# Patient Record
Sex: Female | Born: 1950 | Race: White | Hispanic: No | Smoking: Never smoker
Health system: Southern US, Community
[De-identification: ages and names within clinical notes are randomized; demographics above are authoritative.]

## PROBLEM LIST (undated history)

## (undated) DIAGNOSIS — T7840XA Allergy, unspecified, initial encounter: Secondary | ICD-10-CM

## (undated) DIAGNOSIS — E785 Hyperlipidemia, unspecified: Secondary | ICD-10-CM

## (undated) DIAGNOSIS — M1990 Unspecified osteoarthritis, unspecified site: Secondary | ICD-10-CM

## (undated) DIAGNOSIS — I10 Essential (primary) hypertension: Secondary | ICD-10-CM

## (undated) HISTORY — DX: Allergy, unspecified, initial encounter: T78.40XA

## (undated) HISTORY — DX: Essential (primary) hypertension: I10

## (undated) HISTORY — PX: FRACTURE SURGERY: SHX138

## (undated) HISTORY — PX: TUBAL LIGATION: SHX77

## (undated) HISTORY — DX: Hyperlipidemia, unspecified: E78.5

## (undated) HISTORY — DX: Unspecified osteoarthritis, unspecified site: M19.90

---

## 2004-09-01 ENCOUNTER — Ambulatory Visit: Payer: Self-pay | Admitting: Obstetrics and Gynecology

## 2005-07-02 ENCOUNTER — Ambulatory Visit: Payer: Self-pay | Admitting: Unknown Physician Specialty

## 2005-09-22 ENCOUNTER — Ambulatory Visit: Payer: Self-pay | Admitting: Obstetrics and Gynecology

## 2006-08-23 ENCOUNTER — Encounter: Payer: Self-pay | Admitting: Internal Medicine

## 2006-09-07 ENCOUNTER — Encounter: Payer: Self-pay | Admitting: Internal Medicine

## 2006-09-27 ENCOUNTER — Ambulatory Visit: Payer: Self-pay | Admitting: Obstetrics and Gynecology

## 2007-09-29 ENCOUNTER — Ambulatory Visit: Payer: Self-pay | Admitting: Obstetrics and Gynecology

## 2008-08-21 HISTORY — PX: COLONOSCOPY: SHX174

## 2008-10-01 ENCOUNTER — Ambulatory Visit: Payer: Self-pay | Admitting: Obstetrics and Gynecology

## 2008-10-22 ENCOUNTER — Ambulatory Visit: Payer: Self-pay | Admitting: Unknown Physician Specialty

## 2009-10-14 ENCOUNTER — Ambulatory Visit: Payer: Self-pay | Admitting: Obstetrics and Gynecology

## 2010-10-16 ENCOUNTER — Ambulatory Visit: Payer: Self-pay | Admitting: Obstetrics and Gynecology

## 2011-12-09 ENCOUNTER — Ambulatory Visit: Payer: Self-pay | Admitting: Family Medicine

## 2012-12-13 ENCOUNTER — Ambulatory Visit: Payer: Self-pay | Admitting: Family Medicine

## 2013-12-03 ENCOUNTER — Ambulatory Visit: Payer: Self-pay

## 2013-12-08 ENCOUNTER — Ambulatory Visit: Payer: Self-pay | Admitting: Family Medicine

## 2013-12-20 ENCOUNTER — Ambulatory Visit: Payer: Self-pay | Admitting: Family Medicine

## 2014-03-21 ENCOUNTER — Emergency Department: Payer: Self-pay | Admitting: Emergency Medicine

## 2014-03-21 LAB — COMPREHENSIVE METABOLIC PANEL
ALBUMIN: 3.8 g/dL (ref 3.4–5.0)
ALT: 29 U/L
Alkaline Phosphatase: 128 U/L — ABNORMAL HIGH
Anion Gap: 6 — ABNORMAL LOW (ref 7–16)
BUN: 13 mg/dL (ref 7–18)
Bilirubin,Total: 0.5 mg/dL (ref 0.2–1.0)
CREATININE: 1.03 mg/dL (ref 0.60–1.30)
Calcium, Total: 9.2 mg/dL (ref 8.5–10.1)
Chloride: 109 mmol/L — ABNORMAL HIGH (ref 98–107)
Co2: 26 mmol/L (ref 21–32)
EGFR (African American): >60
EGFR (Non-African Amer.): 58 — ABNORMAL LOW
Glucose: 110 mg/dL — ABNORMAL HIGH (ref 65–99)
Osmolality: 282 (ref 275–301)
Potassium: 3.9 mmol/L (ref 3.5–5.1)
SGOT(AST): 27 U/L (ref 15–37)
SODIUM: 141 mmol/L (ref 136–145)
Total Protein: 7.5 g/dL (ref 6.4–8.2)

## 2014-03-21 LAB — CBC
HCT: 42.9 % (ref 35.0–47.0)
HGB: 14 g/dL (ref 12.0–16.0)
MCH: 30.2 pg (ref 26.0–34.0)
MCHC: 32.7 g/dL (ref 32.0–36.0)
MCV: 93 fL (ref 80–100)
Platelet: 229 10*3/uL (ref 150–440)
RBC: 4.64 10*6/uL (ref 3.80–5.20)
RDW: 13.2 % (ref 11.5–14.5)
WBC: 7.6 10*3/uL (ref 3.6–11.0)

## 2014-06-23 ENCOUNTER — Emergency Department
Admit: 2014-06-23 | Disposition: A | Discharge status: Home / Self Care | Payer: Self-pay | Admitting: Emergency Medicine

## 2014-06-23 LAB — COMPREHENSIVE METABOLIC PANEL
ALBUMIN: 4.4 g/dL
ANION GAP: 5 — AB (ref 7–16)
Alkaline Phosphatase: 128 U/L — ABNORMAL HIGH
BUN: 16 mg/dL
Bilirubin,Total: 0.6 mg/dL
CHLORIDE: 107 mmol/L
CREATININE: 0.93 mg/dL
Calcium, Total: 9.3 mg/dL
Co2: 26 mmol/L
EGFR (African American): >60
EGFR (Non-African Amer.): >60
GLUCOSE: 120 mg/dL — AB
Potassium: 3.7 mmol/L
SGOT(AST): 28 U/L
SGPT (ALT): 31 U/L
SODIUM: 138 mmol/L
TOTAL PROTEIN: 7.9 g/dL

## 2014-06-23 LAB — CBC
HCT: 41.9 % (ref 35.0–47.0)
HGB: 14.1 g/dL (ref 12.0–16.0)
MCH: 30.1 pg (ref 26.0–34.0)
MCHC: 33.6 g/dL (ref 32.0–36.0)
MCV: 90 fL (ref 80–100)
PLATELETS: 259 10*3/uL (ref 150–440)
RBC: 4.67 10*6/uL (ref 3.80–5.20)
RDW: 13.3 % (ref 11.5–14.5)
WBC: 7.9 10*3/uL (ref 3.6–11.0)

## 2014-06-23 LAB — TROPONIN I

## 2014-10-17 ENCOUNTER — Ambulatory Visit: Payer: Self-pay | Admitting: Family Medicine

## 2014-10-24 ENCOUNTER — Encounter: Payer: Self-pay | Admitting: Family Medicine

## 2014-10-24 ENCOUNTER — Ambulatory Visit (INDEPENDENT_AMBULATORY_CARE_PROVIDER_SITE_OTHER): Payer: Managed Care, Other (non HMO) | Admitting: Family Medicine

## 2014-10-24 ENCOUNTER — Other Ambulatory Visit: Payer: Self-pay

## 2014-10-24 VITALS — BP 164/90 | HR 68 | Ht 64.0 in | Wt 178.0 lb

## 2014-10-24 DIAGNOSIS — R55 Syncope and collapse: Secondary | ICD-10-CM

## 2014-10-24 DIAGNOSIS — Z823 Family history of stroke: Secondary | ICD-10-CM | POA: Diagnosis not present

## 2014-10-24 DIAGNOSIS — Z8249 Family history of ischemic heart disease and other diseases of the circulatory system: Secondary | ICD-10-CM | POA: Diagnosis not present

## 2014-10-24 DIAGNOSIS — R739 Hyperglycemia, unspecified: Secondary | ICD-10-CM | POA: Diagnosis not present

## 2014-10-24 DIAGNOSIS — E669 Obesity, unspecified: Secondary | ICD-10-CM | POA: Diagnosis not present

## 2014-10-24 DIAGNOSIS — E785 Hyperlipidemia, unspecified: Secondary | ICD-10-CM

## 2014-10-24 DIAGNOSIS — I10 Essential (primary) hypertension: Secondary | ICD-10-CM | POA: Diagnosis not present

## 2014-10-24 DIAGNOSIS — E782 Mixed hyperlipidemia: Secondary | ICD-10-CM | POA: Insufficient documentation

## 2014-10-24 HISTORY — DX: Syncope and collapse: R55

## 2014-10-24 MED ORDER — METOPROLOL SUCCINATE ER 100 MG PO TB24: 100.0000 mg | ORAL_TABLET | Freq: Every day | ORAL | Status: DC

## 2014-10-24 NOTE — Progress Notes (Signed)
Date:  10/24/2014   Name:  Heidi Powell   DOB:  [birth year]   MRN:  [id number]  PCP:  Adline Potter, MD    Chief Complaint: Establish Care   History of Present Illness:  This is a 64 y.o. female with HTN and HLD for establishment of care. Has been seeing Dr. Clemmie Krill but not covered under her insurance now. Hx HTN switched from losartan to metoprolol 6 months ago due to poor control. Hx vasovagal syncope for years, has had extensive negative cardiac w/u, only one episode since switch to metoprolol. Has taken Lipitor for many years for borderline HLD, last lipids 06/2014 ok. FH CAD/CHF in father, HTNCAD in mother, fatal CVA in older brother.  Review of Systems:  Review of Systems  Constitutional: Negative for fever and fatigue.  HENT: Negative for ear pain and sore throat.   Eyes: Negative for pain.  Respiratory: Negative for cough and shortness of breath.   Cardiovascular: Negative for chest pain and leg swelling.  Gastrointestinal: Negative for abdominal pain, diarrhea and constipation.  Endocrine: Negative for polyuria.  Genitourinary: Negative for flank pain, difficulty urinating and pelvic pain.  Musculoskeletal: Negative for back pain and neck pain.  Skin: Negative for rash.  Neurological: Negative for tremors and weakness.  Hematological: Negative for adenopathy.  Psychiatric/Behavioral: Negative for confusion, dysphoric mood and agitation.    Patient Active Problem List   Diagnosis Date Noted  . HLD (hyperlipidemia) 10/24/2014  . BP (high blood pressure) 10/24/2014  . Vasovagal syncope 10/24/2014  . Obesity (BMI 30.0-34.9) 10/24/2014    Prior to Admission medications   Medication Sig Start Date End Date Taking? Authorizing Provider  atorvastatin (LIPITOR) 10 MG tablet Take 1 tablet by mouth daily. 10/16/14  Yes Historical Provider, MD  metoprolol succinate (TOPROL XL) 100 MG 24 hr tablet Take 1 tablet (100 mg total) by mouth daily. Take with or immediately following a  meal. 10/24/14   Adline Potter, MD    Allergies  Allergen Reactions  . Penicillin V Potassium Rash    No past surgical history on file.  Social History  Substance Use Topics  . Smoking status: Never Smoker   . Smokeless tobacco: Not on file  . Alcohol Use: 0.0 oz/week    0 Standard drinks or equivalent per week    Family History  Problem Relation Age of Onset  . Hypertension Mother   . Heart disease Father   . Hypertension Father   . Stroke Brother     Medication list has been reviewed and updated.  Physical Examination: BP 164/90 mmHg  Pulse 68  Ht 5\' 4"  (1.626 m)  Wt 178 lb (80.74 kg)  BMI 30.54 kg/m2  Physical Exam  Constitutional: She is oriented to person, place, and time. She appears well-developed and well-nourished.  HENT:  Head: Normocephalic and atraumatic.  Right Ear: External ear normal.  Left Ear: External ear normal.  Nose: Nose normal.  Mouth/Throat: Oropharynx is clear and moist.  Eyes: Conjunctivae and EOM are normal. Pupils are equal, round, and reactive to light. No scleral icterus.  Neck: Neck supple. No thyromegaly present.  Cardiovascular: Normal rate, regular rhythm and normal heart sounds.  Exam reveals no gallop.   No murmur heard. Pulmonary/Chest: Effort normal and breath sounds normal. She has no wheezes. She has no rales.  Abdominal: Soft. She exhibits no distension and no mass. There is no tenderness.  Musculoskeletal: Normal range of motion. She exhibits no edema.  Lymphadenopathy:    She  has no cervical adenopathy.  Neurological: She is alert and oriented to person, place, and time. Coordination normal.  Skin: Skin is warm and dry. No rash noted.  Psychiatric: She has a normal mood and affect. Her behavior is normal.    Assessment and Plan:  1. Essential hypertension Poor control, increase metoprolol succinate from 50 to 100 mg daily - Comprehensive metabolic panel - CBC  2. Vasovagal syncope S/p extensive negative cardiac  w/u, less frequent since switch to metoprolol  3. HLD (hyperlipidemia) Unclear control, FH CAD/stroke - Lipid Profile  4. Obesity (BMI 30.0-34.9) Discussed weight loss/exercise - TSH - Vitamin D (25 hydroxy)  5. Hyperglycemia On blood work 06/2014 - HgB A1c  Return in about 4 weeks (around 11/21/2014).  Satira Anis. [facility]  10/24/2014

## 2014-10-30 NOTE — Progress Notes (Signed)
Tried to call patient and no answer or answering machine.dr

## 2014-10-31 NOTE — Progress Notes (Signed)
Please call regarding overdue labs

## 2014-11-07 LAB — COMPREHENSIVE METABOLIC PANEL
A/G RATIO: 1.8 (ref 1.1–2.5)
ALBUMIN: 4.7 g/dL (ref 3.6–4.8)
ALK PHOS: 146 IU/L — AB (ref 39–117)
ALT: 19 IU/L (ref 0–32)
AST: 21 IU/L (ref 0–40)
BILIRUBIN TOTAL: 0.4 mg/dL (ref 0.0–1.2)
BUN / CREAT RATIO: 14 (ref 11–26)
BUN: 14 mg/dL (ref 8–27)
CHLORIDE: 100 mmol/L (ref 97–108)
CO2: 21 mmol/L (ref 18–29)
Calcium: 9.6 mg/dL (ref 8.7–10.3)
Creatinine, Ser: 0.99 mg/dL (ref 0.57–1.00)
GFR calc Af Amer: 71 mL/min/{1.73_m2} (ref >59)
GFR calc non Af Amer: 61 mL/min/{1.73_m2} (ref >59)
GLUCOSE: 90 mg/dL (ref 65–99)
Globulin, Total: 2.6 g/dL (ref 1.5–4.5)
POTASSIUM: 4.1 mmol/L (ref 3.5–5.2)
Sodium: 142 mmol/L (ref 134–144)
Total Protein: 7.3 g/dL (ref 6.0–8.5)

## 2014-11-07 LAB — CBC
HEMATOCRIT: 43.1 % (ref 34.0–46.6)
Hemoglobin: 14.6 g/dL (ref 11.1–15.9)
MCH: 30.5 pg (ref 26.6–33.0)
MCHC: 33.9 g/dL (ref 31.5–35.7)
MCV: 90 fL (ref 79–97)
PLATELETS: 243 10*3/uL (ref 150–379)
RBC: 4.78 x10E6/uL (ref 3.77–5.28)
RDW: 13.8 % (ref 12.3–15.4)
WBC: 7.1 10*3/uL (ref 3.4–10.8)

## 2014-11-07 LAB — LIPID PANEL
CHOLESTEROL TOTAL: 184 mg/dL (ref 100–199)
Chol/HDL Ratio: 3.1 ratio units (ref 0.0–4.4)
HDL: 60 mg/dL (ref >39)
LDL CALC: 96 mg/dL (ref 0–99)
Triglycerides: 142 mg/dL (ref 0–149)
VLDL CHOLESTEROL CAL: 28 mg/dL (ref 5–40)

## 2014-11-07 LAB — HEMOGLOBIN A1C
ESTIMATED AVERAGE GLUCOSE: 114 mg/dL
HEMOGLOBIN A1C: 5.6 % (ref 4.8–5.6)

## 2014-11-07 LAB — TSH: TSH: 1 u[IU]/mL (ref 0.450–4.500)

## 2014-11-07 LAB — VITAMIN D 25 HYDROXY (VIT D DEFICIENCY, FRACTURES): VIT D 25 HYDROXY: 39.9 ng/mL (ref 30.0–100.0)

## 2014-12-18 ENCOUNTER — Telehealth: Payer: Self-pay

## 2014-12-18 DIAGNOSIS — E785 Hyperlipidemia, unspecified: Secondary | ICD-10-CM | POA: Insufficient documentation

## 2014-12-18 DIAGNOSIS — Z1239 Encounter for other screening for malignant neoplasm of breast: Secondary | ICD-10-CM

## 2014-12-18 DIAGNOSIS — I10 Essential (primary) hypertension: Secondary | ICD-10-CM | POA: Insufficient documentation

## 2014-12-18 NOTE — Telephone Encounter (Signed)
Patient needs you to put order in for mammogram at [facility].dr

## 2015-01-04 ENCOUNTER — Other Ambulatory Visit: Payer: Self-pay | Admitting: Family Medicine

## 2015-01-04 MED ORDER — ATORVASTATIN CALCIUM 10 MG PO TABS: 10.0000 mg | ORAL_TABLET | Freq: Every day | ORAL | Status: DC

## 2015-01-07 ENCOUNTER — Other Ambulatory Visit: Payer: Self-pay

## 2015-01-07 MED ORDER — ATORVASTATIN CALCIUM 10 MG PO TABS: 10.0000 mg | ORAL_TABLET | Freq: Every day | ORAL | Status: DC

## 2015-01-22 ENCOUNTER — Telehealth: Payer: Self-pay

## 2015-01-22 ENCOUNTER — Other Ambulatory Visit: Payer: Self-pay

## 2015-01-22 ENCOUNTER — Other Ambulatory Visit: Payer: Self-pay | Admitting: Family Medicine

## 2015-01-22 MED ORDER — METOPROLOL SUCCINATE ER 100 MG PO TB24: 100.0000 mg | ORAL_TABLET | Freq: Every day | ORAL | Status: DC

## 2015-01-22 NOTE — Telephone Encounter (Signed)
Sent to [facility] 

## 2015-01-22 NOTE — Telephone Encounter (Signed)
Already sent.

## 2015-01-30 ENCOUNTER — Ambulatory Visit: Payer: Self-pay

## 2015-02-05 ENCOUNTER — Ambulatory Visit
Admission: RE | Admit: 2015-02-05 | Discharge: 2015-02-05 | Disposition: A | Discharge status: Home / Self Care | Payer: Managed Care, Other (non HMO) | Source: Ambulatory Visit | Attending: Family Medicine | Admitting: Family Medicine

## 2015-02-05 DIAGNOSIS — Z1231 Encounter for screening mammogram for malignant neoplasm of breast: Secondary | ICD-10-CM | POA: Diagnosis not present

## 2015-02-05 DIAGNOSIS — Z1239 Encounter for other screening for malignant neoplasm of breast: Secondary | ICD-10-CM

## 2015-04-11 ENCOUNTER — Ambulatory Visit (INDEPENDENT_AMBULATORY_CARE_PROVIDER_SITE_OTHER): Payer: Managed Care, Other (non HMO) | Admitting: Family Medicine

## 2015-04-11 ENCOUNTER — Encounter: Payer: Self-pay | Admitting: Family Medicine

## 2015-04-11 VITALS — BP 146/96 | HR 73 | Temp 98.7°F | Ht 64.0 in | Wt 176.0 lb

## 2015-04-11 DIAGNOSIS — E669 Obesity, unspecified: Secondary | ICD-10-CM

## 2015-04-11 DIAGNOSIS — E785 Hyperlipidemia, unspecified: Secondary | ICD-10-CM

## 2015-04-11 DIAGNOSIS — I10 Essential (primary) hypertension: Secondary | ICD-10-CM | POA: Diagnosis not present

## 2015-04-11 DIAGNOSIS — J209 Acute bronchitis, unspecified: Secondary | ICD-10-CM | POA: Diagnosis not present

## 2015-04-11 DIAGNOSIS — J019 Acute sinusitis, unspecified: Secondary | ICD-10-CM | POA: Diagnosis not present

## 2015-04-11 MED ORDER — AZITHROMYCIN 250 MG PO TABS: ORAL_TABLET | ORAL | Status: DC

## 2015-04-11 NOTE — Progress Notes (Signed)
Date:  04/11/2015   Name:  Heidi Powell   DOB:  Sep 11, 1950   MRN:  [id number]  PCP:  Adline Potter, MD    Chief Complaint: Sinusitis   History of Present Illness:  This is a 65 y.o. female with 1 week hx sinus congestion, rhinorrhea, initial sore throat, cough prod green phlegm, some epistaxis. Multiple people at work sick. Feels better since Toprol XL dose increased in August. Still taking Lipitor for HLD. Weight down 2#.  Review of Systems:  Review of Systems  Constitutional: Negative for fever.  HENT: Negative for ear pain.   Respiratory: Negative for shortness of breath.   Cardiovascular: Negative for chest pain and leg swelling.  Neurological: Negative for syncope and light-headedness.    Patient Active Problem List   Diagnosis Date Noted  . HLD (hyperlipidemia) 10/24/2014  . BP (high blood pressure) 10/24/2014  . Vasovagal syncope 10/24/2014  . Obesity (BMI 30.0-34.9) 10/24/2014  . FH: CAD (coronary artery disease) 10/24/2014  . FH: stroke 10/24/2014    Prior to Admission medications   Medication Sig Start Date End Date Taking? Authorizing Provider  atorvastatin (LIPITOR) 10 MG tablet Take 1 tablet (10 mg total) by mouth daily. 01/07/15  Yes Adline Potter, MD  metoprolol succinate (TOPROL XL) 100 MG 24 hr tablet Take 1 tablet (100 mg total) by mouth daily. Take with or immediately following a meal. 01/22/15  Yes Adline Potter, MD  azithromycin (ZITHROMAX) 250 MG tablet Take two tablets today then one tablet daily for four days. 04/11/15   Adline Potter, MD    Allergies  Allergen Reactions  . Penicillin V Potassium Rash    Past Surgical History  Procedure Laterality Date  . Breast biopsy Right     neg    Social History  Substance Use Topics  . Smoking status: Never Smoker   . Smokeless tobacco: None  . Alcohol Use: 0.0 oz/week    0 Standard drinks or equivalent per week    Family History  Problem Relation Age of Onset  . Hypertension Mother   .  Heart disease Father   . Hypertension Father   . Stroke Brother     Medication list has been reviewed and updated.  Physical Examination: BP 146/96 mmHg  Pulse 73  Temp(Src) 98.7 F (37.1 C) (Oral)  Ht 5\' 4"  (1.626 m)  Wt 176 lb (79.833 kg)  BMI 30.20 kg/m2  SpO2 97%  Physical Exam  Constitutional: She appears well-developed and well-nourished.  HENT:  Right Ear: External ear normal.  Left Ear: External ear normal.  Nose: Nose normal.  Mouth/Throat: Oropharynx is clear and moist.  TM's clear B max sinus tenderness  Neck: Neck supple.  Pulmonary/Chest: Effort normal and breath sounds normal.  Lymphadenopathy:    She has no cervical adenopathy.  Neurological: She is alert.  Skin: Skin is warm and dry.  Psychiatric: She has a normal mood and affect. Her behavior is normal.  Nursing note and vitals reviewed.   Assessment and Plan:  1. Acute bronchitis, unspecified organism Zpak, call if sxs worsen/persist  2. Acute sinusitis, recurrence not specified, unspecified location As above  3. Essential hypertension BP elevated today due to illness, recheck one month  4. HLD (hyperlipidemia) Well controlled on Lipitor (LDL 96 in August)  5. Obesity Sl improved, cont exercise/weight loss  Return in about 4 weeks (around 05/09/2015).  Satira Anis. [facility]  04/11/2015

## 2015-05-27 ENCOUNTER — Encounter: Payer: Self-pay | Admitting: Family Medicine

## 2015-05-27 ENCOUNTER — Ambulatory Visit (INDEPENDENT_AMBULATORY_CARE_PROVIDER_SITE_OTHER): Payer: Managed Care, Other (non HMO) | Admitting: Family Medicine

## 2015-05-27 VITALS — BP 140/82 | HR 64 | Resp 16 | Ht 64.0 in | Wt 180.0 lb

## 2015-05-27 DIAGNOSIS — R319 Hematuria, unspecified: Secondary | ICD-10-CM

## 2015-05-27 DIAGNOSIS — R350 Frequency of micturition: Secondary | ICD-10-CM

## 2015-05-27 DIAGNOSIS — E669 Obesity, unspecified: Secondary | ICD-10-CM

## 2015-05-27 DIAGNOSIS — I10 Essential (primary) hypertension: Secondary | ICD-10-CM

## 2015-05-27 DIAGNOSIS — E785 Hyperlipidemia, unspecified: Secondary | ICD-10-CM

## 2015-05-27 LAB — POCT URINALYSIS DIPSTICK
BILIRUBIN UA: NEGATIVE
Glucose, UA: NEGATIVE
KETONES UA: NEGATIVE
Nitrite, UA: NEGATIVE
PH UA: 6
SPEC GRAV UA: 1.02
Urobilinogen, UA: 0.2

## 2015-05-27 NOTE — Progress Notes (Signed)
Date:  05/27/2015   Name:  Heidi Powell   DOB:  1950-12-22   MRN:  [id number]  PCP:  Adline Potter, MD    Chief Complaint: Hypertension   History of Present Illness:  This is a 65 y.o. female for f/u HTN and HLD. Has chronic urinary frequency daytime and nighttime though bothers mostly at night, had been placed on oxybutynin 5 mg tid by previous PMD but does not seem to help much though not using regularly. Bronchitis sxs from last visit have resolved.   Review of Systems:  Review of Systems  Constitutional: Negative for fever.  Respiratory: Negative for shortness of breath.   Cardiovascular: Negative for chest pain and leg swelling.  Genitourinary: Negative for dysuria, urgency, hematuria and flank pain.  Neurological: Negative for syncope and light-headedness.    Patient Active Problem List   Diagnosis Date Noted  . Benign essential HTN 12/18/2014  . HLD (hyperlipidemia) 10/24/2014  . Vasovagal syncope 10/24/2014  . Obesity (BMI 30.0-34.9) 10/24/2014  . FH: CAD (coronary artery disease) 10/24/2014  . FH: stroke 10/24/2014    Prior to Admission medications   Medication Sig Start Date End Date Taking? Authorizing Provider  atorvastatin (LIPITOR) 10 MG tablet Take 1 tablet (10 mg total) by mouth daily. 01/07/15  Yes Adline Potter, MD  metoprolol succinate (TOPROL XL) 100 MG 24 hr tablet Take 1 tablet (100 mg total) by mouth daily. Take with or immediately following a meal. 01/22/15  Yes Adline Potter, MD  oxybutynin (DITROPAN) 5 MG tablet Take 10 mg by mouth at bedtime.   Yes Historical Provider, MD    Allergies  Allergen Reactions  . Penicillin V Potassium Rash    Past Surgical History  Procedure Laterality Date  . Breast biopsy Right     neg    Social History  Substance Use Topics  . Smoking status: Never Smoker   . Smokeless tobacco: None  . Alcohol Use: 0.0 oz/week    0 Standard drinks or equivalent per week    Family History  Problem Relation Age  of Onset  . Hypertension Mother   . Heart disease Father   . Hypertension Father   . Stroke Brother     Medication list has been reviewed and updated.  Physical Examination: BP 140/82 mmHg  Pulse 64  Resp 16  Ht 5\' 4"  (1.626 m)  Wt 180 lb (81.647 kg)  BMI 30.88 kg/m2  SpO2 97%  Physical Exam  Constitutional: She appears well-developed and well-nourished.  Cardiovascular: Normal rate, regular rhythm and normal heart sounds.   Pulmonary/Chest: Effort normal and breath sounds normal.  Musculoskeletal: She exhibits no edema.  Neurological: She is alert.  Skin: Skin is warm and dry.  Psychiatric: She has a normal mood and affect. Her behavior is normal.  Nursing note and vitals reviewed.   Assessment and Plan:  1. Urinary frequency UA shows SG 1.020, mod blood, tr prot, tr leuk; UTI vs.irritable bladder vs. incomplete emptying, urine cx sent, trial oxybutynin 10 mg qhs as nocturnal sxs most bothersome - POCT Urinalysis Dipstick  2. Hematuria Consider urology referral if persists - Urine culture  3. Benign essential HTN Well controlled on metoprolol  4. HLD (hyperlipidemia) Well controlled on Lipitor (LDL 96 in August)  5. Obesity (BMI 30.0-34.9) Exercise/weight loss discussed  Return in about 6 months (around 11/27/2015).  Satira Anis. [facility]  05/27/2015

## 2015-05-29 ENCOUNTER — Telehealth: Payer: Self-pay

## 2015-05-29 LAB — URINE CULTURE

## 2015-05-29 LAB — PLEASE NOTE

## 2015-05-29 NOTE — Telephone Encounter (Signed)
Called to check on Urine Culture and 3 day test so they will send result Thursday.

## 2015-05-30 NOTE — Addendum Note (Signed)
Addended by: Adline Potter on: 05/30/2015 09:13 AM   Modules accepted: Orders, SmartSet

## 2015-06-07 ENCOUNTER — Encounter: Payer: Self-pay | Admitting: Urology

## 2015-06-07 ENCOUNTER — Ambulatory Visit (INDEPENDENT_AMBULATORY_CARE_PROVIDER_SITE_OTHER): Payer: Managed Care, Other (non HMO) | Admitting: Urology

## 2015-06-07 VITALS — BP 162/100 | HR 70 | Ht 64.0 in | Wt 180.2 lb

## 2015-06-07 DIAGNOSIS — R350 Frequency of micturition: Secondary | ICD-10-CM

## 2015-06-07 DIAGNOSIS — R3129 Other microscopic hematuria: Secondary | ICD-10-CM

## 2015-06-07 NOTE — Progress Notes (Signed)
Pt not seen today

## 2015-06-08 LAB — URINALYSIS, COMPLETE
Bilirubin, UA: NEGATIVE
GLUCOSE, UA: NEGATIVE
KETONES UA: NEGATIVE
NITRITE UA: NEGATIVE
PH UA: 5.5 (ref 5.0–7.5)
Protein, UA: NEGATIVE
Specific Gravity, UA: 1.01 (ref 1.005–1.030)
Urobilinogen, Ur: 0.2 mg/dL (ref 0.2–1.0)

## 2015-06-08 LAB — MICROSCOPIC EXAMINATION

## 2015-12-05 ENCOUNTER — Encounter: Payer: Self-pay | Admitting: Internal Medicine

## 2015-12-31 ENCOUNTER — Encounter: Payer: Self-pay | Admitting: Family Medicine

## 2016-01-07 ENCOUNTER — Other Ambulatory Visit: Payer: Self-pay | Admitting: Internal Medicine

## 2016-01-07 MED ORDER — METOPROLOL SUCCINATE ER 100 MG PO TB24: 100.0000 mg | ORAL_TABLET | Freq: Every day | ORAL | 3 refills | Status: DC

## 2016-01-10 ENCOUNTER — Other Ambulatory Visit: Payer: Self-pay | Admitting: Internal Medicine

## 2016-01-10 MED ORDER — ATORVASTATIN CALCIUM 10 MG PO TABS
10.0000 mg | ORAL_TABLET | Freq: Every day | ORAL | 3 refills | Status: DC
Start: 2016-01-10 — End: 2016-12-28

## 2016-01-21 ENCOUNTER — Ambulatory Visit (INDEPENDENT_AMBULATORY_CARE_PROVIDER_SITE_OTHER): Payer: Managed Care, Other (non HMO) | Admitting: Internal Medicine

## 2016-01-21 ENCOUNTER — Encounter: Payer: Self-pay | Admitting: Internal Medicine

## 2016-01-21 VITALS — BP 132/62 | HR 68 | Ht 64.0 in | Wt 180.0 lb

## 2016-01-21 DIAGNOSIS — I10 Essential (primary) hypertension: Secondary | ICD-10-CM | POA: Diagnosis not present

## 2016-01-21 DIAGNOSIS — E782 Mixed hyperlipidemia: Secondary | ICD-10-CM | POA: Diagnosis not present

## 2016-01-21 DIAGNOSIS — Z1231 Encounter for screening mammogram for malignant neoplasm of breast: Secondary | ICD-10-CM | POA: Diagnosis not present

## 2016-01-21 DIAGNOSIS — Z0000 Encounter for general adult medical examination without abnormal findings: Secondary | ICD-10-CM | POA: Diagnosis not present

## 2016-01-21 DIAGNOSIS — Z23 Encounter for immunization: Secondary | ICD-10-CM | POA: Diagnosis not present

## 2016-01-21 DIAGNOSIS — N393 Stress incontinence (female) (male): Secondary | ICD-10-CM | POA: Diagnosis not present

## 2016-01-21 DIAGNOSIS — Z1159 Encounter for screening for other viral diseases: Secondary | ICD-10-CM

## 2016-01-21 DIAGNOSIS — Z1239 Encounter for other screening for malignant neoplasm of breast: Secondary | ICD-10-CM

## 2016-01-21 LAB — POCT URINALYSIS DIPSTICK
Bilirubin, UA: NEGATIVE
Blood, UA: NEGATIVE
Glucose, UA: NEGATIVE
Ketones, UA: NEGATIVE
LEUKOCYTES UA: NEGATIVE
Nitrite, UA: NEGATIVE
PROTEIN UA: NEGATIVE
Spec Grav, UA: 1.015
UROBILINOGEN UA: 0.2
pH, UA: 6

## 2016-01-21 MED ORDER — MIRABEGRON ER 25 MG PO TB24: 25.0000 mg | ORAL_TABLET | Freq: Every day | ORAL | 5 refills | Status: DC

## 2016-01-21 NOTE — Progress Notes (Signed)
Date:  01/21/2016   Name:  Heidi Powell   DOB:  08-21-1950   MRN:  [id number]  Previous patient of Dr. Vicente Masson.  Prefers a female MD and needs CPX with Pap.  Chief Complaint: Annual Exam (pap needed.) Heidi Powell is a 65 y.o. female who presents today for her Complete Annual Exam. She feels well. She reports exercising intermittently. She reports she is sleeping poorly due to frequent urination.  Hypertension  This is a chronic problem. The current episode started more than 1 year ago. The problem is unchanged. The problem is controlled. Pertinent negatives include no chest pain, headaches, palpitations or shortness of breath.  Hyperlipidemia  This is a chronic problem. The problem is controlled. Pertinent negatives include no chest pain or shortness of breath. Current antihyperlipidemic treatment includes statins.  Insomnia  Primary symptoms: sleep disturbance, no frequent awakening.  The problem occurs nightly. The problem is unchanged. Treatments tried: otc sleep aid, melatonin. The treatment provided mild relief.  Urinary Frequency   This is a chronic problem. The problem has been unchanged. The patient is experiencing no pain. There has been no fever. She is not sexually active. Associated symptoms include frequency and urgency. Pertinent negatives include no chills or vomiting. Treatments tried: Ditropan prescribed by Urology was not beneficial.     Review of Systems  Constitutional: Negative for chills, fatigue and fever.  HENT: Negative for congestion, hearing loss, tinnitus, trouble swallowing and voice change.   Eyes: Negative for visual disturbance.  Respiratory: Negative for cough, chest tightness, shortness of breath and wheezing.   Cardiovascular: Negative for chest pain, palpitations and leg swelling.  Gastrointestinal: Negative for abdominal pain, constipation, diarrhea and vomiting.  Endocrine: Negative for polydipsia and polyuria.  Genitourinary:  Positive for frequency and urgency. Negative for dysuria, genital sores, vaginal bleeding and vaginal discharge.  Musculoskeletal: Negative for arthralgias, gait problem and joint swelling.  Skin: Negative for color change and rash.  Neurological: Negative for dizziness, tremors, light-headedness and headaches.  Hematological: Negative for adenopathy. Does not bruise/bleed easily.  Psychiatric/Behavioral: Positive for sleep disturbance. Negative for dysphoric mood. The patient has insomnia. The patient is not nervous/anxious.     Patient Active Problem List   Diagnosis Date Noted  . Benign essential HTN 12/18/2014  . HLD (hyperlipidemia) 10/24/2014  . Vasovagal syncope 10/24/2014  . Obesity (BMI 30.0-34.9) 10/24/2014  . FH: CAD (coronary artery disease) 10/24/2014  . FH: stroke 10/24/2014    Prior to Admission medications   Medication Sig Start Date End Date Taking? Authorizing Provider  atorvastatin (LIPITOR) 10 MG tablet Take 1 tablet (10 mg total) by mouth daily. 01/10/16  Yes Glean Hess, MD  metoprolol succinate (TOPROL XL) 100 MG 24 hr tablet Take 1 tablet (100 mg total) by mouth daily. Take with or immediately following a meal. 01/07/16  Yes Glean Hess, MD  oxybutynin (DITROPAN) 5 MG tablet Take 10 mg by mouth at bedtime. Reported on 06/07/2015    Historical Provider, MD    Allergies  Allergen Reactions  . Penicillin V Potassium Rash    Past Surgical History:  Procedure Laterality Date  . BREAST BIOPSY Right    neg    Social History  Substance Use Topics  . Smoking status: Never Smoker  . Smokeless tobacco: Never Used  . Alcohol use 0.0 oz/week     Medication list has been reviewed and updated.   Physical Exam  Constitutional: She is oriented to person, place, and time.  She appears well-developed and well-nourished. No distress.  HENT:  Head: Normocephalic and atraumatic.  Right Ear: Tympanic membrane and ear canal normal.  Left Ear: Tympanic  membrane and ear canal normal.  Nose: Right sinus exhibits no maxillary sinus tenderness. Left sinus exhibits no maxillary sinus tenderness.  Mouth/Throat: Uvula is midline and oropharynx is clear and moist.  Eyes: Conjunctivae and EOM are normal. Right eye exhibits no discharge. Left eye exhibits no discharge. No scleral icterus.  Neck: Normal range of motion. Carotid bruit is not present. No erythema present. No thyromegaly present.  Cardiovascular: Normal rate, regular rhythm, normal heart sounds and normal pulses.   Pulmonary/Chest: Effort normal. No respiratory distress. She has no wheezes. Right breast exhibits no mass, no nipple discharge, no skin change and no tenderness. Left breast exhibits no mass, no nipple discharge, no skin change and no tenderness.  Abdominal: Soft. Bowel sounds are normal. There is no hepatosplenomegaly. There is no tenderness. There is no CVA tenderness.  Genitourinary: Uterus normal. There is no tenderness, lesion or injury on the right labia. There is no tenderness, lesion or injury on the left labia. Cervix exhibits no motion tenderness, no discharge and no friability. Right adnexum displays no mass, no tenderness and no fullness. Left adnexum displays no mass, no tenderness and no fullness. There is erythema (mild atrophy - no lesions) in the vagina. No tenderness in the vagina. No vaginal discharge found.  Genitourinary Comments: Thin prep pap obtained  Musculoskeletal: Normal range of motion.  Lymphadenopathy:    She has no cervical adenopathy.    She has no axillary adenopathy.  Neurological: She is alert and oriented to person, place, and time. She has normal reflexes. No cranial nerve deficit or sensory deficit.  Skin: Skin is warm, dry and intact. No rash noted.  Psychiatric: She has a normal mood and affect. Her speech is normal and behavior is normal. Thought content normal.  Nursing note and vitals reviewed.   BP 132/62   Pulse 68   Ht 5\' 4"  (1.626  m)   Wt 180 lb (81.6 kg)   BMI 30.90 kg/m   Assessment and Plan: 1. Annual physical exam Normal exam - encourage regular exercise Maximal does of Melatonin at HS to help with sleep - Pap IG and HPV (high risk) DNA detection - POCT urinalysis dipstick - TSH  2. Breast cancer screening Pt to schedule Mammogram at [place]; Future  3. Benign essential HTN controlled - CBC with Differential/Platelet - Comprehensive metabolic panel  4. Mixed hyperlipidemia On statin therapy - Lipid panel  5. Female stress incontinence Trial of myrbetriq  - mirabegron ER (MYRBETRIQ) 25 MG TB24 tablet; Take 1 tablet (25 mg total) by mouth daily after supper.  Dispense: 30 tablet; Refill: 5  6. Need for hepatitis C screening test - Hepatitis C antibody  7. Encounter for immunization - Flu Vaccine QUAD 36+ mos IM   Halina Maidens, MD [facility]  01/21/2016

## 2016-01-21 NOTE — Patient Instructions (Signed)
Breast Self-Awareness Introduction Breast self-awareness means being familiar with how your breasts look and feel. It involves checking your breasts regularly and reporting any changes to your health care provider. Practicing breast self-awareness is important. A change in your breasts can be a sign of a serious medical problem. Being familiar with how your breasts look and feel allows you to find any problems early, when treatment is more likely to be successful. All women should practice breast self-awareness, including women who have had breast implants. How to do a breast self-exam One way to learn what is normal for your breasts and whether your breasts are changing is to do a breast self-exam. To do a breast self-exam: Look for Changes  1. Remove all the clothing above your waist. 2. Stand in front of a mirror in a room with good lighting. 3. Put your hands on your hips. 4. Push your hands firmly downward. 5. Compare your breasts in the mirror. Look for differences between them (asymmetry), such as:  Differences in shape.  Differences in size.  Puckers, dips, and bumps in one breast and not the other. 6. Look at each breast for changes in your skin, such as:  Redness.  Scaly areas. 7. Look for changes in your nipples, such as:  Discharge.  Bleeding.  Dimpling.  Redness.  A change in position. Feel for Changes  Carefully feel your breasts for lumps and changes. It is best to do this while lying on your back on the floor and again while sitting or standing in the shower or tub with soapy water on your skin. Feel each breast in the following way:  Place the arm on the side of the breast you are examining above your head.  Feel your breast with the other hand.  Start in the nipple area and make  inch (2 cm) overlapping circles to feel your breast. Use the pads of your three middle fingers to do this. Apply light pressure, then medium pressure, then firm pressure. The  light pressure will allow you to feel the tissue closest to the skin. The medium pressure will allow you to feel the tissue that is a little deeper. The firm pressure will allow you to feel the tissue close to the ribs.  Continue the overlapping circles, moving downward over the breast until you feel your ribs below your breast.  Move one finger-width toward the center of the body. Continue to use the  inch (2 cm) overlapping circles to feel your breast as you move slowly up toward your collarbone.  Continue the up and down exam using all three pressures until you reach your armpit. Write Down What You Find  Write down what is normal for each breast and any changes that you find. Keep a written record with breast changes or normal findings for each breast. By writing this information down, you do not need to depend only on memory for size, tenderness, or location. Write down where you are in your menstrual cycle, if you are still menstruating. If you are having trouble noticing differences in your breasts, do not get discouraged. With time you will become more familiar with the variations in your breasts and more comfortable with the exam. How often should I examine my breasts? Examine your breasts every month. If you are breastfeeding, the best time to examine your breasts is after a feeding or after using a breast pump. If you menstruate, the best time to examine your breasts is 5-7 days after your  period is over. During your period, your breasts are lumpier, and it may be more difficult to notice changes. When should I see my health care provider? See your health care provider if you notice:  A change in shape or size of your breasts or nipples.  A change in the skin of your breast or nipples, such as a reddened or scaly area.  Unusual discharge from your nipples.  A lump or thick area that was not there before.  Pain in your breasts.  Anything that concerns you. This information is not  intended to replace advice given to you by your health care provider. Make sure you discuss any questions you have with your health care provider. Document Released: 02/23/2005 Document Revised: 08/01/2015 Document Reviewed: 01/13/2015  2017 [employer]

## 2016-01-22 LAB — COMPREHENSIVE METABOLIC PANEL
A/G RATIO: 1.6 (ref 1.2–2.2)
ALBUMIN: 4.6 g/dL (ref 3.6–4.8)
ALK PHOS: 130 IU/L — AB (ref 39–117)
ALT: 18 IU/L (ref 0–32)
AST: 20 IU/L (ref 0–40)
BILIRUBIN TOTAL: 0.6 mg/dL (ref 0.0–1.2)
BUN / CREAT RATIO: 13 (ref 12–28)
BUN: 13 mg/dL (ref 8–27)
CHLORIDE: 98 mmol/L (ref 96–106)
CO2: 23 mmol/L (ref 18–29)
Calcium: 9.6 mg/dL (ref 8.7–10.3)
Creatinine, Ser: 1.04 mg/dL — ABNORMAL HIGH (ref 0.57–1.00)
GFR calc non Af Amer: 57 mL/min/{1.73_m2} — ABNORMAL LOW (ref >59)
GFR, EST AFRICAN AMERICAN: 66 mL/min/{1.73_m2} (ref >59)
GLOBULIN, TOTAL: 2.9 g/dL (ref 1.5–4.5)
GLUCOSE: 88 mg/dL (ref 65–99)
POTASSIUM: 4.2 mmol/L (ref 3.5–5.2)
SODIUM: 140 mmol/L (ref 134–144)
Total Protein: 7.5 g/dL (ref 6.0–8.5)

## 2016-01-22 LAB — CBC WITH DIFFERENTIAL/PLATELET
BASOS ABS: 0 10*3/uL (ref 0.0–0.2)
BASOS: 1 %
EOS (ABSOLUTE): 0.1 10*3/uL (ref 0.0–0.4)
Eos: 2 %
HEMATOCRIT: 43.3 % (ref 34.0–46.6)
HEMOGLOBIN: 14.7 g/dL (ref 11.1–15.9)
Immature Grans (Abs): 0 10*3/uL (ref 0.0–0.1)
Immature Granulocytes: 0 %
Lymphocytes Absolute: 2.5 10*3/uL (ref 0.7–3.1)
Lymphs: 34 %
MCH: 31.2 pg (ref 26.6–33.0)
MCHC: 33.9 g/dL (ref 31.5–35.7)
MCV: 92 fL (ref 79–97)
MONOCYTES: 7 %
Monocytes Absolute: 0.5 10*3/uL (ref 0.1–0.9)
NEUTROS ABS: 4.3 10*3/uL (ref 1.4–7.0)
Neutrophils: 56 %
Platelets: 245 10*3/uL (ref 150–379)
RBC: 4.71 x10E6/uL (ref 3.77–5.28)
RDW: 13.4 % (ref 12.3–15.4)
WBC: 7.4 10*3/uL (ref 3.4–10.8)

## 2016-01-22 LAB — TSH: TSH: 0.764 u[IU]/mL (ref 0.450–4.500)

## 2016-01-22 LAB — LIPID PANEL
CHOLESTEROL TOTAL: 201 mg/dL — AB (ref 100–199)
Chol/HDL Ratio: 3.5 ratio units (ref 0.0–4.4)
HDL: 57 mg/dL (ref >39)
LDL Calculated: 102 mg/dL — ABNORMAL HIGH (ref 0–99)
Triglycerides: 210 mg/dL — ABNORMAL HIGH (ref 0–149)
VLDL Cholesterol Cal: 42 mg/dL — ABNORMAL HIGH (ref 5–40)

## 2016-01-22 LAB — HEPATITIS C ANTIBODY

## 2016-01-24 LAB — PAP IG AND HPV HIGH-RISK
HPV, high-risk: NEGATIVE
PAP SMEAR COMMENT: 0

## 2016-02-11 ENCOUNTER — Encounter: Payer: Self-pay | Admitting: Internal Medicine

## 2016-02-24 ENCOUNTER — Ambulatory Visit
Admission: RE | Admit: 2016-02-24 | Discharge: 2016-02-24 | Disposition: A | Discharge status: Home / Self Care | Payer: Managed Care, Other (non HMO) | Source: Ambulatory Visit | Attending: Internal Medicine | Admitting: Internal Medicine

## 2016-02-24 DIAGNOSIS — Z1231 Encounter for screening mammogram for malignant neoplasm of breast: Secondary | ICD-10-CM | POA: Diagnosis not present

## 2016-02-24 DIAGNOSIS — Z23 Encounter for immunization: Secondary | ICD-10-CM

## 2016-02-24 DIAGNOSIS — Z1239 Encounter for other screening for malignant neoplasm of breast: Secondary | ICD-10-CM

## 2016-03-11 ENCOUNTER — Ambulatory Visit (INDEPENDENT_AMBULATORY_CARE_PROVIDER_SITE_OTHER): Payer: Managed Care, Other (non HMO) | Admitting: Internal Medicine

## 2016-03-11 ENCOUNTER — Encounter: Payer: Self-pay | Admitting: Internal Medicine

## 2016-03-11 VITALS — BP 142/92 | HR 78 | Temp 97.6°F | Ht 64.0 in | Wt 183.0 lb

## 2016-03-11 DIAGNOSIS — H65193 Other acute nonsuppurative otitis media, bilateral: Secondary | ICD-10-CM

## 2016-03-11 DIAGNOSIS — N393 Stress incontinence (female) (male): Secondary | ICD-10-CM

## 2016-03-11 DIAGNOSIS — J0100 Acute maxillary sinusitis, unspecified: Secondary | ICD-10-CM | POA: Diagnosis not present

## 2016-03-11 MED ORDER — OXYBUTYNIN CHLORIDE ER 10 MG PO TB24: 10.0000 mg | ORAL_TABLET | Freq: Every day | ORAL | 5 refills | Status: DC

## 2016-03-11 MED ORDER — AZITHROMYCIN 250 MG PO TABS: ORAL_TABLET | ORAL | 0 refills | Status: DC

## 2016-03-11 MED ORDER — FLUTICASONE PROPIONATE 50 MCG/ACT NA SUSP: 2.0000 | Freq: Every day | NASAL | 6 refills | Status: DC

## 2016-03-11 NOTE — Progress Notes (Signed)
Date:  03/11/2016   Name:  Lynesha Krahn   DOB:  1950-04-30   MRN:  [id number]   Chief Complaint: Ear Pain and Nasal Congestion URI   This is a new problem. The current episode started in the past 7 days. The problem has been gradually worsening. There has been no fever. Associated symptoms include congestion, ear pain, headaches and a plugged ear sensation. Pertinent negatives include no abdominal pain, chest pain, coughing, sore throat or wheezing.      Review of Systems  Constitutional: Negative for chills, fatigue and fever.  HENT: Positive for congestion, ear pain, postnasal drip and sinus pressure. Negative for sore throat and trouble swallowing.   Respiratory: Negative for cough, chest tightness, shortness of breath and wheezing.   Cardiovascular: Negative for chest pain and leg swelling.  Gastrointestinal: Negative for abdominal pain.  Neurological: Positive for headaches. Negative for dizziness.    Patient Active Problem List   Diagnosis Date Noted  . Female stress incontinence 01/21/2016  . Benign essential HTN 12/18/2014  . HLD (hyperlipidemia) 10/24/2014  . Vasovagal syncope 10/24/2014  . Obesity (BMI 30.0-34.9) 10/24/2014  . FH: CAD (coronary artery disease) 10/24/2014  . FH: stroke 10/24/2014    Prior to Admission medications   Medication Sig Start Date End Date Taking? Authorizing Provider  atorvastatin (LIPITOR) 10 MG tablet Take 1 tablet (10 mg total) by mouth daily. 01/10/16  Yes Glean Hess, MD  metoprolol succinate (TOPROL XL) 100 MG 24 hr tablet Take 1 tablet (100 mg total) by mouth daily. Take with or immediately following a meal. 01/07/16  Yes Glean Hess, MD  mirabegron ER (MYRBETRIQ) 25 MG TB24 tablet Take 1 tablet (25 mg total) by mouth daily after supper. 01/21/16  Yes Glean Hess, MD    Allergies  Allergen Reactions  . Penicillin V Potassium Rash    Past Surgical History:  Procedure Laterality Date  . BREAST BIOPSY  Right    neg  . COLONOSCOPY  08/21/2008   normal    Social History  Substance Use Topics  . Smoking status: Never Smoker  . Smokeless tobacco: Never Used  . Alcohol use 0.0 oz/week     Medication list has been reviewed and updated.   Physical Exam  Constitutional: She is oriented to person, place, and time. She appears well-developed and well-nourished.  HENT:  Right Ear: External ear and ear canal normal. Tympanic membrane is erythematous and retracted.  Left Ear: External ear and ear canal normal. Tympanic membrane is erythematous and retracted.  Nose: Right sinus exhibits maxillary sinus tenderness and frontal sinus tenderness. Left sinus exhibits maxillary sinus tenderness and frontal sinus tenderness.  Mouth/Throat: Uvula is midline and mucous membranes are normal. No oral lesions. Posterior oropharyngeal erythema present. No oropharyngeal exudate.  Cardiovascular: Normal rate, regular rhythm and normal heart sounds.   Pulmonary/Chest: Breath sounds normal. She has no wheezes. She has no rales.  Lymphadenopathy:    She has no cervical adenopathy.  Neurological: She is alert and oriented to person, place, and time.    BP (!) 142/92   Pulse 78   Temp 97.6 F (36.4 C)   Ht 5\' 4"  (1.626 m)   Wt 183 lb (83 kg)   SpO2 98%   BMI 31.41 kg/m   Assessment and Plan: 1. Acute non-recurrent maxillary sinusitis Continue over the counter sinus medication - azithromycin (ZITHROMAX Z-PAK) 250 MG tablet; UAD  Dispense: 6 each; Refill: 0 - fluticasone (FLONASE) 50  MCG/ACT nasal spray; Place 2 sprays into both nostrils daily.  Dispense: 16 g; Refill: 6  2. Other acute nonsuppurative otitis media of both ears, recurrence not specified - azithromycin (ZITHROMAX Z-PAK) 250 MG tablet; UAD  Dispense: 6 each; Refill: 0  3. Female stress incontinence Could not afford Myrbetriq - oxybutynin (DITROPAN-XL) 10 MG 24 hr tablet; Take 1 tablet (10 mg total) by mouth at bedtime.  Dispense: 30  tablet; Refill: [place], MD [facility]  03/11/2016

## 2016-03-16 ENCOUNTER — Encounter: Payer: Self-pay | Admitting: Internal Medicine

## 2016-03-16 ENCOUNTER — Ambulatory Visit (INDEPENDENT_AMBULATORY_CARE_PROVIDER_SITE_OTHER): Payer: Managed Care, Other (non HMO) | Admitting: Internal Medicine

## 2016-03-16 VITALS — BP 156/88 | HR 88 | Temp 97.6°F | Ht 64.0 in | Wt 181.0 lb

## 2016-03-16 DIAGNOSIS — H6993 Unspecified Eustachian tube disorder, bilateral: Secondary | ICD-10-CM

## 2016-03-16 MED ORDER — PREDNISONE 10 MG PO TABS: ORAL_TABLET | ORAL | 0 refills | Status: DC

## 2016-03-16 NOTE — Progress Notes (Signed)
Date:  03/16/2016   Name:  Heidi Powell   DOB:  [birth year]   MRN:  [id number]   Chief Complaint: Ear Pain (Pt stated ears still the same but not hurting.) Otalgia   This is a chronic problem. The current episode started 1 to 4 weeks ago. The problem occurs constantly. The problem has been unchanged. There has been no fever. Pertinent negatives include no rhinorrhea. She has tried antibiotics for the symptoms. The treatment provided mild relief.  Seen 6 days ago for sinusitis and treated with Zpak.  Ears are still blocked and she hears her pulse at night.  There is minimal pain. She continues on Flonase and Tylenol severe cold.    Review of Systems  Constitutional: Negative for chills, fatigue and fever.  HENT: Positive for ear pain. Negative for congestion, rhinorrhea, sinus pain and sinus pressure.   Respiratory: Negative for chest tightness and shortness of breath.   Cardiovascular: Negative for chest pain.    Patient Active Problem List   Diagnosis Date Noted  . Female stress incontinence 01/21/2016  . Benign essential HTN 12/18/2014  . HLD (hyperlipidemia) 10/24/2014  . Vasovagal syncope 10/24/2014  . Obesity (BMI 30.0-34.9) 10/24/2014  . FH: CAD (coronary artery disease) 10/24/2014  . FH: stroke 10/24/2014    Prior to Admission medications   Medication Sig Start Date End Date Taking? Authorizing Provider  atorvastatin (LIPITOR) 10 MG tablet Take 1 tablet (10 mg total) by mouth daily. 01/10/16  Yes Glean Hess, MD  azithromycin (ZITHROMAX Z-PAK) 250 MG tablet UAD 03/11/16  Yes Glean Hess, MD  fluticasone [facility]) 50 MCG/ACT nasal spray Place 2 sprays into both nostrils daily. 03/11/16  Yes Glean Hess, MD  metoprolol succinate (TOPROL XL) 100 MG 24 hr tablet Take 1 tablet (100 mg total) by mouth daily. Take with or immediately following a meal. 01/07/16  Yes Glean Hess, MD  oxybutynin (DITROPAN-XL) 10 MG 24 hr tablet Take 1 tablet (10 mg total) by  mouth at bedtime. 03/11/16  Yes Glean Hess, MD  Phenylephrine-APAP-Guaifenesin (TYLENOL COLD & HEAD PO) Take by mouth.   Yes Historical Provider, MD    Allergies  Allergen Reactions  . Penicillin V Potassium Rash    Past Surgical History:  Procedure Laterality Date  . BREAST BIOPSY Right    neg  . COLONOSCOPY  08/21/2008   normal    Social History  Substance Use Topics  . Smoking status: Never Smoker  . Smokeless tobacco: Never Used  . Alcohol use 0.0 oz/week     Medication list has been reviewed and updated.   Physical Exam  Constitutional: She is oriented to person, place, and time. She appears well-developed. No distress.  HENT:  Head: Normocephalic and atraumatic.  Right Ear: Tympanic membrane is erythematous and retracted.  Left Ear: Tympanic membrane is erythematous and retracted.  Nose: Right sinus exhibits no maxillary sinus tenderness and no frontal sinus tenderness. Left sinus exhibits no maxillary sinus tenderness and no frontal sinus tenderness.  Mouth/Throat: No posterior oropharyngeal erythema.  Cardiovascular: Normal rate, regular rhythm and normal heart sounds.   Pulmonary/Chest: Effort normal and breath sounds normal. No respiratory distress.  Musculoskeletal: Normal range of motion.  Neurological: She is alert and oriented to person, place, and time.  Skin: Skin is warm and dry. No rash noted.  Psychiatric: She has a normal mood and affect. Her behavior is normal. Thought content normal.  Nursing note and vitals reviewed.  BP (!) 156/88   Pulse 88   Temp 97.6 F (36.4 C)   Ht 5\' 4"  (1.626 m)   Wt 181 lb (82.1 kg)   SpO2 96%   BMI 31.07 kg/m   Assessment and Plan: 1. Disorder of both eustachian tubes TMs appear slightly less erythematous Zithromax is still in her system for 4 more days Call at the end of the week if still problematic - predniSONE (DELTASONE) 10 MG tablet; Take 6 on day 1, 5 on day 2, 4 on day 3, 3 on day 4, 2 on day 5  and 1 on day 1 then stop.  Dispense: 21 tablet; Refill: 0   Halina Maidens, MD [facility]  03/16/2016

## 2016-03-20 ENCOUNTER — Other Ambulatory Visit: Payer: Self-pay | Admitting: Internal Medicine

## 2016-03-20 DIAGNOSIS — J0100 Acute maxillary sinusitis, unspecified: Secondary | ICD-10-CM

## 2016-03-20 DIAGNOSIS — H65193 Other acute nonsuppurative otitis media, bilateral: Secondary | ICD-10-CM

## 2016-03-20 MED ORDER — AZITHROMYCIN 250 MG PO TABS: ORAL_TABLET | ORAL | 0 refills | Status: DC

## 2016-07-20 ENCOUNTER — Ambulatory Visit (INDEPENDENT_AMBULATORY_CARE_PROVIDER_SITE_OTHER): Payer: Managed Care, Other (non HMO) | Admitting: Internal Medicine

## 2016-07-20 ENCOUNTER — Encounter: Payer: Self-pay | Admitting: Internal Medicine

## 2016-07-20 VITALS — BP 142/88 | HR 68 | Ht 64.0 in | Wt 180.2 lb

## 2016-07-20 DIAGNOSIS — I10 Essential (primary) hypertension: Secondary | ICD-10-CM

## 2016-07-20 DIAGNOSIS — E782 Mixed hyperlipidemia: Secondary | ICD-10-CM

## 2016-07-20 DIAGNOSIS — S42253A Displaced fracture of greater tuberosity of unspecified humerus, initial encounter for closed fracture: Secondary | ICD-10-CM | POA: Insufficient documentation

## 2016-07-20 MED ORDER — LOSARTAN POTASSIUM 50 MG PO TABS: 50.0000 mg | ORAL_TABLET | Freq: Every day | ORAL | 3 refills | Status: DC

## 2016-07-20 NOTE — Patient Instructions (Signed)
DASH Eating Plan DASH stands for "Dietary Approaches to Stop Hypertension." The DASH eating plan is a healthy eating plan that has been shown to reduce high blood pressure (hypertension). It may also reduce your risk for type 2 diabetes, heart disease, and stroke. The DASH eating plan may also help with weight loss. What are tips for following this plan? General guidelines  Avoid eating more than 2,300 mg (milligrams) of salt (sodium) a day. If you have hypertension, you may need to reduce your sodium intake to 1,500 mg a day.  Limit alcohol intake to no more than 1 drink a day for nonpregnant women and 2 drinks a day for men. One drink equals 12 oz of beer, 5 oz of wine, or 1 oz of hard liquor.  Work with your health care provider to maintain a healthy body weight or to lose weight. Ask what an ideal weight is for you.  Get at least 30 minutes of exercise that causes your heart to beat faster (aerobic exercise) most days of the week. Activities may include walking, swimming, or biking.  Work with your health care provider or diet and nutrition specialist (dietitian) to adjust your eating plan to your individual calorie needs. Reading food labels  Check food labels for the amount of sodium per serving. Choose foods with less than 5 percent of the Daily Value of sodium. Generally, foods with less than 300 mg of sodium per serving fit into this eating plan.  To find whole grains, look for the word "whole" as the first word in the ingredient list. Shopping  Buy products labeled as "low-sodium" or "no salt added."  Buy fresh foods. Avoid canned foods and premade or frozen meals. Cooking  Avoid adding salt when cooking. Use salt-free seasonings or herbs instead of table salt or sea salt. Check with your health care provider or pharmacist before using salt substitutes.  Do not fry foods. Cook foods using healthy methods such as baking, boiling, grilling, and broiling instead.  Cook with  heart-healthy oils, such as olive, canola, soybean, or sunflower oil. Meal planning   Eat a balanced diet that includes: ? 5 or more servings of fruits and vegetables each day. At each meal, try to fill half of your plate with fruits and vegetables. ? Up to 6-8 servings of whole grains each day. ? Less than 6 oz of lean meat, poultry, or fish each day. A 3-oz serving of meat is about the same size as a deck of cards. One egg equals 1 oz. ? 2 servings of low-fat dairy each day. ? A serving of nuts, seeds, or beans 5 times each week. ? Heart-healthy fats. Healthy fats called Omega-3 fatty acids are found in foods such as flaxseeds and coldwater fish, like sardines, salmon, and mackerel.  Limit how much you eat of the following: ? Canned or prepackaged foods. ? Food that is high in trans fat, such as fried foods. ? Food that is high in saturated fat, such as fatty meat. ? Sweets, desserts, sugary drinks, and other foods with added sugar. ? Full-fat dairy products.  Do not salt foods before eating.  Try to eat at least 2 vegetarian meals each week.  Eat more home-cooked food and less restaurant, buffet, and fast food.  When eating at a restaurant, ask that your food be prepared with less salt or no salt, if possible. What foods are recommended? The items listed may not be a complete list. Talk with your dietitian about what   dietary choices are best for you. Grains Whole-grain or whole-wheat bread. Whole-grain or whole-wheat pasta. Brown rice. Oatmeal. Quinoa. Bulgur. Whole-grain and low-sodium cereals. Pita bread. Low-fat, low-sodium crackers. Whole-wheat flour tortillas. Vegetables Fresh or frozen vegetables (raw, steamed, roasted, or grilled). Low-sodium or reduced-sodium tomato and vegetable juice. Low-sodium or reduced-sodium tomato sauce and tomato paste. Low-sodium or reduced-sodium canned vegetables. Fruits All fresh, dried, or frozen fruit. Canned fruit in natural juice (without  added sugar). Meat and other protein foods Skinless chicken or turkey. Ground chicken or turkey. Pork with fat trimmed off. Fish and seafood. Egg whites. Dried beans, peas, or lentils. Unsalted nuts, nut butters, and seeds. Unsalted canned beans. Lean cuts of beef with fat trimmed off. Low-sodium, lean deli meat. Dairy Low-fat (1%) or fat-free (skim) milk. Fat-free, low-fat, or reduced-fat cheeses. Nonfat, low-sodium ricotta or cottage cheese. Low-fat or nonfat yogurt. Low-fat, low-sodium cheese. Fats and oils Soft margarine without trans fats. Vegetable oil. Low-fat, reduced-fat, or light mayonnaise and salad dressings (reduced-sodium). Canola, safflower, olive, soybean, and sunflower oils. Avocado. Seasoning and other foods Herbs. Spices. Seasoning mixes without salt. Unsalted popcorn and pretzels. Fat-free sweets. What foods are not recommended? The items listed may not be a complete list. Talk with your dietitian about what dietary choices are best for you. Grains Baked goods made with fat, such as croissants, muffins, or some breads. Dry pasta or rice meal packs. Vegetables Creamed or fried vegetables. Vegetables in a cheese sauce. Regular canned vegetables (not low-sodium or reduced-sodium). Regular canned tomato sauce and paste (not low-sodium or reduced-sodium). Regular tomato and vegetable juice (not low-sodium or reduced-sodium). Pickles. Olives. Fruits Canned fruit in a light or heavy syrup. Fried fruit. Fruit in cream or butter sauce. Meat and other protein foods Fatty cuts of meat. Ribs. Fried meat. Bacon. Sausage. Bologna and other processed lunch meats. Salami. Fatback. Hotdogs. Bratwurst. Salted nuts and seeds. Canned beans with added salt. Canned or smoked fish. Whole eggs or egg yolks. Chicken or turkey with skin. Dairy Whole or 2% milk, cream, and half-and-half. Whole or full-fat cream cheese. Whole-fat or sweetened yogurt. Full-fat cheese. Nondairy creamers. Whipped toppings.  Processed cheese and cheese spreads. Fats and oils Butter. Stick margarine. Lard. Shortening. Ghee. Bacon fat. Tropical oils, such as coconut, palm kernel, or palm oil. Seasoning and other foods Salted popcorn and pretzels. Onion salt, garlic salt, seasoned salt, table salt, and sea salt. Worcestershire sauce. Tartar sauce. Barbecue sauce. Teriyaki sauce. Soy sauce, including reduced-sodium. Steak sauce. Canned and packaged gravies. Fish sauce. Oyster sauce. Cocktail sauce. Horseradish that you find on the shelf. Ketchup. Mustard. Meat flavorings and tenderizers. Bouillon cubes. Hot sauce and Tabasco sauce. Premade or packaged marinades. Premade or packaged taco seasonings. Relishes. Regular salad dressings. Where to find more information:  National Heart, Lung, and Blood Institute: www.nhlbi.nih.gov  American Heart Association: www.heart.org Summary  The DASH eating plan is a healthy eating plan that has been shown to reduce high blood pressure (hypertension). It may also reduce your risk for type 2 diabetes, heart disease, and stroke.  With the DASH eating plan, you should limit salt (sodium) intake to 2,300 mg a day. If you have hypertension, you may need to reduce your sodium intake to 1,500 mg a day.  When on the DASH eating plan, aim to eat more fresh fruits and vegetables, whole grains, lean proteins, low-fat dairy, and heart-healthy fats.  Work with your health care provider or diet and nutrition specialist (dietitian) to adjust your eating plan to your individual   calorie needs. This information is not intended to replace advice given to you by your health care provider. Make sure you discuss any questions you have with your health care provider. Document Released: 02/12/2011 Document Revised: 02/17/2016 Document Reviewed: 02/17/2016 Elsevier Interactive Patient Education  2017 Elsevier Inc.  

## 2016-07-20 NOTE — Progress Notes (Signed)
Date:  07/20/2016   Name:  Heidi Powell   DOB:  1950/04/18   MRN:  [id number]   Chief Complaint: Hypertension Hypertension  This is a chronic problem. The problem is controlled. Pertinent negatives include no chest pain, headaches, palpitations or shortness of breath. Past treatments include beta blockers. The current treatment provides moderate improvement.  Hyperlipidemia  This is a chronic problem. Pertinent negatives include no chest pain, myalgias or shortness of breath. Current antihyperlipidemic treatment includes statins.      Review of Systems  Constitutional: Negative for appetite change, fatigue, fever and unexpected weight change.  HENT: Negative for tinnitus and trouble swallowing.   Eyes: Negative for visual disturbance.  Respiratory: Negative for cough, chest tightness and shortness of breath.   Cardiovascular: Negative for chest pain, palpitations and leg swelling.  Gastrointestinal: Negative for abdominal pain.  Genitourinary: Negative for dysuria and hematuria.  Musculoskeletal: Positive for back pain (recent fall with strain - improving). Negative for arthralgias and myalgias.  Neurological: Negative for tremors, numbness and headaches.  Psychiatric/Behavioral: Negative for dysphoric mood and sleep disturbance.    Patient Active Problem List   Diagnosis Date Noted  . Female stress incontinence 01/21/2016  . Benign essential HTN 12/18/2014  . Mixed hyperlipidemia 10/24/2014  . Vasovagal syncope 10/24/2014  . Obesity (BMI 30.0-34.9) 10/24/2014  . FH: CAD (coronary artery disease) 10/24/2014  . FH: stroke 10/24/2014    Prior to Admission medications   Medication Sig Start Date End Date Taking? Authorizing Provider  atorvastatin (LIPITOR) 10 MG tablet Take 1 tablet (10 mg total) by mouth daily. 01/10/16   Glean Hess, MD  metoprolol succinate (TOPROL XL) 100 MG 24 hr tablet Take 1 tablet (100 mg total) by mouth daily. Take with or immediately  following a meal. 01/07/16   Glean Hess, MD  Phenylephrine-APAP-Guaifenesin (TYLENOL COLD & HEAD PO) Take by mouth.    [provider]       Glean Hess, MD    Allergies  Allergen Reactions  . Penicillin V Potassium Rash    Past Surgical History:  Procedure Laterality Date  . BREAST BIOPSY Right    neg  . COLONOSCOPY  08/21/2008   normal    Social History  Substance Use Topics  . Smoking status: Never Smoker  . Smokeless tobacco: Never Used  . Alcohol use 0.0 oz/week     Medication list has been reviewed and updated.   Physical Exam  Constitutional: She is oriented to person, place, and time. She appears well-developed. No distress.  HENT:  Head: Normocephalic and atraumatic.  Neck: Carotid bruit is not present.  Cardiovascular: Normal rate, regular rhythm and normal heart sounds.   Pulmonary/Chest: Effort normal and breath sounds normal. No respiratory distress. She has no wheezes.  Musculoskeletal: She exhibits no edema.  Neurological: She is alert and oriented to person, place, and time.  Skin: Skin is warm and dry. No rash noted.  Psychiatric: She has a normal mood and affect. Her speech is normal and behavior is normal. Thought content normal.  Nursing note and vitals reviewed.   BP (!) 142/88 (BP Location: Right Arm, Patient Position: Sitting, Cuff Size: Normal)   Pulse 68   Ht 5\' 4"  (1.626 m)   Wt 180 lb 3.2 oz (81.7 kg)   SpO2 97%   BMI 30.93 kg/m   Assessment and Plan: 1. Benign essential HTN Not controlled Add losartan 50 mg Check BP at home several times per month  2. Mixed hyperlipidemia On statin therapy   Meds ordered this encounter  Medications  . losartan (COZAAR) 50 MG tablet    Sig: Take 1 tablet (50 mg total) by mouth daily.    Dispense:  90 tablet    Refill:  [place], MD [facility]  07/20/2016

## 2016-12-28 ENCOUNTER — Other Ambulatory Visit: Payer: Self-pay

## 2016-12-28 MED ORDER — ATORVASTATIN CALCIUM 10 MG PO TABS: 10.0000 mg | ORAL_TABLET | Freq: Every day | ORAL | 0 refills | Status: DC

## 2016-12-28 MED ORDER — METOPROLOL SUCCINATE ER 100 MG PO TB24: 100.0000 mg | ORAL_TABLET | Freq: Every day | ORAL | 0 refills | Status: DC

## 2017-01-06 ENCOUNTER — Other Ambulatory Visit: Payer: Self-pay

## 2017-01-06 ENCOUNTER — Other Ambulatory Visit: Payer: Self-pay | Admitting: Internal Medicine

## 2017-01-06 MED ORDER — LOSARTAN POTASSIUM 50 MG PO TABS: 50.0000 mg | ORAL_TABLET | Freq: Every day | ORAL | 3 refills | Status: DC

## 2017-01-06 MED ORDER — LOSARTAN POTASSIUM 50 MG PO TABS: 50.0000 mg | ORAL_TABLET | Freq: Every day | ORAL | 0 refills | Status: DC

## 2017-01-22 ENCOUNTER — Ambulatory Visit (INDEPENDENT_AMBULATORY_CARE_PROVIDER_SITE_OTHER): Payer: 59 | Admitting: Internal Medicine

## 2017-01-22 ENCOUNTER — Encounter: Payer: Self-pay | Admitting: Internal Medicine

## 2017-01-22 VITALS — BP 136/82 | HR 72 | Ht 64.0 in | Wt 183.0 lb

## 2017-01-22 DIAGNOSIS — E782 Mixed hyperlipidemia: Secondary | ICD-10-CM

## 2017-01-22 DIAGNOSIS — I10 Essential (primary) hypertension: Secondary | ICD-10-CM

## 2017-01-22 DIAGNOSIS — Z0000 Encounter for general adult medical examination without abnormal findings: Secondary | ICD-10-CM | POA: Diagnosis not present

## 2017-01-22 DIAGNOSIS — Z23 Encounter for immunization: Secondary | ICD-10-CM | POA: Diagnosis not present

## 2017-01-22 DIAGNOSIS — M722 Plantar fascial fibromatosis: Secondary | ICD-10-CM | POA: Insufficient documentation

## 2017-01-22 DIAGNOSIS — Z1239 Encounter for other screening for malignant neoplasm of breast: Secondary | ICD-10-CM

## 2017-01-22 LAB — POCT URINALYSIS DIPSTICK
Bilirubin, UA: NEGATIVE
GLUCOSE UA: NEGATIVE
KETONES UA: NEGATIVE
Leukocytes, UA: NEGATIVE
Nitrite, UA: NEGATIVE
Protein, UA: NEGATIVE
SPEC GRAV UA: 1.02 (ref 1.010–1.025)
Urobilinogen, UA: 0.2 E.U./dL
pH, UA: 6 (ref 5.0–8.0)

## 2017-01-22 MED ORDER — ZOSTER VAC RECOMB ADJUVANTED 50 MCG/0.5ML IM SUSR: 0.5000 mL | Freq: Once | INTRAMUSCULAR | 1 refills | Status: AC

## 2017-01-22 MED ORDER — ATORVASTATIN CALCIUM 10 MG PO TABS: 10.0000 mg | ORAL_TABLET | Freq: Every day | ORAL | 3 refills | Status: DC

## 2017-01-22 MED ORDER — MELOXICAM 15 MG PO TABS: 15.0000 mg | ORAL_TABLET | Freq: Every day | ORAL | 0 refills | Status: DC

## 2017-01-22 MED ORDER — METOPROLOL SUCCINATE ER 100 MG PO TB24: 100.0000 mg | ORAL_TABLET | Freq: Every day | ORAL | 3 refills | Status: DC

## 2017-01-22 MED ORDER — LOSARTAN POTASSIUM 50 MG PO TABS: 50.0000 mg | ORAL_TABLET | Freq: Every day | ORAL | 3 refills | Status: DC

## 2017-01-22 NOTE — Progress Notes (Signed)
Date:  01/22/2017   Name:  Heidi Powell   DOB:  October 03, 1950   MRN:  [id number]   Chief Complaint: Annual Exam (Flu Shot. Breast Exam. ) Heidi Powell is a 66 y.o. female who presents today for her Complete Annual Exam. She feels well. She reports exercising walking. She reports she is sleeping well.   Hypertension  This is a chronic problem. The problem is controlled. Pertinent negatives include no chest pain, headaches, palpitations or shortness of breath. Past treatments include angiotensin blockers. The current treatment provides significant improvement.  Hyperlipidemia  This is a chronic problem. The problem is controlled. Pertinent negatives include no chest pain or shortness of breath. Current antihyperlipidemic treatment includes statins.  Plantar fasciitis - in left foot this past summer.  Seen by podiatry and feeling better. Used mobic with good effect and would like a refill.    Review of Systems  Constitutional: Negative for chills, fatigue and fever.  HENT: Negative for congestion, hearing loss, tinnitus, trouble swallowing and voice change.   Eyes: Negative for visual disturbance.  Respiratory: Negative for cough, chest tightness, shortness of breath and wheezing.   Cardiovascular: Negative for chest pain, palpitations and leg swelling.  Gastrointestinal: Negative for abdominal pain, constipation, diarrhea and vomiting.  Endocrine: Negative for polydipsia and polyuria.  Genitourinary: Negative for dysuria, frequency, genital sores, vaginal bleeding and vaginal discharge.  Musculoskeletal: Negative for arthralgias, gait problem and joint swelling.  Skin: Negative for color change and rash.  Neurological: Negative for dizziness, tremors, light-headedness and headaches.  Hematological: Negative for adenopathy. Does not bruise/bleed easily.  Psychiatric/Behavioral: Negative for dysphoric mood and sleep disturbance. The patient is not nervous/anxious.      Patient Active Problem List   Diagnosis Date Noted  . Female stress incontinence 01/21/2016  . Benign essential HTN 12/18/2014  . Mixed hyperlipidemia 10/24/2014  . Vasovagal syncope 10/24/2014  . Obesity (BMI 30.0-34.9) 10/24/2014  . FH: CAD (coronary artery disease) 10/24/2014  . FH: stroke 10/24/2014    Prior to Admission medications   Medication Sig Start Date End Date Taking? Authorizing Provider  atorvastatin (LIPITOR) 10 MG tablet Take 1 tablet (10 mg total) by mouth daily. 12/28/16  Yes Glean Hess, MD  losartan (COZAAR) 50 MG tablet Take 1 tablet (50 mg total) by mouth daily. 01/06/17  Yes Glean Hess, MD  metoprolol succinate (TOPROL XL) 100 MG 24 hr tablet Take 1 tablet (100 mg total) by mouth daily. Take with or immediately following a meal. 12/28/16  Yes Glean Hess, MD    Allergies  Allergen Reactions  . Penicillin V Potassium Rash    Past Surgical History:  Procedure Laterality Date  . BREAST BIOPSY Right    neg  . COLONOSCOPY  08/21/2008   normal    Social History   Tobacco Use  . Smoking status: Never Smoker  . Smokeless tobacco: Never Used  Substance Use Topics  . Alcohol use: Yes    Alcohol/week: 0.0 oz  . Drug use: No     Medication list has been reviewed and updated.  PHQ 2/9 Scores 01/22/2017  PHQ - 2 Score 0    Physical Exam  Constitutional: She is oriented to person, place, and time. She appears well-developed and well-nourished. No distress.  HENT:  Head: Normocephalic and atraumatic.  Right Ear: Tympanic membrane and ear canal normal.  Left Ear: Tympanic membrane and ear canal normal.  Nose: Right sinus exhibits no maxillary sinus tenderness. Left sinus  exhibits no maxillary sinus tenderness.  Mouth/Throat: Uvula is midline and oropharynx is clear and moist.  Eyes: Conjunctivae and EOM are normal. Right eye exhibits no discharge. Left eye exhibits no discharge. No scleral icterus.  Neck: Normal range of  motion. Carotid bruit is not present. No erythema present. No thyromegaly present.  Cardiovascular: Normal rate, regular rhythm, normal heart sounds and normal pulses.  Pulmonary/Chest: Effort normal. No respiratory distress. She has no wheezes. Right breast exhibits no mass, no nipple discharge, no skin change and no tenderness. Left breast exhibits no mass, no nipple discharge, no skin change and no tenderness.  Abdominal: Soft. Bowel sounds are normal. There is no hepatosplenomegaly. There is no tenderness. There is no CVA tenderness.  Musculoskeletal: Normal range of motion. She exhibits no edema or tenderness.  Lymphadenopathy:    She has no cervical adenopathy.    She has no axillary adenopathy.  Neurological: She is alert and oriented to person, place, and time. She has normal reflexes. No cranial nerve deficit or sensory deficit.  Skin: Skin is warm, dry and intact. No rash noted.  Psychiatric: She has a normal mood and affect. Her speech is normal and behavior is normal. Thought content normal.  Nursing note and vitals reviewed.   BP 136/82   Pulse 72   Ht 5\' 4"  (1.626 m)   Wt 183 lb (83 kg)   SpO2 97%   BMI 31.41 kg/m   Assessment and Plan: 1. Annual physical exam Continue healthy diet and exercise - POCT urinalysis dipstick  2. Breast cancer screening - MM DIGITAL SCREENING BILATERAL; Future  3. Benign essential HTN Doing well - continue current therapy - metoprolol succinate (TOPROL XL) 100 MG 24 hr tablet; Take 1 tablet (100 mg total) daily by mouth. Take with or immediately following a meal.  Dispense: 90 tablet; Refill: 3 - losartan (COZAAR) 50 MG tablet; Take 1 tablet (50 mg total) daily by mouth.  Dispense: 90 tablet; Refill: 3 - CBC with Differential/Platelet - Comprehensive metabolic panel - TSH  4. Mixed hyperlipidemia On statin - atorvastatin (LIPITOR) 10 MG tablet; Take 1 tablet (10 mg total) daily by mouth.  Dispense: 90 tablet; Refill: 3 - Lipid  panel  5. Need for influenza vaccination - Flu Vaccine QUAD 36+ mos IM - Zoster Vaccine Adjuvanted [facility]) injection; Inject 0.5 mLs once for 1 dose into the muscle.  Dispense: 0.5 mL; Refill: 1  6. Need for shingles vaccine Rx given  7. Plantar fasciitis, left Refilled Mobic to use PRN   Meds ordered this encounter  Medications  . metoprolol succinate (TOPROL XL) 100 MG 24 hr tablet    Sig: Take 1 tablet (100 mg total) daily by mouth. Take with or immediately following a meal.    Dispense:  90 tablet    Refill:  3  . losartan (COZAAR) 50 MG tablet    Sig: Take 1 tablet (50 mg total) daily by mouth.    Dispense:  90 tablet    Refill:  3  . atorvastatin (LIPITOR) 10 MG tablet    Sig: Take 1 tablet (10 mg total) daily by mouth.    Dispense:  90 tablet    Refill:  3  . meloxicam (MOBIC) 15 MG tablet    Sig: Take 1 tablet (15 mg total) daily by mouth.    Dispense:  90 tablet    Refill:  0  . Zoster Vaccine Adjuvanted [facility])) injection    Sig: Inject 0.5 mLs once for  1 dose into the muscle.    Dispense:  0.5 mL    Refill:  1    Partially dictated using Editor, commissioning. Any errors are unintentional.  Halina Maidens, MD [facility]  01/22/2017

## 2017-01-23 LAB — COMPREHENSIVE METABOLIC PANEL
A/G RATIO: 1.6 (ref 1.2–2.2)
ALT: 21 IU/L (ref 0–32)
AST: 20 IU/L (ref 0–40)
Albumin: 4.4 g/dL (ref 3.6–4.8)
Alkaline Phosphatase: 146 IU/L — ABNORMAL HIGH (ref 39–117)
BUN/Creatinine Ratio: 15 (ref 12–28)
BUN: 13 mg/dL (ref 8–27)
Bilirubin Total: 0.5 mg/dL (ref 0.0–1.2)
CALCIUM: 9.4 mg/dL (ref 8.7–10.3)
CHLORIDE: 105 mmol/L (ref 96–106)
CO2: 22 mmol/L (ref 20–29)
Creatinine, Ser: 0.88 mg/dL (ref 0.57–1.00)
GFR calc Af Amer: 80 mL/min/{1.73_m2} (ref >59)
GFR, EST NON AFRICAN AMERICAN: 70 mL/min/{1.73_m2} (ref >59)
Globulin, Total: 2.7 g/dL (ref 1.5–4.5)
Glucose: 94 mg/dL (ref 65–99)
POTASSIUM: 4.6 mmol/L (ref 3.5–5.2)
Sodium: 143 mmol/L (ref 134–144)
Total Protein: 7.1 g/dL (ref 6.0–8.5)

## 2017-01-23 LAB — LIPID PANEL
CHOL/HDL RATIO: 3.8 ratio (ref 0.0–4.4)
Cholesterol, Total: 194 mg/dL (ref 100–199)
HDL: 51 mg/dL (ref >39)
LDL Calculated: 105 mg/dL — ABNORMAL HIGH (ref 0–99)
TRIGLYCERIDES: 189 mg/dL — AB (ref 0–149)
VLDL CHOLESTEROL CAL: 38 mg/dL (ref 5–40)

## 2017-01-23 LAB — CBC WITH DIFFERENTIAL/PLATELET
Basophils Absolute: 0 10*3/uL (ref 0.0–0.2)
Basos: 0 %
EOS (ABSOLUTE): 0.1 10*3/uL (ref 0.0–0.4)
Eos: 2 %
Hematocrit: 42.1 % (ref 34.0–46.6)
Hemoglobin: 13.9 g/dL (ref 11.1–15.9)
Immature Grans (Abs): 0 10*3/uL (ref 0.0–0.1)
Immature Granulocytes: 0 %
Lymphocytes Absolute: 1.7 10*3/uL (ref 0.7–3.1)
Lymphs: 26 %
MCH: 30.8 pg (ref 26.6–33.0)
MCHC: 33 g/dL (ref 31.5–35.7)
MCV: 93 fL (ref 79–97)
Monocytes Absolute: 0.4 10*3/uL (ref 0.1–0.9)
Monocytes: 6 %
Neutrophils Absolute: 4.3 10*3/uL (ref 1.4–7.0)
Neutrophils: 66 %
Platelets: 233 10*3/uL (ref 150–379)
RBC: 4.51 x10E6/uL (ref 3.77–5.28)
RDW: 13.2 % (ref 12.3–15.4)
WBC: 6.5 10*3/uL (ref 3.4–10.8)

## 2017-01-23 LAB — TSH: TSH: 0.859 u[IU]/mL (ref 0.450–4.500)

## 2017-02-24 ENCOUNTER — Ambulatory Visit
Admission: RE | Admit: 2017-02-24 | Discharge: 2017-02-24 | Disposition: A | Discharge status: Home / Self Care | Payer: 59 | Source: Ambulatory Visit | Attending: Internal Medicine | Admitting: Internal Medicine

## 2017-02-24 DIAGNOSIS — Z1231 Encounter for screening mammogram for malignant neoplasm of breast: Secondary | ICD-10-CM | POA: Insufficient documentation

## 2017-02-24 DIAGNOSIS — Z1239 Encounter for other screening for malignant neoplasm of breast: Secondary | ICD-10-CM

## 2017-04-13 DIAGNOSIS — R69 Illness, unspecified: Secondary | ICD-10-CM | POA: Diagnosis not present

## 2017-05-27 ENCOUNTER — Ambulatory Visit: Payer: Self-pay | Admitting: Internal Medicine

## 2017-05-28 ENCOUNTER — Encounter: Payer: Self-pay | Admitting: Internal Medicine

## 2017-05-28 ENCOUNTER — Ambulatory Visit (INDEPENDENT_AMBULATORY_CARE_PROVIDER_SITE_OTHER): Payer: Medicare HMO | Admitting: Internal Medicine

## 2017-05-28 VITALS — BP 148/94 | HR 73 | Temp 98.0°F | Ht 64.0 in | Wt 179.0 lb

## 2017-05-28 DIAGNOSIS — J40 Bronchitis, not specified as acute or chronic: Secondary | ICD-10-CM | POA: Diagnosis not present

## 2017-05-28 MED ORDER — DOXYCYCLINE HYCLATE 100 MG PO TABS: 100.0000 mg | ORAL_TABLET | Freq: Two times a day (BID) | ORAL | 0 refills | Status: AC

## 2017-05-28 MED ORDER — GUAIFENESIN-CODEINE 100-10 MG/5ML PO SYRP: 5.0000 mL | ORAL_SOLUTION | Freq: Three times a day (TID) | ORAL | 0 refills | Status: DC | PRN

## 2017-05-28 NOTE — Progress Notes (Signed)
Date:  05/28/2017   Name:  Heidi Powell   DOB:  [birth year]   MRN:  [id number]   Chief Complaint: Sinusitis (X 1 week. Beach trip last week- sickness started there. Cough- chest congestion. Green production. Tried OTC Mucinex .) Cough  This is a new problem. The current episode started in the past 7 days. The problem has been unchanged. The problem occurs every few minutes. The cough is productive of sputum. Associated symptoms include headaches, nasal congestion, postnasal drip and wheezing. Pertinent negatives include no chest pain, chills, fever or shortness of breath. The symptoms are aggravated by exercise. She has tried OTC cough suppressant for the symptoms. The treatment provided mild relief.    Review of Systems  Constitutional: Negative for chills and fever.  HENT: Positive for congestion, postnasal drip and sinus pressure. Negative for trouble swallowing.   Respiratory: Positive for cough and wheezing. Negative for chest tightness and shortness of breath.   Cardiovascular: Negative for chest pain and palpitations.  Gastrointestinal: Negative for abdominal pain, diarrhea and nausea.  Musculoskeletal: Negative for arthralgias.  Neurological: Positive for headaches.    Patient Active Problem List   Diagnosis Date Noted  . Plantar fasciitis, left 01/22/2017  . Female stress incontinence 01/21/2016  . Benign essential HTN 12/18/2014  . Mixed hyperlipidemia 10/24/2014  . Vasovagal syncope 10/24/2014  . Obesity (BMI 30.0-34.9) 10/24/2014  . FH: CAD (coronary artery disease) 10/24/2014  . FH: stroke 10/24/2014    Prior to Admission medications   Medication Sig Start Date End Date Taking? Authorizing Provider  atorvastatin (LIPITOR) 10 MG tablet Take 1 tablet (10 mg total) daily by mouth. 01/22/17   Glean Hess, MD  losartan (COZAAR) 50 MG tablet Take 1 tablet (50 mg total) daily by mouth. 01/22/17   Glean Hess, MD  meloxicam (MOBIC) 15 MG tablet Take 1  tablet (15 mg total) daily by mouth. 01/22/17   Glean Hess, MD  metoprolol succinate (TOPROL XL) 100 MG 24 hr tablet Take 1 tablet (100 mg total) daily by mouth. Take with or immediately following a meal. 01/22/17   Glean Hess, MD    Allergies  Allergen Reactions  . Penicillin V Potassium Rash    Past Surgical History:  Procedure Laterality Date  . BREAST BIOPSY Right    neg  . COLONOSCOPY  08/21/2008   normal    Social History   Tobacco Use  . Smoking status: Never Smoker  . Smokeless tobacco: Never Used  Substance Use Topics  . Alcohol use: Yes    Alcohol/week: 0.0 oz  . Drug use: No     Medication list has been reviewed and updated.  PHQ 2/9 Scores 01/22/2017  PHQ - 2 Score 0    Physical Exam  Constitutional: She is oriented to person, place, and time. She appears well-developed and well-nourished.  HENT:  Right Ear: External ear and ear canal normal. Tympanic membrane is not erythematous and not retracted.  Left Ear: External ear and ear canal normal. Tympanic membrane is not erythematous and not retracted.  Nose: Right sinus exhibits maxillary sinus tenderness and frontal sinus tenderness. Left sinus exhibits maxillary sinus tenderness and frontal sinus tenderness.  Mouth/Throat: Uvula is midline and mucous membranes are normal. No oral lesions. No oropharyngeal exudate or posterior oropharyngeal erythema.  Cardiovascular: Normal rate, regular rhythm and normal heart sounds.  Pulmonary/Chest: She has no wheezes. She has rhonchi. She has no rales.  Lymphadenopathy:    She has  no cervical adenopathy.  Neurological: She is alert and oriented to person, place, and time.  Psychiatric: She has a normal mood and affect.    BP (!) 148/94   Pulse 73   Temp 98 F (36.7 C) (Oral)   Ht 5\' 4"  (1.626 m)   Wt 179 lb (81.2 kg)   SpO2 97%   BMI 30.73 kg/m   Assessment and Plan: 1. Bronchitis Continue Coricidin HBP and plain Mucinex -  guaiFENesin-codeine (ROBITUSSIN AC) 100-10 MG/5ML syrup; Take 5 mLs by mouth 3 (three) times daily as needed for cough.  Dispense: 118 mL; Refill: 0 - doxycycline (VIBRA-TABS) 100 MG tablet; Take 1 tablet (100 mg total) by mouth 2 (two) times daily for 10 days.  Dispense: 20 tablet; Refill: 0   Meds ordered this encounter  Medications  . guaiFENesin-codeine (ROBITUSSIN AC) 100-10 MG/5ML syrup    Sig: Take 5 mLs by mouth 3 (three) times daily as needed for cough.    Dispense:  118 mL    Refill:  0  . doxycycline (VIBRA-TABS) 100 MG tablet    Sig: Take 1 tablet (100 mg total) by mouth 2 (two) times daily for 10 days.    Dispense:  20 tablet    Refill:  0    Partially dictated using Editor, commissioning. Any errors are unintentional.  Halina Maidens, MD [facility]  05/28/2017

## 2017-06-24 ENCOUNTER — Ambulatory Visit (INDEPENDENT_AMBULATORY_CARE_PROVIDER_SITE_OTHER): Payer: Medicare HMO | Admitting: Internal Medicine

## 2017-06-24 ENCOUNTER — Encounter: Payer: Self-pay | Admitting: Internal Medicine

## 2017-06-24 VITALS — BP 128/88 | HR 81 | Ht 64.0 in | Wt 181.0 lb

## 2017-06-24 DIAGNOSIS — L247 Irritant contact dermatitis due to plants, except food: Secondary | ICD-10-CM

## 2017-06-24 MED ORDER — PREDNISONE 10 MG PO TABS: ORAL_TABLET | ORAL | 0 refills | Status: DC

## 2017-06-24 NOTE — Progress Notes (Signed)
Date:  06/24/2017   Name:  Heidi Powell   DOB:  02/23/1951   MRN:  [id number]   Chief Complaint: Rash (Was working in yard a week ago and noticed she had touched poison oak. Itchy rash. Rash is only on both arms. Used benadryl cooling cream and that gave some relief but rash still there. Going to [employer] in the morning.)  Rash  This is a new problem. The current episode started in the past 7 days. The problem is unchanged. The affected locations include the left arm and right arm. Pertinent negatives include no cough, fatigue, fever or shortness of breath.      Review of Systems  Constitutional: Negative for chills, fatigue and fever.  HENT: Negative for mouth sores (but loss of taste since antibiotics) and trouble swallowing.   Respiratory: Negative for cough and shortness of breath.   Cardiovascular: Negative for chest pain and palpitations.  Skin: Positive for rash.    Patient Active Problem List   Diagnosis Date Noted  . Plantar fasciitis, left 01/22/2017  . Female stress incontinence 01/21/2016  . Benign essential HTN 12/18/2014  . Mixed hyperlipidemia 10/24/2014  . Vasovagal syncope 10/24/2014  . Obesity (BMI 30.0-34.9) 10/24/2014  . FH: CAD (coronary artery disease) 10/24/2014  . FH: stroke 10/24/2014    Prior to Admission medications   Medication Sig Start Date End Date Taking? Authorizing Provider  atorvastatin (LIPITOR) 10 MG tablet Take 1 tablet (10 mg total) daily by mouth. 01/22/17  Yes Glean Hess, MD  losartan (COZAAR) 50 MG tablet Take 1 tablet (50 mg total) daily by mouth. 01/22/17  Yes Glean Hess, MD  metoprolol succinate (TOPROL XL) 100 MG 24 hr tablet Take 1 tablet (100 mg total) daily by mouth. Take with or immediately following a meal. 01/22/17  Yes Glean Hess, MD    Allergies  Allergen Reactions  . Penicillin V Potassium Rash    Past Surgical History:  Procedure Laterality Date  . BREAST BIOPSY Right    neg    . COLONOSCOPY  08/21/2008   normal    Social History   Tobacco Use  . Smoking status: Never Smoker  . Smokeless tobacco: Never Used  Substance Use Topics  . Alcohol use: Yes    Alcohol/week: 0.0 oz  . Drug use: No     Medication list has been reviewed and updated.  PHQ 2/9 Scores 01/22/2017  PHQ - 2 Score 0    Physical Exam  Constitutional: She is oriented to person, place, and time. She appears well-developed. No distress.  HENT:  Head: Normocephalic and atraumatic.  Pulmonary/Chest: Effort normal. No respiratory distress.  Musculoskeletal: Normal range of motion.  Neurological: She is alert and oriented to person, place, and time.  Skin: Skin is warm and dry. No rash noted.  Vesicular lesions on both forearms  Psychiatric: She has a normal mood and affect. Her behavior is normal. Thought content normal.    BP 128/88   Pulse 81   Ht 5\' 4"  (1.626 m)   Wt 181 lb (82.1 kg)   SpO2 97%   BMI 31.07 kg/m   Assessment and Plan: 1. Irritant contact dermatitis due to plants, except food Continue topical benadryl - predniSONE (DELTASONE) 10 MG tablet; Take 6 on day 1, 5 on day 2, 4 on day 3, 3 on day 4, 2 on day 5 and 1 on day 1 then stop.  Dispense: 21 tablet; Refill: 0  Meds ordered this encounter  Medications  . predniSONE (DELTASONE) 10 MG tablet    Sig: Take 6 on day 1, 5 on day 2, 4 on day 3, 3 on day 4, 2 on day 5 and 1 on day 1 then stop.    Dispense:  21 tablet    Refill:  0    Partially dictated using Editor, commissioning. Any errors are unintentional.  Halina Maidens, MD [facility]  06/24/2017

## 2017-07-12 ENCOUNTER — Other Ambulatory Visit: Payer: Self-pay | Admitting: Internal Medicine

## 2017-07-12 ENCOUNTER — Telehealth: Payer: Self-pay | Admitting: Internal Medicine

## 2017-07-12 DIAGNOSIS — J40 Bronchitis, not specified as acute or chronic: Secondary | ICD-10-CM

## 2017-07-12 NOTE — Telephone Encounter (Signed)
Patient would like to request another dose of doxycycline (VIBRA-TABS) 100 MG tablet Or a Z-pack. [id number] - [place], [place] DEA #:  [id number]

## 2017-07-12 NOTE — Telephone Encounter (Signed)
We do not refill Abx must be seen please

## 2017-07-13 ENCOUNTER — Encounter: Payer: Self-pay | Admitting: Internal Medicine

## 2017-07-13 ENCOUNTER — Ambulatory Visit (INDEPENDENT_AMBULATORY_CARE_PROVIDER_SITE_OTHER): Payer: Medicare HMO | Admitting: Internal Medicine

## 2017-07-13 VITALS — BP 132/82 | HR 80 | Temp 96.0°F | Resp 14 | Ht 64.0 in | Wt 181.0 lb

## 2017-07-13 DIAGNOSIS — H6690 Otitis media, unspecified, unspecified ear: Secondary | ICD-10-CM

## 2017-07-13 MED ORDER — AZITHROMYCIN 250 MG PO TABS: ORAL_TABLET | ORAL | 0 refills | Status: DC

## 2017-07-13 NOTE — Progress Notes (Signed)
Date:  07/13/2017   Name:  Heidi Powell   DOB:  03/15/1950   MRN:  [id number]   Chief Complaint: Sinusitis (treated with antibiotics in March) Sinusitis  This is a recurrent problem. The current episode started in the past 7 days. The problem has been gradually worsening since onset. There has been no fever. Associated symptoms include ear pain (and hearing loss), sinus pressure and a sore throat. Pertinent negatives include no chills, congestion, coughing, headaches, shortness of breath or swollen glands. Past treatments include oral decongestants. The treatment provided no relief.      Review of Systems  Constitutional: Negative for chills, fatigue, fever and unexpected weight change.  HENT: Positive for ear pain (and hearing loss), hearing loss, sinus pressure and sore throat. Negative for congestion.   Eyes: Negative for visual disturbance.  Respiratory: Negative for cough and shortness of breath.   Cardiovascular: Negative for chest pain and palpitations.  Gastrointestinal: Negative for diarrhea, nausea and vomiting.  Allergic/Immunologic: Negative for environmental allergies.  Neurological: Negative for headaches.    Patient Active Problem List   Diagnosis Date Noted  . Plantar fasciitis, left 01/22/2017  . Female stress incontinence 01/21/2016  . Benign essential HTN 12/18/2014  . Mixed hyperlipidemia 10/24/2014  . Vasovagal syncope 10/24/2014  . Obesity (BMI 30.0-34.9) 10/24/2014  . FH: CAD (coronary artery disease) 10/24/2014  . FH: stroke 10/24/2014    Prior to Admission medications   Medication Sig Start Date End Date Taking? Authorizing Provider  atorvastatin (LIPITOR) 10 MG tablet Take 1 tablet (10 mg total) daily by mouth. 01/22/17  Yes Glean Hess, MD  losartan (COZAAR) 50 MG tablet Take 1 tablet (50 mg total) daily by mouth. 01/22/17  Yes Glean Hess, MD  metoprolol succinate (TOPROL XL) 100 MG 24 hr tablet Take 1 tablet (100 mg total)  daily by mouth. Take with or immediately following a meal. 01/22/17  Yes Glean Hess, MD    Allergies  Allergen Reactions  . Penicillin V Potassium Rash    Past Surgical History:  Procedure Laterality Date  . BREAST BIOPSY Right    neg  . COLONOSCOPY  08/21/2008   normal    Social History   Tobacco Use  . Smoking status: Never Smoker  . Smokeless tobacco: Never Used  Substance Use Topics  . Alcohol use: Yes    Alcohol/week: 0.0 oz  . Drug use: No     Medication list has been reviewed and updated.  PHQ 2/9 Scores 01/22/2017  PHQ - 2 Score 0    Physical Exam  Constitutional: She is oriented to person, place, and time. She appears well-developed and well-nourished.  HENT:  Right Ear: External ear and ear canal normal. Tympanic membrane is erythematous and bulging.  Left Ear: External ear and ear canal normal. Tympanic membrane is erythematous and bulging.  Nose: Right sinus exhibits maxillary sinus tenderness. Right sinus exhibits no frontal sinus tenderness. Left sinus exhibits maxillary sinus tenderness. Left sinus exhibits no frontal sinus tenderness.  Mouth/Throat: Uvula is midline and mucous membranes are normal. No oral lesions. No oropharyngeal exudate or posterior oropharyngeal erythema.  Cardiovascular: Normal rate, regular rhythm and normal heart sounds.  Pulmonary/Chest: Breath sounds normal. She has no wheezes. She has no rales.  Lymphadenopathy:    She has no cervical adenopathy.  Neurological: She is alert and oriented to person, place, and time.    BP 132/82   Pulse 80   Temp (!) 96 F (35.6  C)   Resp 14   SpO2 98%   Assessment and Plan: 1. Acute otitis media, unspecified otitis media type Continue oral decongestants May need prednisone if ear pressure does not improve - azithromycin (ZITHROMAX Z-PAK) 250 MG tablet; UAD  Dispense: 6 each; Refill: 0   Meds ordered this encounter  Medications  . azithromycin (ZITHROMAX Z-PAK) 250 MG  tablet    Sig: UAD    Dispense:  6 each    Refill:  0    Partially dictated using Editor, commissioning. Any errors are unintentional.  Halina Maidens, MD [facility]  07/13/2017

## 2017-07-22 ENCOUNTER — Encounter: Payer: Self-pay | Admitting: Internal Medicine

## 2017-07-22 ENCOUNTER — Ambulatory Visit (INDEPENDENT_AMBULATORY_CARE_PROVIDER_SITE_OTHER): Payer: Medicare HMO | Admitting: Internal Medicine

## 2017-07-22 VITALS — BP 132/86 | HR 72 | Ht 65.0 in | Wt 178.0 lb

## 2017-07-22 DIAGNOSIS — R0981 Nasal congestion: Secondary | ICD-10-CM

## 2017-07-22 DIAGNOSIS — E782 Mixed hyperlipidemia: Secondary | ICD-10-CM | POA: Diagnosis not present

## 2017-07-22 DIAGNOSIS — I10 Essential (primary) hypertension: Secondary | ICD-10-CM | POA: Diagnosis not present

## 2017-07-22 NOTE — Patient Instructions (Signed)
Flonase nasal spray daily  Call in several weeks if not better for ENT referral

## 2017-07-22 NOTE — Progress Notes (Signed)
Date:  07/22/2017   Name:  Heidi Powell   DOB:  [birth year]   MRN:  [id number]   Chief Complaint: Hypertension and Hyperlipidemia Hypertension  This is a chronic problem. The problem is controlled. Pertinent negatives include no chest pain, headaches, palpitations or shortness of breath. Past treatments include angiotensin blockers and beta blockers. The current treatment provides significant improvement.  Ear Fullness   There is pain in the right ear. This is a chronic problem. The current episode started 1 to 4 weeks ago. The problem occurs constantly. There has been no fever. The patient is experiencing no pain. Associated symptoms include hearing loss. Pertinent negatives include no abdominal pain, coughing, headaches or rash.  She just completed zpak.  No pain but cant really hear out of right ear and her sense of smell and taste is decreased.    Review of Systems  Constitutional: Negative for appetite change, fatigue, fever and unexpected weight change.  HENT: Positive for hearing loss and sinus pressure. Negative for tinnitus and trouble swallowing.   Eyes: Negative for visual disturbance.  Respiratory: Negative for cough, chest tightness and shortness of breath.   Cardiovascular: Negative for chest pain, palpitations and leg swelling.  Gastrointestinal: Negative for abdominal pain.  Genitourinary: Negative for dysuria and hematuria.  Musculoskeletal: Negative for arthralgias.  Skin: Negative for rash.  Allergic/Immunologic: Negative for environmental allergies.  Neurological: Negative for tremors, numbness and headaches.  Psychiatric/Behavioral: Negative for dysphoric mood and sleep disturbance.    Patient Active Problem List   Diagnosis Date Noted  . Plantar fasciitis, left 01/22/2017  . Female stress incontinence 01/21/2016  . Benign essential HTN 12/18/2014  . Mixed hyperlipidemia 10/24/2014  . Vasovagal syncope 10/24/2014  . Obesity (BMI 30.0-34.9)  10/24/2014  . FH: CAD (coronary artery disease) 10/24/2014  . FH: stroke 10/24/2014    Prior to Admission medications   Medication Sig Start Date End Date Taking? Authorizing Provider  atorvastatin (LIPITOR) 10 MG tablet Take 1 tablet (10 mg total) daily by mouth. 01/22/17   Glean Hess, MD  losartan (COZAAR) 50 MG tablet Take 1 tablet (50 mg total) daily by mouth. 01/22/17   Glean Hess, MD  metoprolol succinate (TOPROL XL) 100 MG 24 hr tablet Take 1 tablet (100 mg total) daily by mouth. Take with or immediately following a meal. 01/22/17   Glean Hess, MD    Allergies  Allergen Reactions  . Penicillin V Potassium Rash    Past Surgical History:  Procedure Laterality Date  . BREAST BIOPSY Right    neg  . COLONOSCOPY  08/21/2008   normal    Social History   Tobacco Use  . Smoking status: Never Smoker  . Smokeless tobacco: Never Used  Substance Use Topics  . Alcohol use: Yes    Alcohol/week: 0.0 oz  . Drug use: No     Medication list has been reviewed and updated.  PHQ 2/9 Scores 01/22/2017  PHQ - 2 Score 0    Physical Exam  Constitutional: She is oriented to person, place, and time. She appears well-developed. No distress.  HENT:  Head: Normocephalic and atraumatic.  Right Ear: Tympanic membrane is retracted. Tympanic membrane is not erythematous. No middle ear effusion. Decreased hearing is noted.  Left Ear: Tympanic membrane is not erythematous and not retracted.  No middle ear effusion.  Nose: Right sinus exhibits maxillary sinus tenderness. Right sinus exhibits no frontal sinus tenderness. Left sinus exhibits maxillary sinus tenderness. Left sinus exhibits  no frontal sinus tenderness.  Mouth/Throat: Mucous membranes are normal.  Neck: Normal range of motion. Neck supple.  Cardiovascular: Normal rate, regular rhythm and normal heart sounds.  Pulmonary/Chest: Effort normal and breath sounds normal. No respiratory distress.  Musculoskeletal:  Normal range of motion.  Neurological: She is alert and oriented to person, place, and time.  Skin: Skin is warm and dry. No rash noted.  Psychiatric: She has a normal mood and affect. Her behavior is normal. Thought content normal.  Nursing note and vitals reviewed.   BP 132/86   Pulse 72   Ht 5\' 5"  (1.651 m)   Wt 178 lb (80.7 kg)   BMI 29.62 kg/m   Assessment and Plan: 1. Benign essential HTN Controlled on current medication Labs last visit were normal  2. Mixed hyperlipidemia On statin therapy  3. Sinus congestion Resume flonase Will refer to ENT if sx persist   No orders of the defined types were placed in this encounter.   Partially dictated using Editor, commissioning. Any errors are unintentional.  Halina Maidens, MD [facility]  07/22/2017

## 2017-09-27 DIAGNOSIS — L570 Actinic keratosis: Secondary | ICD-10-CM | POA: Diagnosis not present

## 2017-09-27 DIAGNOSIS — Z1283 Encounter for screening for malignant neoplasm of skin: Secondary | ICD-10-CM | POA: Diagnosis not present

## 2017-09-27 DIAGNOSIS — L918 Other hypertrophic disorders of the skin: Secondary | ICD-10-CM | POA: Diagnosis not present

## 2017-09-27 DIAGNOSIS — D1800 Hemangioma unspecified site: Secondary | ICD-10-CM | POA: Diagnosis not present

## 2017-09-27 DIAGNOSIS — Z872 Personal history of diseases of the skin and subcutaneous tissue: Secondary | ICD-10-CM | POA: Diagnosis not present

## 2017-09-27 DIAGNOSIS — L578 Other skin changes due to chronic exposure to nonionizing radiation: Secondary | ICD-10-CM | POA: Diagnosis not present

## 2017-09-27 DIAGNOSIS — L821 Other seborrheic keratosis: Secondary | ICD-10-CM | POA: Diagnosis not present

## 2017-09-27 DIAGNOSIS — B078 Other viral warts: Secondary | ICD-10-CM | POA: Diagnosis not present

## 2017-10-21 DIAGNOSIS — H25013 Cortical age-related cataract, bilateral: Secondary | ICD-10-CM | POA: Diagnosis not present

## 2017-12-17 ENCOUNTER — Other Ambulatory Visit: Payer: Self-pay

## 2017-12-17 DIAGNOSIS — Z111 Encounter for screening for respiratory tuberculosis: Secondary | ICD-10-CM

## 2017-12-20 ENCOUNTER — Other Ambulatory Visit: Payer: Self-pay

## 2017-12-20 ENCOUNTER — Ambulatory Visit: Payer: Self-pay

## 2017-12-20 DIAGNOSIS — Z111 Encounter for screening for respiratory tuberculosis: Secondary | ICD-10-CM

## 2017-12-22 ENCOUNTER — Other Ambulatory Visit: Payer: Self-pay | Admitting: Internal Medicine

## 2017-12-22 DIAGNOSIS — Z111 Encounter for screening for respiratory tuberculosis: Secondary | ICD-10-CM | POA: Diagnosis not present

## 2017-12-25 LAB — QUANTIFERON-TB GOLD PLUS
QuantiFERON Nil Value: 0.08 IU/mL
QuantiFERON TB1 Ag Value: 0.07 IU/mL
QuantiFERON TB2 Ag Value: 0.06 IU/mL
QuantiFERON-TB Gold Plus: NEGATIVE

## 2018-01-25 ENCOUNTER — Ambulatory Visit (INDEPENDENT_AMBULATORY_CARE_PROVIDER_SITE_OTHER): Payer: Medicare HMO | Admitting: Internal Medicine

## 2018-01-25 ENCOUNTER — Encounter: Payer: Self-pay | Admitting: Internal Medicine

## 2018-01-25 VITALS — BP 128/68 | HR 78 | Ht 65.0 in | Wt 178.0 lb

## 2018-01-25 DIAGNOSIS — Z1231 Encounter for screening mammogram for malignant neoplasm of breast: Secondary | ICD-10-CM | POA: Diagnosis not present

## 2018-01-25 DIAGNOSIS — Z23 Encounter for immunization: Secondary | ICD-10-CM | POA: Diagnosis not present

## 2018-01-25 DIAGNOSIS — E782 Mixed hyperlipidemia: Secondary | ICD-10-CM | POA: Diagnosis not present

## 2018-01-25 DIAGNOSIS — Z0000 Encounter for general adult medical examination without abnormal findings: Secondary | ICD-10-CM

## 2018-01-25 DIAGNOSIS — I10 Essential (primary) hypertension: Secondary | ICD-10-CM

## 2018-01-25 DIAGNOSIS — R7989 Other specified abnormal findings of blood chemistry: Secondary | ICD-10-CM | POA: Diagnosis not present

## 2018-01-25 DIAGNOSIS — E2839 Other primary ovarian failure: Secondary | ICD-10-CM | POA: Diagnosis not present

## 2018-01-25 LAB — POCT URINALYSIS DIPSTICK
BILIRUBIN UA: NEGATIVE
GLUCOSE UA: NEGATIVE
Ketones, UA: NEGATIVE
Leukocytes, UA: NEGATIVE
Nitrite, UA: NEGATIVE
Protein, UA: NEGATIVE
SPEC GRAV UA: 1.02 (ref 1.010–1.025)
Urobilinogen, UA: 0.2 E.U./dL
pH, UA: 6 (ref 5.0–8.0)

## 2018-01-25 MED ORDER — METOPROLOL SUCCINATE ER 100 MG PO TB24: 100.0000 mg | ORAL_TABLET | Freq: Every day | ORAL | 3 refills | Status: DC

## 2018-01-25 MED ORDER — ATORVASTATIN CALCIUM 10 MG PO TABS: 10.0000 mg | ORAL_TABLET | Freq: Every day | ORAL | 3 refills | Status: DC

## 2018-01-25 MED ORDER — LOSARTAN POTASSIUM 50 MG PO TABS: 50.0000 mg | ORAL_TABLET | Freq: Every day | ORAL | 3 refills | Status: DC

## 2018-01-25 NOTE — Progress Notes (Signed)
Date:  01/25/2018   Name:  Heidi Powell   DOB:  [birth year]   MRN:  [id number]   Chief Complaint: Annual Exam (Breast Exam. Reg dose flu shot. ) Heidi Powell is a 67 y.o. female who presents today for her Complete Annual Exam. She feels well. She reports exercising some. She reports she is sleeping poorly. She takes melatonin sometimes. She is due for a mammogram in December.  She is also due for a DEXA and pneumonia vaccine as well as influenza vaccine.  Hypertension  Pertinent negatives include no chest pain, headaches, palpitations or shortness of breath. Past treatments include beta blockers and angiotensin blockers. The current treatment provides significant improvement. There are no compliance problems.   Hyperlipidemia  This is a chronic problem. The problem is controlled. Pertinent negatives include no chest pain or shortness of breath. Current antihyperlipidemic treatment includes statins. The current treatment provides significant improvement of lipids. There are no compliance problems.     Review of Systems  Constitutional: Negative for chills, fatigue and fever.  HENT: Negative for congestion, hearing loss, tinnitus, trouble swallowing and voice change.   Eyes: Negative for visual disturbance.  Respiratory: Negative for cough, chest tightness, shortness of breath and wheezing.   Cardiovascular: Negative for chest pain, palpitations and leg swelling.  Gastrointestinal: Negative for abdominal pain, constipation, diarrhea and vomiting.  Endocrine: Negative for polydipsia and polyuria.  Genitourinary: Negative for dysuria, frequency, genital sores, vaginal bleeding and vaginal discharge.  Musculoskeletal: Negative for arthralgias, gait problem and joint swelling.  Skin: Negative for color change and rash.  Neurological: Negative for dizziness, tremors, light-headedness and headaches.  Hematological: Negative for adenopathy. Does not bruise/bleed easily.    Psychiatric/Behavioral: Negative for dysphoric mood and sleep disturbance. The patient is not nervous/anxious.     Patient Active Problem List   Diagnosis Date Noted  . Plantar fasciitis, left 01/22/2017  . Female stress incontinence 01/21/2016  . Benign essential HTN 12/18/2014  . Mixed hyperlipidemia 10/24/2014  . Vasovagal syncope 10/24/2014  . Obesity (BMI 30.0-34.9) 10/24/2014  . FH: CAD (coronary artery disease) 10/24/2014  . FH: stroke 10/24/2014    Allergies  Allergen Reactions  . Penicillin V Potassium Rash    Past Surgical History:  Procedure Laterality Date  . BREAST BIOPSY Right    neg  . COLONOSCOPY  08/21/2008   normal    Social History   Tobacco Use  . Smoking status: Never Smoker  . Smokeless tobacco: Never Used  Substance Use Topics  . Alcohol use: Yes    Alcohol/week: 0.0 standard drinks  . Drug use: No     Medication list has been reviewed and updated.  Current Meds  Medication Sig  . atorvastatin (LIPITOR) 10 MG tablet Take 1 tablet (10 mg total) daily by mouth.  . losartan (COZAAR) 50 MG tablet Take 1 tablet (50 mg total) daily by mouth.  . metoprolol succinate (TOPROL XL) 100 MG 24 hr tablet Take 1 tablet (100 mg total) daily by mouth. Take with or immediately following a meal.    PHQ 2/9 Scores 01/25/2018 01/22/2017  PHQ - 2 Score 0 0    Physical Exam  Constitutional: She is oriented to person, place, and time. She appears well-developed and well-nourished. No distress.  HENT:  Head: Normocephalic and atraumatic.  Right Ear: Tympanic membrane and ear canal normal.  Left Ear: Tympanic membrane and ear canal normal.  Nose: Right sinus exhibits no maxillary sinus tenderness. Left sinus exhibits no  maxillary sinus tenderness.  Mouth/Throat: Uvula is midline and oropharynx is clear and moist.  Eyes: Conjunctivae and EOM are normal. Right eye exhibits no discharge. Left eye exhibits no discharge. No scleral icterus.  Neck: Normal range  of motion. Carotid bruit is not present. No erythema present. No thyromegaly present.  Cardiovascular: Normal rate, regular rhythm, normal heart sounds and normal pulses.  Pulmonary/Chest: Effort normal. No respiratory distress. She has no wheezes. Right breast exhibits no mass, no nipple discharge, no skin change and no tenderness. Left breast exhibits no mass, no nipple discharge, no skin change and no tenderness.  Abdominal: Soft. Bowel sounds are normal. There is no hepatosplenomegaly. There is no tenderness. There is no CVA tenderness.  Musculoskeletal: Normal range of motion.  Lymphadenopathy:    She has no cervical adenopathy.    She has no axillary adenopathy.  Neurological: She is alert and oriented to person, place, and time. She has normal reflexes. No cranial nerve deficit or sensory deficit.  Skin: Skin is warm, dry and intact. No rash noted.  Psychiatric: She has a normal mood and affect. Her speech is normal and behavior is normal. Thought content normal.  Nursing note and vitals reviewed.   BP 128/68 (BP Location: Right Arm, Patient Position: Sitting, Cuff Size: Normal)   Pulse 78   Ht 5\' 5"  (1.651 m)   Wt 178 lb (80.7 kg)   SpO2 98%   BMI 29.62 kg/m   Assessment and Plan: 1. Annual physical exam Normal exam,continue exercise - POCT urinalysis dipstick  2. Encounter for screening mammogram for breast cancer At [facility]; Future  3. Benign essential HTN controlled - CBC with Differential/Platelet - Comprehensive metabolic panel - TSH  4. Mixed hyperlipidemia On statin - Lipid panel  5. Ovarian failure schedule - HM DEXA SCAN   Partially dictated using Editor, commissioning. Any errors are unintentional.  Heidi Maidens, MD [facility]  01/25/2018

## 2018-01-26 LAB — LIPID PANEL
Chol/HDL Ratio: 4.3 ratio (ref 0.0–4.4)
Cholesterol, Total: 199 mg/dL (ref 100–199)
HDL: 46 mg/dL (ref >39)
LDL Calculated: 108 mg/dL — ABNORMAL HIGH (ref 0–99)
Triglycerides: 224 mg/dL — ABNORMAL HIGH (ref 0–149)
VLDL CHOLESTEROL CAL: 45 mg/dL — AB (ref 5–40)

## 2018-01-26 LAB — CBC WITH DIFFERENTIAL/PLATELET
BASOS ABS: 0 10*3/uL (ref 0.0–0.2)
Basos: 1 %
EOS (ABSOLUTE): 0.1 10*3/uL (ref 0.0–0.4)
Eos: 1 %
Hematocrit: 42.1 % (ref 34.0–46.6)
Hemoglobin: 14.3 g/dL (ref 11.1–15.9)
Immature Grans (Abs): 0 10*3/uL (ref 0.0–0.1)
Immature Granulocytes: 0 %
LYMPHS ABS: 1.6 10*3/uL (ref 0.7–3.1)
LYMPHS: 21 %
MCH: 30.6 pg (ref 26.6–33.0)
MCHC: 34 g/dL (ref 31.5–35.7)
MCV: 90 fL (ref 79–97)
MONOS ABS: 0.4 10*3/uL (ref 0.1–0.9)
Monocytes: 6 %
Neutrophils Absolute: 5.3 10*3/uL (ref 1.4–7.0)
Neutrophils: 71 %
PLATELETS: 254 10*3/uL (ref 150–450)
RBC: 4.68 x10E6/uL (ref 3.77–5.28)
RDW: 12.6 % (ref 12.3–15.4)
WBC: 7.4 10*3/uL (ref 3.4–10.8)

## 2018-01-26 LAB — COMPREHENSIVE METABOLIC PANEL
A/G RATIO: 1.8 (ref 1.2–2.2)
ALT: 26 IU/L (ref 0–32)
AST: 27 IU/L (ref 0–40)
Albumin: 4.6 g/dL (ref 3.6–4.8)
Alkaline Phosphatase: 150 IU/L — ABNORMAL HIGH (ref 39–117)
BILIRUBIN TOTAL: 0.4 mg/dL (ref 0.0–1.2)
BUN/Creatinine Ratio: 14 (ref 12–28)
BUN: 14 mg/dL (ref 8–27)
CO2: 20 mmol/L (ref 20–29)
Calcium: 9.8 mg/dL (ref 8.7–10.3)
Chloride: 104 mmol/L (ref 96–106)
Creatinine, Ser: 0.97 mg/dL (ref 0.57–1.00)
GFR, EST AFRICAN AMERICAN: 71 mL/min/{1.73_m2} (ref >59)
GFR, EST NON AFRICAN AMERICAN: 61 mL/min/{1.73_m2} (ref >59)
GLUCOSE: 102 mg/dL — AB (ref 65–99)
Globulin, Total: 2.6 g/dL (ref 1.5–4.5)
Potassium: 4.5 mmol/L (ref 3.5–5.2)
Sodium: 139 mmol/L (ref 134–144)
Total Protein: 7.2 g/dL (ref 6.0–8.5)

## 2018-01-26 LAB — TSH: TSH: 0.893 u[IU]/mL (ref 0.450–4.500)

## 2018-01-26 NOTE — Progress Notes (Signed)
Added A1C with labs.

## 2018-02-17 LAB — HGB A1C W/O EAG: Hgb A1c MFr Bld: 5.3 % (ref 4.8–5.6)

## 2018-02-17 LAB — SPECIMEN STATUS REPORT

## 2018-03-03 ENCOUNTER — Ambulatory Visit
Admission: RE | Admit: 2018-03-03 | Discharge: 2018-03-03 | Disposition: A | Discharge status: Home / Self Care | Payer: Medicare HMO | Source: Ambulatory Visit | Attending: Internal Medicine | Admitting: Internal Medicine

## 2018-03-03 DIAGNOSIS — Z1231 Encounter for screening mammogram for malignant neoplasm of breast: Secondary | ICD-10-CM | POA: Insufficient documentation

## 2018-04-04 ENCOUNTER — Ambulatory Visit: Payer: Self-pay

## 2018-05-13 DIAGNOSIS — D1800 Hemangioma unspecified site: Secondary | ICD-10-CM | POA: Diagnosis not present

## 2018-05-13 DIAGNOSIS — L821 Other seborrheic keratosis: Secondary | ICD-10-CM | POA: Diagnosis not present

## 2018-05-13 DIAGNOSIS — L820 Inflamed seborrheic keratosis: Secondary | ICD-10-CM | POA: Diagnosis not present

## 2018-05-13 DIAGNOSIS — L918 Other hypertrophic disorders of the skin: Secondary | ICD-10-CM | POA: Diagnosis not present

## 2018-07-27 ENCOUNTER — Ambulatory Visit: Payer: Self-pay | Admitting: Internal Medicine

## 2019-01-03 ENCOUNTER — Telehealth: Payer: Self-pay | Admitting: Internal Medicine

## 2019-01-03 NOTE — Telephone Encounter (Signed)
Called to schedule Medicare Annual Wellness Visit with Nurse Health Advisor, Kasey Uthus at [facility]. If patient returns call, please schedule AWV with [facility] ~ Any date on [facility] schedule (Mon or Wed)  °Questions regarding scheduling, please call  [phone] or Skype > [email or web address]  ° °Kathryn Brown  °Care Guide • [facility] °[place]   [facility]  °??[email or web address]   ??[phone]   1Skype ° ° °

## 2019-01-10 ENCOUNTER — Ambulatory Visit (INDEPENDENT_AMBULATORY_CARE_PROVIDER_SITE_OTHER): Payer: Medicare HMO

## 2019-01-10 ENCOUNTER — Other Ambulatory Visit: Payer: Self-pay

## 2019-01-10 DIAGNOSIS — Z23 Encounter for immunization: Secondary | ICD-10-CM | POA: Diagnosis not present

## 2019-01-25 ENCOUNTER — Ambulatory Visit (INDEPENDENT_AMBULATORY_CARE_PROVIDER_SITE_OTHER): Payer: Medicare HMO

## 2019-01-25 DIAGNOSIS — Z780 Asymptomatic menopausal state: Secondary | ICD-10-CM | POA: Diagnosis not present

## 2019-01-25 DIAGNOSIS — Z0000 Encounter for general adult medical examination without abnormal findings: Secondary | ICD-10-CM

## 2019-01-25 DIAGNOSIS — Z1231 Encounter for screening mammogram for malignant neoplasm of breast: Secondary | ICD-10-CM | POA: Diagnosis not present

## 2019-01-25 NOTE — Patient Instructions (Signed)
[name] , Thank you for taking time to come for your Medicare Wellness Visit. I appreciate your ongoing commitment to your health goals. Please review the following plan we discussed and let me know if I can assist you in the future.   Screening recommendations/referrals: Colonoscopy: done 08/21/2008 Mammogram: done 03/03/18. Please call [phone] to schedule your mammogram and bone density screening.  Bone Density: due Recommended yearly ophthalmology/optometry visit for glaucoma screening and checkup Recommended yearly dental visit for hygiene and checkup  Vaccinations: Influenza vaccine: done 01/10/19 Pneumococcal vaccine: due Tdap vaccine: due - please contact us if you get a cut or scrape on rust or metal Shingles vaccine: Shingrix discussed. Please contact your pharmacy for coverage information.      Advanced directives: Please bring a copy of your health care power of attorney and living will to the office at your convenience.  Conditions/risks identified: Keep up the great work!  Next appointment: Please follow up in one year for your Medicare Annual Wellness visit.     Preventive Care 66 Years and Older, Female Preventive care refers to lifestyle choices and visits with your health care provider that can promote health and wellness. What does preventive care include?  A yearly physical exam. This is also called an annual well check.  Dental exams once or twice a year.  Routine eye exams. Ask your health care provider how often you should have your eyes checked.  Personal lifestyle choices, including:  Daily care of your teeth and gums.  Regular physical activity.  Eating a healthy diet.  Avoiding tobacco and drug use.  Limiting alcohol use.  Practicing safe sex.  Taking low-dose aspirin every day.  Taking vitamin and mineral supplements as recommended by your health care provider. What happens during an annual well check? The services and screenings done  by your health care provider during your annual well check will depend on your age, overall health, lifestyle risk factors, and family history of disease. Counseling  Your health care provider may ask you questions about your:  Alcohol use.  Tobacco use.  Drug use.  Emotional well-being.  Home and relationship well-being.  Sexual activity.  Eating habits.  History of falls.  Memory and ability to understand (cognition).  Work and work Statistician.  Reproductive health. Screening  You may have the following tests or measurements:  Height, weight, and BMI.  Blood pressure.  Lipid and cholesterol levels. These may be checked every 5 years, or more frequently if you are over 61 years old.  Skin check.  Lung cancer screening. You may have this screening every year starting at age 44 if you have a 30-pack-year history of smoking and currently smoke or have quit within the past 15 years.  Fecal occult blood test (FOBT) of the stool. You may have this test every year starting at age 66.  Flexible sigmoidoscopy or colonoscopy. You may have a sigmoidoscopy every 5 years or a colonoscopy every 10 years starting at age 67.  Hepatitis C blood test.  Hepatitis B blood test.  Sexually transmitted disease (STD) testing.  Diabetes screening. This is done by checking your blood sugar (glucose) after you have not eaten for a while (fasting). You may have this done every 1-3 years.  Bone density scan. This is done to screen for osteoporosis. You may have this done starting at age 70.  Mammogram. This may be done every 1-2 years. Talk to your health care provider about how often you should have regular  mammograms. Talk with your health care provider about your test results, treatment options, and if necessary, the need for more tests. Vaccines  Your health care provider may recommend certain vaccines, such as:  Influenza vaccine. This is recommended every year.  Tetanus,  diphtheria, and acellular pertussis (Tdap, Td) vaccine. You may need a Td booster every 10 years.  Zoster vaccine. You may need this after age 27.  Pneumococcal 13-valent conjugate (PCV13) vaccine. One dose is recommended after age 30.  Pneumococcal polysaccharide (PPSV23) vaccine. One dose is recommended after age 103. Talk to your health care provider about which screenings and vaccines you need and how often you need them. This information is not intended to replace advice given to you by your health care provider. Make sure you discuss any questions you have with your health care provider. Document Released: 03/22/2015 Document Revised: 11/13/2015 Document Reviewed: 12/25/2014 Elsevier Interactive Patient Education  2017 [place] Prevention in the Home Falls can cause injuries. They can happen to people of all ages. There are many things you can do to make your home safe and to help prevent falls. What can I do on the outside of my home?  Regularly fix the edges of walkways and driveways and fix any cracks.  Remove anything that might make you trip as you walk through a door, such as a raised step or threshold.  Trim any bushes or trees on the path to your home.  Use bright outdoor lighting.  Clear any walking paths of anything that might make someone trip, such as rocks or tools.  Regularly check to see if handrails are loose or broken. Make sure that both sides of any steps have handrails.  Any raised decks and porches should have guardrails on the edges.  Have any leaves, snow, or ice cleared regularly.  Use sand or salt on walking paths during winter.  Clean up any spills in your garage right away. This includes oil or grease spills. What can I do in the bathroom?  Use night lights.  Install grab bars by the toilet and in the tub and shower. Do not use towel bars as grab bars.  Use non-skid mats or decals in the tub or shower.  If you need to sit down in  the shower, use a plastic, non-slip stool.  Keep the floor dry. Clean up any water that spills on the floor as soon as it happens.  Remove soap buildup in the tub or shower regularly.  Attach bath mats securely with double-sided non-slip rug tape.  Do not have throw rugs and other things on the floor that can make you trip. What can I do in the bedroom?  Use night lights.  Make sure that you have a light by your bed that is easy to reach.  Do not use any sheets or blankets that are too big for your bed. They should not hang down onto the floor.  Have a firm chair that has side arms. You can use this for support while you get dressed.  Do not have throw rugs and other things on the floor that can make you trip. What can I do in the kitchen?  Clean up any spills right away.  Avoid walking on wet floors.  Keep items that you use a lot in easy-to-reach places.  If you need to reach something above you, use a strong step stool that has a grab bar.  Keep electrical cords out of the way.  Do not use floor polish or wax that makes floors slippery. If you must use wax, use non-skid floor wax.  Do not have throw rugs and other things on the floor that can make you trip. What can I do with my stairs?  Do not leave any items on the stairs.  Make sure that there are handrails on both sides of the stairs and use them. Fix handrails that are broken or loose. Make sure that handrails are as long as the stairways.  Check any carpeting to make sure that it is firmly attached to the stairs. Fix any carpet that is loose or worn.  Avoid having throw rugs at the top or bottom of the stairs. If you do have throw rugs, attach them to the floor with carpet tape.  Make sure that you have a light switch at the top of the stairs and the bottom of the stairs. If you do not have them, ask someone to add them for you. What else can I do to help prevent falls?  Wear shoes that:  Do not have high  heels.  Have rubber bottoms.  Are comfortable and fit you well.  Are closed at the toe. Do not wear sandals.  If you use a stepladder:  Make sure that it is fully opened. Do not climb a closed stepladder.  Make sure that both sides of the stepladder are locked into place.  Ask someone to hold it for you, if possible.  Clearly mark and make sure that you can see:  Any grab bars or handrails.  First and last steps.  Where the edge of each step is.  Use tools that help you move around (mobility aids) if they are needed. These include:  Canes.  Walkers.  Scooters.  Crutches.  Turn on the lights when you go into a dark area. Replace any light bulbs as soon as they burn out.  Set up your furniture so you have a clear path. Avoid moving your furniture around.  If any of your floors are uneven, fix them.  If there are any pets around you, be aware of where they are.  Review your medicines with your doctor. Some medicines can make you feel dizzy. This can increase your chance of falling. Ask your doctor what other things that you can do to help prevent falls. This information is not intended to replace advice given to you by your health care provider. Make sure you discuss any questions you have with your health care provider. Document Released: 12/20/2008 Document Revised: 08/01/2015 Document Reviewed: 03/30/2014 Elsevier Interactive Patient Education  2017 [employer].

## 2019-01-25 NOTE — Progress Notes (Signed)
Subjective:   Heidi Powell is a 68 y.o. female who presents for an Initial Medicare Annual Wellness Visit.  Virtual Visit via Telephone Note  I connected with Heidi Powell on 01/25/19 at  9:20 AM EST by telephone and verified that I am speaking with the correct person using two identifiers.  Medicare Annual Wellness visit completed telephonically due to Covid-19 pandemic.  Location: Patient: home Provider: office   I discussed the limitations, risks, security and privacy concerns of performing an evaluation and management service by telephone and the availability of in person appointments. The patient expressed understanding and agreed to proceed.  Some vital signs may be absent or patient reported.   Clemetine Marker, LPN    Review of Systems      Cardiac Risk Factors include: advanced age (>2men, >75 women);hypertension;dyslipidemia     Objective:    There were no vitals filed for this visit. There is no height or weight on file to calculate BMI.  Advanced Directives 01/25/2019 04/11/2015  Does Patient Have a Medical Advance Directive? Yes Yes  Type of Paramedic of [place];Living will [place];Living will  Copy of [place] in Chart? No - copy requested -    Current Medications (verified) Outpatient Encounter Medications as of 01/25/2019  Medication Sig  . atorvastatin (LIPITOR) 10 MG tablet Take 1 tablet (10 mg total) by mouth daily.  Marland Kitchen losartan (COZAAR) 50 MG tablet Take 1 tablet (50 mg total) by mouth daily.  . metoprolol succinate (TOPROL XL) 100 MG 24 hr tablet Take 1 tablet (100 mg total) by mouth daily. Take with or immediately following a meal.  . Multiple Vitamin (MULTIVITAMIN) tablet Take 1 tablet by mouth daily.   No facility-administered encounter medications on file as of 01/25/2019.     Allergies (verified) Penicillin v potassium   History: Past Medical History:   Diagnosis Date  . Hyperlipidemia   . Hypertension    Past Surgical History:  Procedure Laterality Date  . BREAST BIOPSY Right    neg  . COLONOSCOPY  08/21/2008   normal   Family History  Problem Relation Age of Onset  . Hypertension Mother   . Renal cancer Mother   . Heart disease Father   . Hypertension Father   . Stroke Brother   . Breast cancer Neg Hx    Social History   Socioeconomic History  . Marital status: Divorced    Spouse name: Not on file  . Number of children: 1  . Years of education: Not on file  . Highest education level: Not on file  Occupational History  . Not on file  Social Needs  . Financial resource strain: Not hard at all  . Food insecurity    Worry: Never true    Inability: Never true  . Transportation needs    Medical: No    Non-medical: No  Tobacco Use  . Smoking status: Never Smoker  . Smokeless tobacco: Never Used  . Tobacco comment: smoking cessation materials not required  Substance and Sexual Activity  . Alcohol use: Yes    Alcohol/week: 2.0 standard drinks    Types: 2 Glasses of wine per week    Comment: 1-2 times per week  . Drug use: No  . Sexual activity: Not on file  Lifestyle  . Physical activity    Days per week: 7 days    Minutes per session: 30 min  . Stress: Not at all  Relationships  . Social connections    Talks on phone: More than three times a week    Gets together: Three times a week    Attends religious service: 1 to 4 times per year    Active member of club or organization: No    Attends meetings of clubs or organizations: Never    Relationship status: Divorced  Other Topics Concern  . Not on file  Social History Narrative  . Not on file    Tobacco Counseling Counseling given: No Comment: smoking cessation materials not required   Clinical Intake:  Pre-visit preparation completed: Yes  Pain : No/denies pain     Nutritional Risks: None Diabetes: No  How often do you need to have someone  help you when you read instructions, pamphlets, or other written materials from your doctor or pharmacy?: 1 - Never  Interpreter Needed?: No  Information entered by :: Clemetine Marker LPN   Activities of Daily Living In your present state of health, do you have any difficulty performing the following activities: 01/25/2019 01/25/2018  Hearing? N N  Comment declines hearing aids -  Vision? N N  Difficulty concentrating or making decisions? N N  Walking or climbing stairs? N N  Dressing or bathing? N N  Doing errands, shopping? N N  Preparing Food and eating ? N -  Using the Toilet? N -  In the past six months, have you accidently leaked urine? Y -  Comment occasional urgency -  Do you have problems with loss of bowel control? N -  Managing your Medications? N -  Managing your Finances? N -  Housekeeping or managing your Housekeeping? N -  Some recent data might be hidden     Immunizations and Health Maintenance Immunization History  Administered Date(s) Administered  . Fluad Quad(high Dose 65+) 01/10/2019  . Influenza,inj,Quad PF,6+ Mos 01/21/2016, 01/22/2017, 01/25/2018  . Influenza-Unspecified 01/21/2016   Health Maintenance Due  Topic Date Due  . PNA vac Low Risk Adult (1 of 2 - PCV13) 03/24/2017  . COLONOSCOPY  08/22/2018    Patient Care Team: Glean Hess, MD as PCP - General (Internal Medicine) Corey Skains, MD as Consulting Physician (Cardiology) Jannet Mantis, MD (Dermatology)  Indicate any recent Medical Services you may have received from other than [facility] providers in the past year (date may be approximate).     Assessment:   This is a routine wellness examination for [place].  Hearing/Vision screen  Hearing Screening   125Hz  250Hz  500Hz  1000Hz  2000Hz  3000Hz  4000Hz  6000Hz  8000Hz   Right ear:           Left ear:           Comments: Pt denies hearing difficulty  Vision Screening Comments: Annual [facility] screenings at [facility] Dr.  Mallie Mussel  Dietary issues and exercise activities discussed: Current Exercise Habits: Home exercise routine, Type of exercise: walking, Time (Minutes): 30, Frequency (Times/Week): 7, Weekly Exercise (Minutes/Week): 210, Intensity: Moderate, Exercise limited by: None identified  Goals    . Patient Stated     Pt states she would like to overall stay healthy and active.       Depression Screen PHQ 2/9 Scores 01/25/2019 01/25/2018 01/22/2017  PHQ - 2 Score 0 0 0    Fall Risk Fall Risk  01/25/2019 05/28/2017  Falls in the past year? 0 No  Number falls in past yr: 0 -  Injury with Fall? 0 -  Follow up Falls prevention discussed -  FALL RISK PREVENTION PERTAINING TO THE HOME:  Any stairs in or around the home? Yes  If so, do they handrails? No  - 2 steps from garage  Home free of loose throw rugs in walkways, pet beds, electrical cords, etc? Yes  Adequate lighting in your home to reduce risk of falls? Yes   ASSISTIVE DEVICES UTILIZED TO PREVENT FALLS:  Life alert? No  Use of a cane, walker or w/c? No  Grab bars in the bathroom? No  Shower chair or bench in shower? Yes  Elevated toilet seat or a handicapped toilet? No   DME ORDERS:  DME order needed?  No   TIMED UP AND GO:  Was the test performed? No . Telephonic visit.   Education: Fall risk prevention has been discussed.  Intervention(s) required? No   Cognitive Function: 6CIT deferred for 2020 AWV. Pt has no memory issues.         Screening Tests Health Maintenance  Topic Date Due  . PNA vac Low Risk Adult (1 of 2 - PCV13) 03/24/2017  . COLONOSCOPY  08/22/2018  . MAMMOGRAM  03/04/2019  . TETANUS/TDAP  03/23/2024  . INFLUENZA VACCINE  Completed  . DEXA SCAN  Completed  . Hepatitis C Screening  Completed    Qualifies for Shingles Vaccine? Yes . Zostavax completed several years ago per patient.  Due for Shingrix. Education has been provided regarding the importance of this vaccine. Pt has been advised to call  insurance company to determine out of pocket expense. Advised may also receive vaccine at local pharmacy or Health Dept. Verbalized acceptance and understanding.  Tdap: Although this vaccine is not a covered service during a Wellness Exam, does the patient still wish to receive this vaccine today?  No .  Education has been provided regarding the importance of this vaccine. Advised may receive this vaccine at local pharmacy or Health Dept. Aware to provide a copy of the vaccination record if obtained from local pharmacy or Health Dept. Verbalized acceptance and understanding.  Flu Vaccine: Up to date  Pneumococcal Vaccine: Due for Pneumococcal vaccine. Does the patient want to receive this vaccine today?  No . Education has been provided regarding the importance of this vaccine but still declined. Advised may receive this vaccine at local pharmacy or Health Dept. Aware to provide a copy of the vaccination record if obtained from local pharmacy or Health Dept. Verbalized acceptance and understanding.   Cancer Screenings:  Colorectal Screening: Completed 08/21/08. Repeat every 10 years. Pt would like to discuss option of cologuard vs repeat colonosocpy at appt on Monday.   Mammogram: Completed 03/03/18. Repeat every year. Ordered today. Pt provided with contact information and advised to call to schedule appt.   Bone Density: Not Completed. Ordered today. Pt provided with contact information and advised to call to schedule appt.   Lung Cancer Screening: (Low Dose CT Chest recommended if Age 34-80 years, 30 pack-year currently smoking OR have quit w/in 15years.) does not qualify.    Additional Screening:  Hepatitis C Screening: does qualify; Completed 01/21/16.  Vision Screening: Recommended annual ophthalmology exams for early detection of glaucoma and other disorders of the eye. Is the patient up to date with their annual eye exam?  Yes  Who is the provider or what is the name of the office  in which the pt attends annual eye exams? Dr. Mallie Mussel   Dental Screening: Recommended annual dental exams for proper oral hygiene  Community Resource Referral:  CRR required this  visit?  No      Plan:    I have personally reviewed and addressed the Medicare Annual Wellness questionnaire and have noted the following in the patient's chart:  A. Medical and social history B. Use of alcohol, tobacco or illicit drugs  C. Current medications and supplements D. Functional ability and status E.  Nutritional status F.  Physical activity G. Advance directives H. List of other physicians I.  Hospitalizations, surgeries, and ER visits in previous 12 months J.  [place] such as hearing and vision if needed, cognitive and depression L. Referrals and appointments   In addition, I have reviewed and discussed with patient certain preventive protocols, quality metrics, and best practice recommendations. A written personalized care plan for preventive services as well as general preventive health recommendations were provided to patient.   Signed,  Clemetine Marker, LPN  Nurse Health Advisor   Nurse Notes: none

## 2019-01-30 ENCOUNTER — Encounter: Payer: Self-pay | Admitting: Internal Medicine

## 2019-01-30 ENCOUNTER — Ambulatory Visit (INDEPENDENT_AMBULATORY_CARE_PROVIDER_SITE_OTHER): Payer: Medicare HMO | Admitting: Internal Medicine

## 2019-01-30 ENCOUNTER — Other Ambulatory Visit: Payer: Self-pay

## 2019-01-30 VITALS — BP 112/80 | HR 70 | Ht 65.0 in | Wt 173.0 lb

## 2019-01-30 DIAGNOSIS — Z23 Encounter for immunization: Secondary | ICD-10-CM

## 2019-01-30 DIAGNOSIS — Z0000 Encounter for general adult medical examination without abnormal findings: Secondary | ICD-10-CM | POA: Diagnosis not present

## 2019-01-30 DIAGNOSIS — Z13820 Encounter for screening for osteoporosis: Secondary | ICD-10-CM

## 2019-01-30 DIAGNOSIS — Z1231 Encounter for screening mammogram for malignant neoplasm of breast: Secondary | ICD-10-CM

## 2019-01-30 DIAGNOSIS — E782 Mixed hyperlipidemia: Secondary | ICD-10-CM

## 2019-01-30 DIAGNOSIS — I10 Essential (primary) hypertension: Secondary | ICD-10-CM

## 2019-01-30 LAB — POCT URINALYSIS DIPSTICK
Bilirubin, UA: NEGATIVE
Blood, UA: NEGATIVE
Glucose, UA: NEGATIVE
Ketones, UA: NEGATIVE
Leukocytes, UA: NEGATIVE
Nitrite, UA: NEGATIVE
Protein, UA: NEGATIVE
Spec Grav, UA: 1.01 (ref 1.010–1.025)
Urobilinogen, UA: 0.2 E.U./dL
pH, UA: 6 (ref 5.0–8.0)

## 2019-01-30 MED ORDER — ATORVASTATIN CALCIUM 10 MG PO TABS: 10.0000 mg | ORAL_TABLET | Freq: Every day | ORAL | 3 refills | Status: DC

## 2019-01-30 MED ORDER — LOSARTAN POTASSIUM 50 MG PO TABS: 50.0000 mg | ORAL_TABLET | Freq: Every day | ORAL | 3 refills | Status: DC

## 2019-01-30 MED ORDER — METOPROLOL SUCCINATE ER 100 MG PO TB24: 100.0000 mg | ORAL_TABLET | Freq: Every day | ORAL | 3 refills | Status: DC

## 2019-01-30 NOTE — Progress Notes (Signed)
Date:  01/30/2019   Name:  Heidi Powell   DOB:  14-Apr-1950   MRN:  [id number]   Chief Complaint: Annual Exam (Breast Exam. Prevnar 13.) Heidi Powell is a 68 y.o. female who presents today for her Complete Annual Exam. She feels well. She reports exercising walking daily. She reports she is sleeping interrupted sleep but this is not new. She denies breast issues.   Mammogram 11/2017 ( ordered) DEXA - none (ordered) Colonoscopy  08/2008 Immunization History  Administered Date(s) Administered  . Fluad Quad(high Dose 65+) 01/10/2019  . Influenza,inj,Quad PF,6+ Mos 01/21/2016, 01/22/2017, 01/25/2018  . Influenza-Unspecified 01/21/2016    Hypertension This is a chronic problem. The problem is controlled. Pertinent negatives include no chest pain, headaches, palpitations or shortness of breath. Past treatments include beta blockers and angiotensin blockers. The current treatment provides significant improvement.  Hyperlipidemia The problem is controlled. Pertinent negatives include no chest pain or shortness of breath. Current antihyperlipidemic treatment includes statins. The current treatment provides significant improvement of lipids. There are no compliance problems.     Lab Results  Component Value Date   CREATININE 0.97 01/25/2018   BUN 14 01/25/2018   NA 139 01/25/2018   K 4.5 01/25/2018   CL 104 01/25/2018   CO2 20 01/25/2018   Lab Results  Component Value Date   CHOL 199 01/25/2018   HDL 46 01/25/2018   LDLCALC 108 (H) 01/25/2018   TRIG 224 (H) 01/25/2018   CHOLHDL 4.3 01/25/2018   Lab Results  Component Value Date   TSH 0.893 01/25/2018   Lab Results  Component Value Date   HGBA1C 5.3 01/25/2018     Review of Systems  Constitutional: Negative for chills, fatigue and fever.  HENT: Negative for congestion, hearing loss, tinnitus, trouble swallowing and voice change.   Eyes: Negative for visual disturbance.  Respiratory: Negative for cough,  chest tightness, shortness of breath and wheezing.   Cardiovascular: Negative for chest pain, palpitations and leg swelling.  Gastrointestinal: Negative for abdominal pain, constipation, diarrhea and vomiting.  Endocrine: Negative for polydipsia and polyuria.  Genitourinary: Negative for dysuria, frequency, genital sores, vaginal bleeding and vaginal discharge.  Musculoskeletal: Negative for arthralgias, gait problem and joint swelling.  Skin: Negative for color change and rash.  Neurological: Negative for dizziness, tremors, light-headedness and headaches.  Hematological: Negative for adenopathy. Does not bruise/bleed easily.  Psychiatric/Behavioral: Negative for dysphoric mood and sleep disturbance. The patient is not nervous/anxious.     Patient Active Problem List   Diagnosis Date Noted  . Plantar fasciitis, left 01/22/2017  . Female stress incontinence 01/21/2016  . Benign essential HTN 12/18/2014  . Mixed hyperlipidemia 10/24/2014  . Vasovagal syncope 10/24/2014  . Obesity (BMI 30.0-34.9) 10/24/2014  . FH: CAD (coronary artery disease) 10/24/2014  . FH: stroke 10/24/2014    Allergies  Allergen Reactions  . Penicillin V Potassium Rash    Past Surgical History:  Procedure Laterality Date  . BREAST BIOPSY Right    neg  . COLONOSCOPY  08/21/2008   normal    Social History   Tobacco Use  . Smoking status: Never Smoker  . Smokeless tobacco: Never Used  . Tobacco comment: smoking cessation materials not required  Substance Use Topics  . Alcohol use: Yes    Alcohol/week: 2.0 standard drinks    Types: 2 Glasses of wine per week    Comment: 1-2 times per week  . Drug use: No     Medication list has been  reviewed and updated.  Current Meds  Medication Sig  . atorvastatin (LIPITOR) 10 MG tablet Take 1 tablet (10 mg total) by mouth daily.  Marland Kitchen losartan (COZAAR) 50 MG tablet Take 1 tablet (50 mg total) by mouth daily.  . metoprolol succinate (TOPROL XL) 100 MG 24 hr  tablet Take 1 tablet (100 mg total) by mouth daily. Take with or immediately following a meal.  . Multiple Vitamin (MULTIVITAMIN) tablet Take 1 tablet by mouth daily.    PHQ 2/9 Scores 01/30/2019 01/25/2019 01/25/2018 01/22/2017  PHQ - 2 Score 0 0 0 0    BP Readings from Last 3 Encounters:  01/30/19 112/80  01/25/18 128/68  07/22/17 132/86    Physical Exam Vitals signs and nursing note reviewed.  Constitutional:      General: She is not in acute distress.    Appearance: She is well-developed.  HENT:     Head: Normocephalic and atraumatic.     Right Ear: Tympanic membrane and ear canal normal.     Left Ear: Tympanic membrane and ear canal normal.     Nose:     Right Sinus: No maxillary sinus tenderness.     Left Sinus: No maxillary sinus tenderness.  Eyes:     General: No scleral icterus.       Right eye: No discharge.        Left eye: No discharge.     Conjunctiva/sclera: Conjunctivae normal.  Neck:     Musculoskeletal: Normal range of motion. No erythema.     Thyroid: No thyromegaly.     Vascular: No carotid bruit.  Cardiovascular:     Rate and Rhythm: Normal rate and regular rhythm.     Pulses: Normal pulses.     Heart sounds: Normal heart sounds.  Pulmonary:     Effort: Pulmonary effort is normal. No respiratory distress.     Breath sounds: No wheezing.  Chest:     Breasts:        Right: No mass, nipple discharge, skin change or tenderness.        Left: No mass, nipple discharge, skin change or tenderness.  Abdominal:     General: Bowel sounds are normal.     Palpations: Abdomen is soft.     Tenderness: There is no abdominal tenderness.  Musculoskeletal: Normal range of motion.  Lymphadenopathy:     Cervical: No cervical adenopathy.  Skin:    General: Skin is warm and dry.     Findings: No rash.  Neurological:     Mental Status: She is alert and oriented to person, place, and time.     Cranial Nerves: No cranial nerve deficit.     Sensory: No sensory  deficit.     Motor: Motor function is intact.     Coordination: Coordination is intact.     Deep Tendon Reflexes: Reflexes are normal and symmetric.     Reflex Scores:      Bicep reflexes are 2+ on the right side and 2+ on the left side.      Patellar reflexes are 2+ on the right side and 2+ on the left side. Psychiatric:        Attention and Perception: Attention normal.        Mood and Affect: Mood normal.        Speech: Speech normal.        Behavior: Behavior normal.        Thought Content: Thought content normal.  Judgment: Judgment normal.     Wt Readings from Last 3 Encounters:  01/30/19 173 lb (78.5 kg)  01/25/18 178 lb (80.7 kg)  07/22/17 178 lb (80.7 kg)    BP 112/80   Pulse 70   Ht 5\' 5"  (1.651 m)   Wt 173 lb (78.5 kg)   SpO2 97%   BMI 28.79 kg/m   Assessment and Plan: 1. Annual physical exam Normal exam Continue healthy diet and exercise - POCT urinalysis dipstick  2. Encounter for screening mammogram for breast cancer To be [facility] at [facility]  3. Need for vaccination for pneumococcus - Pneumococcal conjugate vaccine 13-valent IM  4. Benign essential HTN Clinically stable exam with well controlled BP.   Tolerating medications, losartan 50 mg and metoprolol 100 mg per day, without side effects at this time. Pt to continue current regimen and low sodium diet; benefits of regular exercise as able discussed. - CBC with Differential/Platelet - Comprehensive metabolic panel - TSH  5. Mixed hyperlipidemia Tolerating statin medication without side effects at this time Continue same therapy without change at this time. - Lipid panel  6. Encounter for screening for osteoporosis To be scheduled with mammogram   Partially dictated using Dragon software. Any errors are unintentional.  Halina Maidens, MD [facility]  01/30/2019

## 2019-01-31 LAB — COMPREHENSIVE METABOLIC PANEL
ALT: 18 IU/L (ref 0–32)
AST: 23 IU/L (ref 0–40)
Albumin/Globulin Ratio: 2 (ref 1.2–2.2)
Albumin: 4.6 g/dL (ref 3.8–4.8)
Alkaline Phosphatase: 137 IU/L — ABNORMAL HIGH (ref 39–117)
BUN/Creatinine Ratio: 16 (ref 12–28)
BUN: 18 mg/dL (ref 8–27)
Bilirubin Total: 0.6 mg/dL (ref 0.0–1.2)
CO2: 20 mmol/L (ref 20–29)
Calcium: 9.7 mg/dL (ref 8.7–10.3)
Chloride: 103 mmol/L (ref 96–106)
Creatinine, Ser: 1.14 mg/dL — ABNORMAL HIGH (ref 0.57–1.00)
GFR calc Af Amer: 58 mL/min/{1.73_m2} — ABNORMAL LOW (ref >59)
GFR calc non Af Amer: 50 mL/min/{1.73_m2} — ABNORMAL LOW (ref >59)
Globulin, Total: 2.3 g/dL (ref 1.5–4.5)
Glucose: 99 mg/dL (ref 65–99)
Potassium: 4.7 mmol/L (ref 3.5–5.2)
Sodium: 140 mmol/L (ref 134–144)
Total Protein: 6.9 g/dL (ref 6.0–8.5)

## 2019-01-31 LAB — LIPID PANEL
Chol/HDL Ratio: 3.8 ratio (ref 0.0–4.4)
Cholesterol, Total: 204 mg/dL — ABNORMAL HIGH (ref 100–199)
HDL: 54 mg/dL (ref >39)
LDL Chol Calc (NIH): 121 mg/dL — ABNORMAL HIGH (ref 0–99)
Triglycerides: 166 mg/dL — ABNORMAL HIGH (ref 0–149)
VLDL Cholesterol Cal: 29 mg/dL (ref 5–40)

## 2019-01-31 LAB — CBC WITH DIFFERENTIAL/PLATELET
Basophils Absolute: 0.1 10*3/uL (ref 0.0–0.2)
Basos: 1 %
EOS (ABSOLUTE): 0.1 10*3/uL (ref 0.0–0.4)
Eos: 2 %
Hematocrit: 43.1 % (ref 34.0–46.6)
Hemoglobin: 14.6 g/dL (ref 11.1–15.9)
Immature Grans (Abs): 0 10*3/uL (ref 0.0–0.1)
Immature Granulocytes: 0 %
Lymphocytes Absolute: 1.8 10*3/uL (ref 0.7–3.1)
Lymphs: 28 %
MCH: 31.3 pg (ref 26.6–33.0)
MCHC: 33.9 g/dL (ref 31.5–35.7)
MCV: 93 fL (ref 79–97)
Monocytes Absolute: 0.4 10*3/uL (ref 0.1–0.9)
Monocytes: 7 %
Neutrophils Absolute: 4 10*3/uL (ref 1.4–7.0)
Neutrophils: 62 %
Platelets: 217 10*3/uL (ref 150–450)
RBC: 4.66 x10E6/uL (ref 3.77–5.28)
RDW: 12.7 % (ref 11.7–15.4)
WBC: 6.5 10*3/uL (ref 3.4–10.8)

## 2019-01-31 LAB — TSH: TSH: 0.802 u[IU]/mL (ref 0.450–4.500)

## 2019-03-06 ENCOUNTER — Ambulatory Visit
Admission: RE | Admit: 2019-03-06 | Discharge: 2019-03-06 | Disposition: A | Discharge status: Home / Self Care | Payer: Medicare HMO | Source: Ambulatory Visit | Attending: Internal Medicine | Admitting: Internal Medicine

## 2019-03-06 ENCOUNTER — Other Ambulatory Visit: Payer: Self-pay

## 2019-03-06 DIAGNOSIS — M85852 Other specified disorders of bone density and structure, left thigh: Secondary | ICD-10-CM | POA: Diagnosis not present

## 2019-03-06 DIAGNOSIS — Z1231 Encounter for screening mammogram for malignant neoplasm of breast: Secondary | ICD-10-CM | POA: Diagnosis not present

## 2019-03-06 DIAGNOSIS — Z780 Asymptomatic menopausal state: Secondary | ICD-10-CM | POA: Insufficient documentation

## 2019-03-27 DIAGNOSIS — R69 Illness, unspecified: Secondary | ICD-10-CM | POA: Diagnosis not present

## 2019-06-20 ENCOUNTER — Telehealth: Payer: Self-pay

## 2019-06-20 DIAGNOSIS — Z1211 Encounter for screening for malignant neoplasm of colon: Secondary | ICD-10-CM

## 2019-06-20 NOTE — Telephone Encounter (Signed)
Placed referral for GI and patient agreed.   CM

## 2019-06-20 NOTE — Telephone Encounter (Signed)
ERROR

## 2019-06-20 NOTE — Telephone Encounter (Signed)
-----   Message from Glean Hess, MD sent at 06/18/2019  3:00 PM EDT ----- Regarding: CRC screening Pt is due for Colonoscopy.  Can we refer her?  Or would she prefer to do Cologuard?

## 2019-08-09 ENCOUNTER — Ambulatory Visit: Payer: Self-pay | Admitting: *Deleted

## 2019-08-09 NOTE — Telephone Encounter (Signed)
Patient has experienced dizziness (woozy-headed) several times over the last 4 days. Occurs when she bends over and with turning in bed. Resolves quickly after being upright. Denies SOB/CP/HA/vision changes/weakness. No N/V and voiding without difficulty. Denies fever. Drinking 4-5 glasses water daily. No recent B/P check, takes b/p medications as prescribed. Reviewed Care Advice with the patient and scheduled appointment for Thursday morning with pcp.    Reason for Disposition  [1] MODERATE dizziness (e.g., interferes with normal activities) AND [2] has NOT been evaluated by physician for this  (Exception: dizziness caused by heat exposure, sudden standing, or poor fluid intake)  [1] MILD dizziness (e.g., walking normally) AND [2] has NOT been evaluated by physician for this  (Exception: dizziness caused by heat exposure, sudden standing, or poor fluid intake)  Answer Assessment - Initial Assessment Questions 1. DESCRIPTION: "Describe your dizziness."     Feels drunk 2. LIGHTHEADED: "Do you feel lightheaded?" (e.g., somewhat faint, woozy, weak upon standing)      3. VERTIGO: "Do you feel like either you or the room is spinning or tilting?" (i.e. vertigo)     No, just feels woozy 4. SEVERITY: "How bad is it?"  "Do you feel like you are going to faint?" "Can you stand and walk?"  No   - MILD - walking normally   - MODERATE - interferes with normal activities (e.g., work, school)    - SEVERE - unable to stand, requires support to walk, feels like passing out now.      mild 5. ONSET:  "When did the dizziness begin?"     Sunday. Today is day 4 6. AGGRAVATING FACTORS: "Does anything make it worse?" (e.g., standing, change in head position)     Bending over or turning in bed. 7. HEART RATE: "Can you tell me your heart rate?" "How many beats in 15 seconds?"  (Note: not all patients can do this)       no 8. CAUSE: "What do you think is causing the dizziness?"      9. RECURRENT SYMPTOM: "Have you  had dizziness before?" If so, ask: "When was the last time?" "What happened that time?"     no 10. OTHER SYMPTOMS: "Do you have any other symptoms?" (e.g., fever, chest pain, vomiting, diarrhea, bleeding)       none 11. PREGNANCY: "Is there any chance you are pregnant?" "When was your last menstrual period?"       no  Protocols used: DIZZINESS [facility]

## 2019-08-10 ENCOUNTER — Ambulatory Visit (INDEPENDENT_AMBULATORY_CARE_PROVIDER_SITE_OTHER): Payer: Medicare HMO | Admitting: Internal Medicine

## 2019-08-10 ENCOUNTER — Other Ambulatory Visit: Payer: Self-pay

## 2019-08-10 ENCOUNTER — Encounter: Payer: Self-pay | Admitting: Internal Medicine

## 2019-08-10 VITALS — BP 142/92 | HR 72 | Temp 98.3°F | Ht 65.0 in | Wt 174.0 lb

## 2019-08-10 DIAGNOSIS — I10 Essential (primary) hypertension: Secondary | ICD-10-CM | POA: Diagnosis not present

## 2019-08-10 DIAGNOSIS — R42 Dizziness and giddiness: Secondary | ICD-10-CM

## 2019-08-10 NOTE — Progress Notes (Signed)
Date:  08/10/2019   Name:  Heidi Powell   DOB:  [birth year]   MRN:  [id number]   Chief Complaint: Dizziness (X4-5 days only when bending over and when turning quickly in bed, no headache doesnt feel bad or sick, goes away after it happens)  Dizziness This is a new problem. The current episode started in the past 7 days. The problem occurs 2 to 4 times per day. The problem has been unchanged. Associated symptoms include vertigo. Pertinent negatives include no chest pain, chills, congestion, coughing, fatigue, fever, headaches, nausea, rash, vomiting or weakness. She has tried nothing for the symptoms.  Hypertension This is a chronic problem. Condition status: has not been checking BP at home. Pertinent negatives include no chest pain, headaches, palpitations or shortness of breath. Past treatments include angiotensin blockers and beta blockers. The current treatment provides significant improvement. There are no compliance problems.     Lab Results  Component Value Date   CREATININE 1.14 (H) 01/30/2019   BUN 18 01/30/2019   NA 140 01/30/2019   K 4.7 01/30/2019   CL 103 01/30/2019   CO2 20 01/30/2019   Lab Results  Component Value Date   CHOL 204 (H) 01/30/2019   HDL 54 01/30/2019   LDLCALC 121 (H) 01/30/2019   TRIG 166 (H) 01/30/2019   CHOLHDL 3.8 01/30/2019   Lab Results  Component Value Date   TSH 0.802 01/30/2019   Lab Results  Component Value Date   HGBA1C 5.3 01/25/2018   Lab Results  Component Value Date   WBC 6.5 01/30/2019   HGB 14.6 01/30/2019   HCT 43.1 01/30/2019   MCV 93 01/30/2019   PLT 217 01/30/2019   Lab Results  Component Value Date   ALT 18 01/30/2019   AST 23 01/30/2019   ALKPHOS 137 (H) 01/30/2019   BILITOT 0.6 01/30/2019     Review of Systems  Constitutional: Negative for chills, fatigue and fever.  HENT: Negative for congestion, hearing loss, rhinorrhea, sinus pressure and tinnitus.   Respiratory: Negative for cough and  shortness of breath.   Cardiovascular: Negative for chest pain, palpitations and leg swelling.  Gastrointestinal: Negative for nausea and vomiting.  Skin: Negative for rash.  Allergic/Immunologic: Negative for environmental allergies.  Neurological: Positive for dizziness and vertigo. Negative for syncope, weakness and headaches.  Psychiatric/Behavioral: Positive for sleep disturbance. Negative for dysphoric mood. The patient is not nervous/anxious.     Patient Active Problem List   Diagnosis Date Noted  . Plantar fasciitis, left 01/22/2017  . Female stress incontinence 01/21/2016  . Benign essential HTN 12/18/2014  . Mixed hyperlipidemia 10/24/2014  . FH: CAD (coronary artery disease) 10/24/2014  . FH: stroke 10/24/2014    Allergies  Allergen Reactions  . Penicillin V Potassium Rash    Past Surgical History:  Procedure Laterality Date  . BREAST BIOPSY Right    neg  . COLONOSCOPY  08/21/2008   normal    Social History   Tobacco Use  . Smoking status: Never Smoker  . Smokeless tobacco: Never Used  . Tobacco comment: smoking cessation materials not required  Substance Use Topics  . Alcohol use: Yes    Alcohol/week: 2.0 standard drinks    Types: 2 Glasses of wine per week    Comment: 1-2 times per week  . Drug use: No     Medication list has been reviewed and updated.  Current Meds  Medication Sig  . atorvastatin (LIPITOR) 10 MG tablet Take 1  tablet (10 mg total) by mouth daily.  Marland Kitchen losartan (COZAAR) 50 MG tablet Take 1 tablet (50 mg total) by mouth daily.  . metoprolol succinate (TOPROL XL) 100 MG 24 hr tablet Take 1 tablet (100 mg total) by mouth daily. Take with or immediately following a meal.  . Multiple Vitamin (MULTIVITAMIN) tablet Take 1 tablet by mouth daily.    PHQ 2/9 Scores 08/10/2019 01/30/2019 01/25/2019 01/25/2018  PHQ - 2 Score 0 0 0 0  PHQ- 9 Score 0 - - -    BP Readings from Last 3 Encounters:  08/10/19 (!) 142/92  01/30/19 112/80  01/25/18  128/68    Physical Exam Vitals and nursing note reviewed.  Constitutional:      General: She is not in acute distress.    Appearance: She is well-developed.  HENT:     Head: Normocephalic and atraumatic.     Right Ear: Tympanic membrane and ear canal normal.     Left Ear: Tympanic membrane and ear canal normal.     Nose: Nose normal.  Neck:     Vascular: No carotid bruit.  Cardiovascular:     Rate and Rhythm: Normal rate and regular rhythm.     Pulses: Normal pulses.     Heart sounds: No murmur.  Pulmonary:     Effort: Pulmonary effort is normal. No respiratory distress.  Musculoskeletal:     Cervical back: Normal range of motion.     Right lower leg: No edema.     Left lower leg: No edema.  Lymphadenopathy:     Cervical: No cervical adenopathy.  Skin:    General: Skin is warm and dry.     Capillary Refill: Capillary refill takes less than 2 seconds.     Findings: No rash.  Neurological:     General: No focal deficit present.     Mental Status: She is alert and oriented to person, place, and time.  Psychiatric:        Mood and Affect: Mood normal.        Behavior: Behavior normal.     Wt Readings from Last 3 Encounters:  08/10/19 174 lb (78.9 kg)  01/30/19 173 lb (78.5 kg)  01/25/18 178 lb (80.7 kg)    BP (!) 142/92   Pulse 72   Temp 98.3 F (36.8 C) (Oral)   Ht 5\' 5"  (1.651 m)   Wt 174 lb (78.9 kg)   SpO2 97%   BMI 28.96 kg/m   Assessment and Plan: 1. Vertigo Likely self limited episode Pt may use Dramamine if sx are severe and associated with N/V Expect resolution over the next week If persistent, will refer to ENT  2. Benign essential HTN BP not controlled today; suspect this is due to current issues with vertigo Pt will continue same regimen; monitor BP at home and return if persistently elevated for medication adjustment   Partially dictated using Dragon software. Any errors are unintentional.  Halina Maidens, MD [facility]  08/10/2019

## 2020-01-03 ENCOUNTER — Other Ambulatory Visit: Payer: Self-pay | Admitting: Internal Medicine

## 2020-01-03 DIAGNOSIS — E782 Mixed hyperlipidemia: Secondary | ICD-10-CM

## 2020-01-24 LAB — FECAL OCCULT BLOOD, IMMUNOCHEMICAL: IFOBT: NEGATIVE

## 2020-01-29 ENCOUNTER — Ambulatory Visit (INDEPENDENT_AMBULATORY_CARE_PROVIDER_SITE_OTHER): Payer: Medicare HMO

## 2020-01-29 DIAGNOSIS — Z1231 Encounter for screening mammogram for malignant neoplasm of breast: Secondary | ICD-10-CM

## 2020-01-29 DIAGNOSIS — Z0000 Encounter for general adult medical examination without abnormal findings: Secondary | ICD-10-CM | POA: Diagnosis not present

## 2020-01-29 NOTE — Patient Instructions (Signed)
Ms. Bradham , Thank you for taking time to come for your Medicare Wellness Visit. I appreciate your ongoing commitment to your health goals. Please review the following plan we discussed and let me know if I can assist you in the future.   Screening recommendations/referrals: Colonoscopy: done 2010. Due. Please bring report of FOBT from [facility] if available.  Mammogram: done 03/06/19. Please call [phone] to schedule your mammogram.  Bone Density: done 03/06/19 Recommended yearly ophthalmology/optometry visit for glaucoma screening and checkup Recommended yearly dental visit for hygiene and checkup  Vaccinations: Influenza vaccine: due Pneumococcal vaccine: Prevnar13 done 01/30/19. Due for Pneumovax23 Tdap vaccine: due Shingles vaccine: Shingrix discussed. Please contact your pharmacy for coverage information.  Covid-19: done 06/20/19 & 07/11/19  Advanced directives: Please bring a copy of your health care power of attorney and living will to the office at your convenience.  Conditions/risks identified: Keep up the great work!  Next appointment: Follow up in one year for your annual wellness visit    Preventive Care 67 Years and Older, Female Preventive care refers to lifestyle choices and visits with your health care provider that can promote health and wellness. What does preventive care include?  A yearly physical exam. This is also called an annual well check.  Dental exams once or twice a year.  Routine eye exams. Ask your health care provider how often you should have your eyes checked.  Personal lifestyle choices, including:  Daily care of your teeth and gums.  Regular physical activity.  Eating a healthy diet.  Avoiding tobacco and drug use.  Limiting alcohol use.  Practicing safe sex.  Taking low-dose aspirin every day.  Taking vitamin and mineral supplements as recommended by your health care provider. What happens during an annual well check? The services and  screenings done by your health care provider during your annual well check will depend on your age, overall health, lifestyle risk factors, and family history of disease. Counseling  Your health care provider may ask you questions about your:  Alcohol use.  Tobacco use.  Drug use.  Emotional well-being.  Home and relationship well-being.  Sexual activity.  Eating habits.  History of falls.  Memory and ability to understand (cognition).  Work and work Statistician.  Reproductive health. Screening  You may have the following tests or measurements:  Height, weight, and BMI.  Blood pressure.  Lipid and cholesterol levels. These may be checked every 5 years, or more frequently if you are over 67 years old.  Skin check.  Lung cancer screening. You may have this screening every year starting at age 67 if you have a 30-pack-year history of smoking and currently smoke or have quit within the past 15 years.  Fecal occult blood test (FOBT) of the stool. You may have this test every year starting at age 67.  Flexible sigmoidoscopy or colonoscopy. You may have a sigmoidoscopy every 5 years or a colonoscopy every 10 years starting at age 67.  Hepatitis C blood test.  Hepatitis B blood test.  Sexually transmitted disease (STD) testing.  Diabetes screening. This is done by checking your blood sugar (glucose) after you have not eaten for a while (fasting). You may have this done every 1-3 years.  Bone density scan. This is done to screen for osteoporosis. You may have this done starting at age 67.  Mammogram. This may be done every 1-2 years. Talk to your health care provider about how often you should have regular mammograms. Talk with your  health care provider about your test results, treatment options, and if necessary, the need for more tests. Vaccines  Your health care provider may recommend certain vaccines, such as:  Influenza vaccine. This is recommended every  year.  Tetanus, diphtheria, and acellular pertussis (Tdap, Td) vaccine. You may need a Td booster every 10 years.  Zoster vaccine. You may need this after age 9.  Pneumococcal 13-valent conjugate (PCV13) vaccine. One dose is recommended after age 55.  Pneumococcal polysaccharide (PPSV23) vaccine. One dose is recommended after age 18. Talk to your health care provider about which screenings and vaccines you need and how often you need them. This information is not intended to replace advice given to you by your health care provider. Make sure you discuss any questions you have with your health care provider. Document Released: 03/22/2015 Document Revised: 11/13/2015 Document Reviewed: 12/25/2014 Elsevier Interactive Patient Education  2017 [employer] Prevention in the Home Falls can cause injuries. They can happen to people of all ages. There are many things you can do to make your home safe and to help prevent falls. What can I do on the outside of my home?  Regularly fix the edges of walkways and driveways and fix any cracks.  Remove anything that might make you trip as you walk through a door, such as a raised step or threshold.  Trim any bushes or trees on the path to your home.  Use bright outdoor lighting.  Clear any walking paths of anything that might make someone trip, such as rocks or tools.  Regularly check to see if handrails are loose or broken. Make sure that both sides of any steps have handrails.  Any raised decks and porches should have guardrails on the edges.  Have any leaves, snow, or ice cleared regularly.  Use sand or salt on walking paths during winter.  Clean up any spills in your garage right away. This includes oil or grease spills. What can I do in the bathroom?  Use night lights.  Install grab bars by the toilet and in the tub and shower. Do not use towel bars as grab bars.  Use non-skid mats or decals in the tub or shower.  If you  need to sit down in the shower, use a plastic, non-slip stool.  Keep the floor dry. Clean up any water that spills on the floor as soon as it happens.  Remove soap buildup in the tub or shower regularly.  Attach bath mats securely with double-sided non-slip rug tape.  Do not have throw rugs and other things on the floor that can make you trip. What can I do in the bedroom?  Use night lights.  Make sure that you have a light by your bed that is easy to reach.  Do not use any sheets or blankets that are too big for your bed. They should not hang down onto the floor.  Have a firm chair that has side arms. You can use this for support while you get dressed.  Do not have throw rugs and other things on the floor that can make you trip. What can I do in the kitchen?  Clean up any spills right away.  Avoid walking on wet floors.  Keep items that you use a lot in easy-to-reach places.  If you need to reach something above you, use a strong step stool that has a grab bar.  Keep electrical cords out of the way.  Do not use  floor polish or wax that makes floors slippery. If you must use wax, use non-skid floor wax.  Do not have throw rugs and other things on the floor that can make you trip. What can I do with my stairs?  Do not leave any items on the stairs.  Make sure that there are handrails on both sides of the stairs and use them. Fix handrails that are broken or loose. Make sure that handrails are as long as the stairways.  Check any carpeting to make sure that it is firmly attached to the stairs. Fix any carpet that is loose or worn.  Avoid having throw rugs at the top or bottom of the stairs. If you do have throw rugs, attach them to the floor with carpet tape.  Make sure that you have a light switch at the top of the stairs and the bottom of the stairs. If you do not have them, ask someone to add them for you. What else can I do to help prevent falls?  Wear shoes  that:  Do not have high heels.  Have rubber bottoms.  Are comfortable and fit you well.  Are closed at the toe. Do not wear sandals.  If you use a stepladder:  Make sure that it is fully opened. Do not climb a closed stepladder.  Make sure that both sides of the stepladder are locked into place.  Ask someone to hold it for you, if possible.  Clearly mark and make sure that you can see:  Any grab bars or handrails.  First and last steps.  Where the edge of each step is.  Use tools that help you move around (mobility aids) if they are needed. These include:  Canes.  Walkers.  Scooters.  Crutches.  Turn on the lights when you go into a dark area. Replace any light bulbs as soon as they burn out.  Set up your furniture so you have a clear path. Avoid moving your furniture around.  If any of your floors are uneven, fix them.  If there are any pets around you, be aware of where they are.  Review your medicines with your doctor. Some medicines can make you feel dizzy. This can increase your chance of falling. Ask your doctor what other things that you can do to help prevent falls. This information is not intended to replace advice given to you by your health care provider. Make sure you discuss any questions you have with your health care provider. Document Released: 12/20/2008 Document Revised: 08/01/2015 Document Reviewed: 03/30/2014 Elsevier Interactive Patient Education  2017 [employer].

## 2020-01-29 NOTE — Progress Notes (Signed)
Subjective:   Heidi Powell is a 69 y.o. female who presents for Medicare Annual (Subsequent) preventive examination.  Virtual Visit via Telephone Note  I connected with  Heidi Powell on 01/29/20 at  9:20 AM EST by telephone and verified that I am speaking with the correct person using two identifiers.  Location: Patient: home Provider: [facility] Persons participating in the virtual visit: [place]   I discussed the limitations, risks, security and privacy concerns of performing an evaluation and management service by telephone and the availability of in person appointments. The patient expressed understanding and agreed to proceed.  Interactive audio and video telecommunications were attempted between this nurse and patient, however failed, due to patient having technical difficulties OR patient did not have access to video capability.  We continued and completed visit with audio only.  Some vital signs may be absent or patient reported.   Heidi Marker, LPN    Review of Systems     Cardiac Risk Factors include: advanced age (>66men, >83 women);hypertension;dyslipidemia     Objective:    There were no vitals filed for this visit. There is no height or weight on file to calculate BMI.  Advanced Directives 01/29/2020 01/25/2019 04/11/2015  Does Patient Have a Medical Advance Directive? Yes Yes Yes  Type of Paramedic of [place];Living will [place];Living will [place];Living will  Copy of [place] in Chart? No - copy requested No - copy requested -    Current Medications (verified) Outpatient Encounter Medications as of 01/29/2020  Medication Sig  . atorvastatin (LIPITOR) 10 MG tablet TAKE 1 TABLET BY MOUTH EVERY DAY  . losartan (COZAAR) 50 MG tablet Take 1 tablet (50 mg total) by mouth daily.  . metoprolol succinate (TOPROL XL) 100 MG 24 hr tablet Take 1  tablet (100 mg total) by mouth daily. Take with or immediately following a meal.  . Multiple Vitamin (MULTIVITAMIN) tablet Take 1 tablet by mouth daily.   No facility-administered encounter medications on file as of 01/29/2020.    Allergies (verified) Penicillin v potassium   History: Past Medical History:  Diagnosis Date  . Hyperlipidemia   . Hypertension   . Vasovagal syncope 10/24/2014   Past Surgical History:  Procedure Laterality Date  . BREAST BIOPSY Right    neg  . COLONOSCOPY  08/21/2008   normal   Family History  Problem Relation Age of Onset  . Hypertension Mother   . Renal cancer Mother   . Heart disease Father   . Hypertension Father   . Stroke Brother   . Breast cancer Neg Hx    Social History   Socioeconomic History  . Marital status: Divorced    Spouse name: Not on file  . Number of children: 1  . Years of education: Not on file  . Highest education level: Not on file  Occupational History  . Not on file  Tobacco Use  . Smoking status: Never Smoker  . Smokeless tobacco: Never Used  . Tobacco comment: smoking cessation materials not required  Vaping Use  . Vaping Use: Never used  Substance and Sexual Activity  . Alcohol use: Yes    Alcohol/week: 2.0 standard drinks    Types: 2 Glasses of wine per week    Comment: 1-2 times per week  . Drug use: No  . Sexual activity: Not on file  Other Topics Concern  . Not on file  Social History  Narrative   Pt lives alone   Social Determinants of Health   Financial Resource Strain: Low Risk   . Difficulty of Paying Living Expenses: Not hard at all  Food Insecurity: No Food Insecurity  . Worried About Charity fundraiser in the Last Year: Never true  . Ran Out of Food in the Last Year: Never true  Transportation Needs: No Transportation Needs  . Lack of Transportation (Medical): No  . Lack of Transportation (Non-Medical): No  Physical Activity: Sufficiently Active  . Days of Exercise per Week: 7  days  . Minutes of Exercise per Session: 30 min  Stress: No Stress Concern Present  . Feeling of Stress : Not at all  Social Connections: Socially Isolated  . Frequency of Communication with Friends and Family: More than three times a week  . Frequency of Social Gatherings with Friends and Family: Three times a week  . Attends Religious Services: Never  . Active Member of Clubs or Organizations: No  . Attends Archivist Meetings: Never  . Marital Status: Divorced    Tobacco Counseling Counseling given: Not Answered Comment: smoking cessation materials not required   Clinical Intake:  Pre-visit preparation completed: Yes  Pain : No/denies pain     Nutritional Risks: None Diabetes: No  How often do you need to have someone help you when you read instructions, pamphlets, or other written materials from your doctor or pharmacy?: 1 - Never    Interpreter Needed?: No  Information entered by :: Heidi Marker LPN   Activities of Daily Living In your present state of health, do you have any difficulty performing the following activities: 01/29/2020  Hearing? N  Comment declines hearing aids  Vision? N  Difficulty concentrating or making decisions? N  Walking or climbing stairs? N  Dressing or bathing? N  Doing errands, shopping? N  Preparing Food and eating ? N  Using the Toilet? N  In the past six months, have you accidently leaked urine? N  Do you have problems with loss of bowel control? N  Managing your Medications? N  Managing your Finances? N  Housekeeping or managing your Housekeeping? N  Some recent data might be hidden    Patient Care Team: Glean Hess, MD as PCP - General (Internal Medicine) Corey Skains, MD as Consulting Physician (Cardiology) Jannet Mantis, MD (Dermatology)  Indicate any recent Medical Services you may have received from other than [facility] providers in the past year (date may be approximate).     Assessment:     This is a routine wellness examination for [place].  Hearing/Vision screen  Hearing Screening   125Hz  250Hz  500Hz  1000Hz  2000Hz  3000Hz  4000Hz  6000Hz  8000Hz   Right ear:           Left ear:           Comments: Pt denies hearing difficulty  Vision Screening Comments: [facility] Dr. Mallie Mussel  Dietary issues and exercise activities discussed: Current Exercise Habits: Home exercise routine, Type of exercise: walking, Time (Minutes): 30, Frequency (Times/Week): 7, Weekly Exercise (Minutes/Week): 210, Intensity: Mild, Exercise limited by: None identified  Goals    . Patient Stated     Pt states she would like to overall stay healthy and active.       Depression Screen PHQ 2/9 Scores 01/29/2020 08/10/2019 01/30/2019 01/25/2019 01/25/2018 01/22/2017  PHQ - 2 Score 0 0 0 0 0 0  PHQ- 9 Score - 0 - - - -  Fall Risk Fall Risk  01/29/2020 08/10/2019 01/25/2019 05/28/2017  Falls in the past year? 0 0 0 No  Number falls in past yr: 0 0 0 -  Injury with Fall? 0 0 0 -  Risk for fall due to : No Fall Risks No Fall Risks - -  Follow up Falls prevention discussed Falls evaluation completed Falls prevention discussed -    Any stairs in or around the home? Yes  If so, are there any without handrails? No  Home free of loose throw rugs in walkways, pet beds, electrical cords, etc? Yes  Adequate lighting in your home to reduce risk of falls? Yes   ASSISTIVE DEVICES UTILIZED TO PREVENT FALLS:  Life alert? No  Use of a cane, walker or w/c? No  Grab bars in the bathroom? No  Shower chair or bench in shower? Yes  Elevated toilet seat or a handicapped toilet? No   TIMED UP AND GO:  Was the test performed? No . Telephonic visit  Cognitive Function: Normal cognitive status assessed by direct observation by this Nurse Health Advisor.          Immunizations Immunization History  Administered Date(s) Administered  . Fluad Quad(high Dose 65+) 01/10/2019  .  Influenza,inj,Quad PF,6+ Mos 01/21/2016, 01/22/2017, 01/25/2018  . Influenza-Unspecified 01/21/2016  . PFIZER SARS-COV-2 Vaccination 06/20/2019, 07/11/2019  . Pneumococcal Conjugate-13 01/30/2019    TDAP status: Due, Education has been provided regarding the importance of this vaccine. Advised may receive this vaccine at local pharmacy or Health Dept. Aware to provide a copy of the vaccination record if obtained from local pharmacy or Health Dept. Verbalized acceptance and understanding.   Flu vaccine status: pt plans to receive in office next week  Pneumococcal vaccine status: PCV13 up to date; pt plans to receive PPSV23 in office next week  Covid-19 vaccine status: Completed vaccines  Qualifies for Shingles Vaccine? Yes   Zostavax completed No   Shingrix Completed?: No.    Education has been provided regarding the importance of this vaccine. Patient has been advised to call insurance company to determine out of pocket expense if they have not yet received this vaccine. Advised may also receive vaccine at local pharmacy or Health Dept. Verbalized acceptance and understanding.  Screening Tests Health Maintenance  Topic Date Due  . COLONOSCOPY  08/22/2018  . INFLUENZA VACCINE  10/08/2019  . PNA vac Low Risk Adult (2 of 2 - PPSV23) 01/30/2020  . MAMMOGRAM  03/05/2020  . TETANUS/TDAP  03/23/2024  . DEXA SCAN  Completed  . COVID-19 Vaccine  Completed  . Hepatitis C Screening  Completed    Health Maintenance  Health Maintenance Due  Topic Date Due  . COLONOSCOPY  08/22/2018  . INFLUENZA VACCINE  10/08/2019    Colorectal cancer screening: Completed 2010. Repeat every 10 years. Referred on 06/20/19. Pt completed FOBT through [employer] earlier this month, need results.   Mammogram status: Completed 03/06/19. Repeat every year   Bone Density status: Completed 03/06/19. Results reflect: Bone density results: OSTEOPENIA. Repeat every 2 years.  Lung Cancer Screening: (Low Dose CT Chest  recommended if Age 70-80 years, 30 pack-year currently smoking OR have quit w/in 15years.) does not qualify.   Additional Screening:  Hepatitis C Screening: does qualify; Completed 01/21/16  Vision Screening: Recommended annual ophthalmology exams for early detection of glaucoma and other disorders of the eye. Is the patient up to date with their annual eye exam?  Yes  Who is the provider or what is the  name of the office in which the patient attends annual eye exams? Dr. Mallie Mussel  Dental Screening: Recommended annual dental exams for proper oral hygiene  Community Resource Referral / Chronic Care Management: CRR required this visit?  No   CCM required this visit?  No      Plan:     I have personally reviewed and noted the following in the patient's chart:   . Medical and social history . Use of alcohol, tobacco or illicit drugs  . Current medications and supplements . Functional ability and status . Nutritional status . Physical activity . Advanced directives . List of other physicians . Hospitalizations, surgeries, and ER visits in previous 12 months . Vitals . Screenings to include cognitive, depression, and falls . Referrals and appointments  In addition, I have reviewed and discussed with patient certain preventive protocols, quality metrics, and best practice recommendations. A written personalized care plan for preventive services as well as general preventive health recommendations were provided to patient.     Heidi Marker, LPN   [id number]   Nurse Notes: pt doing well and appreciative of visit today

## 2020-02-05 ENCOUNTER — Other Ambulatory Visit: Payer: Self-pay

## 2020-02-05 ENCOUNTER — Ambulatory Visit (INDEPENDENT_AMBULATORY_CARE_PROVIDER_SITE_OTHER): Payer: Medicare HMO | Admitting: Internal Medicine

## 2020-02-05 ENCOUNTER — Encounter: Payer: Self-pay | Admitting: Internal Medicine

## 2020-02-05 VITALS — BP 139/82 | HR 71 | Temp 98.0°F | Ht 65.0 in | Wt 172.0 lb

## 2020-02-05 DIAGNOSIS — N289 Disorder of kidney and ureter, unspecified: Secondary | ICD-10-CM

## 2020-02-05 DIAGNOSIS — Z0000 Encounter for general adult medical examination without abnormal findings: Secondary | ICD-10-CM | POA: Diagnosis not present

## 2020-02-05 DIAGNOSIS — Z1211 Encounter for screening for malignant neoplasm of colon: Secondary | ICD-10-CM | POA: Diagnosis not present

## 2020-02-05 DIAGNOSIS — Z1231 Encounter for screening mammogram for malignant neoplasm of breast: Secondary | ICD-10-CM | POA: Diagnosis not present

## 2020-02-05 DIAGNOSIS — I10 Essential (primary) hypertension: Secondary | ICD-10-CM

## 2020-02-05 DIAGNOSIS — E782 Mixed hyperlipidemia: Secondary | ICD-10-CM

## 2020-02-05 DIAGNOSIS — Z23 Encounter for immunization: Secondary | ICD-10-CM

## 2020-02-05 LAB — POCT URINALYSIS DIPSTICK
Bilirubin, UA: NEGATIVE
Glucose, UA: NEGATIVE
Ketones, UA: NEGATIVE
Leukocytes, UA: NEGATIVE
Nitrite, UA: NEGATIVE
Protein, UA: POSITIVE — AB
Spec Grav, UA: >=1.03 — AB (ref 1.010–1.025)
Urobilinogen, UA: 0.2 E.U./dL
pH, UA: 6 (ref 5.0–8.0)

## 2020-02-05 MED ORDER — LOSARTAN POTASSIUM 50 MG PO TABS: 50.0000 mg | ORAL_TABLET | Freq: Every day | ORAL | 3 refills | Status: DC

## 2020-02-05 MED ORDER — ATORVASTATIN CALCIUM 10 MG PO TABS: 10.0000 mg | ORAL_TABLET | Freq: Every day | ORAL | 3 refills | Status: DC

## 2020-02-05 MED ORDER — METOPROLOL SUCCINATE ER 100 MG PO TB24: 100.0000 mg | ORAL_TABLET | Freq: Every day | ORAL | 3 refills | Status: DC

## 2020-02-05 NOTE — Progress Notes (Signed)
Date:  02/05/2020   Name:  Heidi Powell   DOB:  Jul 18, 1950   MRN:  [id number]   Chief Complaint: Annual Exam (breast exam no pap)  Heidi Powell is a 69 y.o. female who presents today for her Complete Annual Exam. She feels well. She reports exercising walking x7 days a week. She reports she is sleeping poorly. Breast complaints none.  Mammogram: 02/2019 [facility] - ordered at [place]: 02/2019 osteopenia Pap smear: discontinued Colonoscopy: FOBT 01/24/20 negative  Immunization History  Administered Date(s) Administered  . Fluad Quad(high Dose 65+) 01/10/2019  . Influenza,inj,Quad PF,6+ Mos 01/21/2016, 01/22/2017, 01/25/2018  . Influenza-Unspecified 01/21/2016  . PFIZER SARS-COV-2 Vaccination 06/20/2019, 07/11/2019  . Pneumococcal Conjugate-13 01/30/2019  PPV-23 due  Hypertension This is a chronic problem. The problem is controlled. Pertinent negatives include no chest pain, headaches, palpitations or shortness of breath. Past treatments include beta blockers and angiotensin blockers. The current treatment provides significant improvement. There are no compliance problems.  There is no history of kidney disease, CAD/MI or CVA.  Hyperlipidemia The problem is controlled. Pertinent negatives include no chest pain or shortness of breath. Current antihyperlipidemic treatment includes statins. The current treatment provides significant improvement of lipids. There are no compliance problems.     Lab Results  Component Value Date   CREATININE 1.14 (H) 01/30/2019   BUN 18 01/30/2019   NA 140 01/30/2019   K 4.7 01/30/2019   CL 103 01/30/2019   CO2 20 01/30/2019   Lab Results  Component Value Date   CHOL 204 (H) 01/30/2019   HDL 54 01/30/2019   LDLCALC 121 (H) 01/30/2019   TRIG 166 (H) 01/30/2019   CHOLHDL 3.8 01/30/2019   Lab Results  Component Value Date   TSH 0.802 01/30/2019   Lab Results  Component Value Date   HGBA1C 5.3 01/25/2018   Lab Results    Component Value Date   WBC 6.5 01/30/2019   HGB 14.6 01/30/2019   HCT 43.1 01/30/2019   MCV 93 01/30/2019   PLT 217 01/30/2019   Lab Results  Component Value Date   ALT 18 01/30/2019   AST 23 01/30/2019   ALKPHOS 137 (H) 01/30/2019   BILITOT 0.6 01/30/2019     Review of Systems  Constitutional: Negative for chills, fatigue and fever.  HENT: Negative for congestion, hearing loss, tinnitus, trouble swallowing and voice change.   Eyes: Negative for visual disturbance.  Respiratory: Negative for cough, chest tightness, shortness of breath and wheezing.   Cardiovascular: Negative for chest pain, palpitations and leg swelling.  Gastrointestinal: Negative for abdominal pain, constipation, diarrhea and vomiting.  Endocrine: Negative for polydipsia and polyuria.  Genitourinary: Negative for dysuria, frequency, genital sores, vaginal bleeding and vaginal discharge.  Musculoskeletal: Negative for arthralgias, gait problem and joint swelling.  Skin: Negative for color change and rash.  Neurological: Negative for dizziness, tremors, light-headedness and headaches.  Hematological: Negative for adenopathy. Does not bruise/bleed easily.  Psychiatric/Behavioral: Positive for sleep disturbance (wakes frequently). Negative for dysphoric mood. The patient is not nervous/anxious.     Patient Active Problem List   Diagnosis Date Noted  . Plantar fasciitis, left 01/22/2017  . Female stress incontinence 01/21/2016  . Benign essential HTN 12/18/2014  . Mixed hyperlipidemia 10/24/2014  . FH: CAD (coronary artery disease) 10/24/2014  . FH: stroke 10/24/2014    Allergies  Allergen Reactions  . Penicillin V Potassium Rash    Past Surgical History:  Procedure Laterality Date  . BREAST BIOPSY Right  neg  . COLONOSCOPY  08/21/2008   normal    Social History   Tobacco Use  . Smoking status: Never Smoker  . Smokeless tobacco: Never Used  . Tobacco comment: smoking cessation materials  not required  Vaping Use  . Vaping Use: Never used  Substance Use Topics  . Alcohol use: Yes    Alcohol/week: 2.0 standard drinks    Types: 2 Glasses of wine per week    Comment: 1-2 times per week  . Drug use: No     Medication list has been reviewed and updated.  Current Meds  Medication Sig  . atorvastatin (LIPITOR) 10 MG tablet Take 1 tablet (10 mg total) by mouth daily.  Marland Kitchen losartan (COZAAR) 50 MG tablet Take 1 tablet (50 mg total) by mouth daily.  . metoprolol succinate (TOPROL XL) 100 MG 24 hr tablet Take 1 tablet (100 mg total) by mouth daily. Take with or immediately following a meal.  . Multiple Vitamin (MULTIVITAMIN) tablet Take 1 tablet by mouth daily.  . [DISCONTINUED] atorvastatin (LIPITOR) 10 MG tablet TAKE 1 TABLET BY MOUTH EVERY DAY  . [DISCONTINUED] losartan (COZAAR) 50 MG tablet Take 1 tablet (50 mg total) by mouth daily.  . [DISCONTINUED] metoprolol succinate (TOPROL XL) 100 MG 24 hr tablet Take 1 tablet (100 mg total) by mouth daily. Take with or immediately following a meal.    PHQ 2/9 Scores 02/05/2020 01/29/2020 08/10/2019 01/30/2019  PHQ - 2 Score 0 0 0 0  PHQ- 9 Score 3 - 0 -    GAD 7 : Generalized Anxiety Score 02/05/2020 08/10/2019  Nervous, Anxious, on Edge 0 0  Control/stop worrying 0 0  Worry too much - different things 0 0  Trouble relaxing 0 0  Restless 0 0  Easily annoyed or irritable 0 0  Afraid - awful might happen 0 0  Total GAD 7 Score 0 0  Anxiety Difficulty - Not difficult at all    BP Readings from Last 3 Encounters:  02/05/20 139/82  08/10/19 (!) 142/92  01/30/19 112/80    Physical Exam Vitals and nursing note reviewed.  Constitutional:      General: She is not in acute distress.    Appearance: She is well-developed.  HENT:     Head: Normocephalic and atraumatic.     Right Ear: Tympanic membrane and ear canal normal.     Left Ear: Tympanic membrane and ear canal normal.     Nose:     Right Sinus: No maxillary sinus  tenderness.     Left Sinus: No maxillary sinus tenderness.  Eyes:     General: No scleral icterus.       Right eye: No discharge.        Left eye: No discharge.     Conjunctiva/sclera: Conjunctivae normal.  Neck:     Thyroid: No thyromegaly.     Vascular: No carotid bruit.  Cardiovascular:     Rate and Rhythm: Normal rate and regular rhythm.     Pulses: Normal pulses.     Heart sounds: Normal heart sounds.  Pulmonary:     Effort: Pulmonary effort is normal. No respiratory distress.     Breath sounds: No wheezing.  Chest:     Breasts:        Right: No mass, nipple discharge, skin change or tenderness.        Left: No mass, nipple discharge, skin change or tenderness.  Abdominal:     General: Bowel sounds are  normal.     Palpations: Abdomen is soft.     Tenderness: There is no abdominal tenderness.  Musculoskeletal:        General: Normal range of motion.     Cervical back: Normal range of motion. No erythema.     Right lower leg: No edema.     Left lower leg: No edema.  Lymphadenopathy:     Cervical: No cervical adenopathy.  Skin:    General: Skin is warm and dry.     Capillary Refill: Capillary refill takes less than 2 seconds.     Findings: No rash.  Neurological:     General: No focal deficit present.     Mental Status: She is alert and oriented to person, place, and time.     Cranial Nerves: No cranial nerve deficit.     Sensory: No sensory deficit.     Deep Tendon Reflexes: Reflexes are normal and symmetric.  Psychiatric:        Attention and Perception: Attention normal.        Mood and Affect: Mood normal.     Wt Readings from Last 3 Encounters:  02/05/20 172 lb (78 kg)  08/10/19 174 lb (78.9 kg)  01/30/19 173 lb (78.5 kg)    BP 139/82   Pulse 71   Temp 98 F (36.7 C) (Oral)   Ht 5\' 5"  (1.651 m)   Wt 172 lb (78 kg)   SpO2 98%   BMI 28.62 kg/m   Assessment and Plan: 1. Annual physical exam Normal exam Continue exercise, healthy diet  2.  Encounter for screening mammogram for breast cancer To be scheduled at [facility]  3. Benign essential HTN Clinically stable exam with well controlled BP on metoprolol and losartan. Tolerating medications without side effects at this time. Pt to continue current regimen and low sodium diet; benefits of regular exercise as able discussed. - CBC with Differential/Platelet - Comprehensive metabolic panel - POCT urinalysis dipstick - micro showed insignificant RBC - TSH - losartan (COZAAR) 50 MG tablet; Take 1 tablet (50 mg total) by mouth daily.  Dispense: 90 tablet; Refill: 3 - metoprolol succinate (TOPROL XL) 100 MG 24 hr tablet; Take 1 tablet (100 mg total) by mouth daily. Take with or immediately following a meal.  Dispense: 90 tablet; Refill: 3  4. Mixed hyperlipidemia Continue statin therapy - Lipid panel - atorvastatin (LIPITOR) 10 MG tablet; Take 1 tablet (10 mg total) by mouth daily.  Dispense: 90 tablet; Refill: 3  5. Colon cancer screening FOBT completed - will continue yearly in lieu of colonoscopy  6. Renal insufficiency - Comprehensive metabolic panel  7. Need for vaccination for pneumococcus - Pneumococcal polysaccharide vaccine 23-valent greater than or equal to 2yo subcutaneous/IM   Partially dictated using Editor, commissioning. Any errors are unintentional.  Halina Maidens, MD [facility]  02/05/2020

## 2020-02-06 LAB — LIPID PANEL
Chol/HDL Ratio: 3.5 ratio (ref 0.0–4.4)
Cholesterol, Total: 213 mg/dL — ABNORMAL HIGH (ref 100–199)
HDL: 61 mg/dL (ref >39)
LDL Chol Calc (NIH): 126 mg/dL — ABNORMAL HIGH (ref 0–99)
Triglycerides: 145 mg/dL (ref 0–149)
VLDL Cholesterol Cal: 26 mg/dL (ref 5–40)

## 2020-02-06 LAB — CBC WITH DIFFERENTIAL/PLATELET
Basophils Absolute: 0 10*3/uL (ref 0.0–0.2)
Basos: 1 %
EOS (ABSOLUTE): 0.1 10*3/uL (ref 0.0–0.4)
Eos: 2 %
Hematocrit: 43.1 % (ref 34.0–46.6)
Hemoglobin: 14.7 g/dL (ref 11.1–15.9)
Immature Grans (Abs): 0 10*3/uL (ref 0.0–0.1)
Immature Granulocytes: 0 %
Lymphocytes Absolute: 1.4 10*3/uL (ref 0.7–3.1)
Lymphs: 24 %
MCH: 31.2 pg (ref 26.6–33.0)
MCHC: 34.1 g/dL (ref 31.5–35.7)
MCV: 92 fL (ref 79–97)
Monocytes Absolute: 0.4 10*3/uL (ref 0.1–0.9)
Monocytes: 6 %
Neutrophils Absolute: 4 10*3/uL (ref 1.4–7.0)
Neutrophils: 67 %
Platelets: 229 10*3/uL (ref 150–450)
RBC: 4.71 x10E6/uL (ref 3.77–5.28)
RDW: 12 % (ref 11.7–15.4)
WBC: 6 10*3/uL (ref 3.4–10.8)

## 2020-02-06 LAB — COMPREHENSIVE METABOLIC PANEL
ALT: 19 IU/L (ref 0–32)
AST: 24 IU/L (ref 0–40)
Albumin/Globulin Ratio: 1.9 (ref 1.2–2.2)
Albumin: 4.7 g/dL (ref 3.8–4.8)
Alkaline Phosphatase: 147 IU/L — ABNORMAL HIGH (ref 44–121)
BUN/Creatinine Ratio: 16 (ref 12–28)
BUN: 16 mg/dL (ref 8–27)
Bilirubin Total: 0.6 mg/dL (ref 0.0–1.2)
CO2: 20 mmol/L (ref 20–29)
Calcium: 9.9 mg/dL (ref 8.7–10.3)
Chloride: 103 mmol/L (ref 96–106)
Creatinine, Ser: 0.98 mg/dL (ref 0.57–1.00)
GFR calc Af Amer: 69 mL/min/{1.73_m2} (ref >59)
GFR calc non Af Amer: 60 mL/min/{1.73_m2} (ref >59)
Globulin, Total: 2.5 g/dL (ref 1.5–4.5)
Glucose: 98 mg/dL (ref 65–99)
Potassium: 4.5 mmol/L (ref 3.5–5.2)
Sodium: 140 mmol/L (ref 134–144)
Total Protein: 7.2 g/dL (ref 6.0–8.5)

## 2020-02-06 LAB — TSH: TSH: 0.834 u[IU]/mL (ref 0.450–4.500)

## 2020-03-06 ENCOUNTER — Ambulatory Visit
Admission: RE | Admit: 2020-03-06 | Discharge: 2020-03-06 | Disposition: A | Discharge status: Home / Self Care | Payer: Medicare HMO | Source: Ambulatory Visit | Attending: Internal Medicine | Admitting: Internal Medicine

## 2020-03-06 ENCOUNTER — Other Ambulatory Visit: Payer: Self-pay

## 2020-03-06 DIAGNOSIS — Z1231 Encounter for screening mammogram for malignant neoplasm of breast: Secondary | ICD-10-CM | POA: Insufficient documentation

## 2020-03-07 ENCOUNTER — Other Ambulatory Visit: Payer: Self-pay | Admitting: Internal Medicine

## 2020-03-07 DIAGNOSIS — R928 Other abnormal and inconclusive findings on diagnostic imaging of breast: Secondary | ICD-10-CM

## 2020-03-20 ENCOUNTER — Ambulatory Visit
Admission: RE | Admit: 2020-03-20 | Discharge: 2020-03-20 | Disposition: A | Discharge status: Home / Self Care | Payer: Medicare HMO | Source: Ambulatory Visit | Attending: Internal Medicine | Admitting: Internal Medicine

## 2020-03-20 ENCOUNTER — Other Ambulatory Visit: Payer: Self-pay

## 2020-03-20 DIAGNOSIS — R928 Other abnormal and inconclusive findings on diagnostic imaging of breast: Secondary | ICD-10-CM | POA: Insufficient documentation

## 2020-03-20 DIAGNOSIS — R922 Inconclusive mammogram: Secondary | ICD-10-CM | POA: Diagnosis not present

## 2020-03-21 ENCOUNTER — Encounter: Payer: Self-pay | Admitting: Internal Medicine

## 2020-03-21 ENCOUNTER — Other Ambulatory Visit: Payer: Self-pay | Admitting: Internal Medicine

## 2020-03-21 DIAGNOSIS — R928 Other abnormal and inconclusive findings on diagnostic imaging of breast: Secondary | ICD-10-CM

## 2020-04-01 ENCOUNTER — Ambulatory Visit
Admission: RE | Admit: 2020-04-01 | Discharge: 2020-04-01 | Disposition: A | Discharge status: Home / Self Care | Payer: Medicare HMO | Source: Ambulatory Visit | Attending: Internal Medicine | Admitting: Internal Medicine

## 2020-04-01 ENCOUNTER — Other Ambulatory Visit: Payer: Self-pay

## 2020-04-01 DIAGNOSIS — R928 Other abnormal and inconclusive findings on diagnostic imaging of breast: Secondary | ICD-10-CM | POA: Insufficient documentation

## 2020-04-01 DIAGNOSIS — Z7689 Persons encountering health services in other specified circumstances: Secondary | ICD-10-CM | POA: Diagnosis not present

## 2020-04-01 HISTORY — PX: BREAST BIOPSY: SHX20

## 2020-04-02 ENCOUNTER — Encounter: Payer: Self-pay | Admitting: *Deleted

## 2020-04-02 LAB — SURGICAL PATHOLOGY

## 2020-04-02 NOTE — Progress Notes (Signed)
Notified by Electa Sniff, RN that patient needs surgical consult for Complex Sclerosing Lesion of the left breast.  I have talked to the patient and she is scheduled to see Dr. Bary Castilla on 04/09/20 @ 9:15 for a surgical consult.

## 2020-04-09 DIAGNOSIS — N6489 Other specified disorders of breast: Secondary | ICD-10-CM | POA: Diagnosis not present

## 2020-08-07 ENCOUNTER — Ambulatory Visit (INDEPENDENT_AMBULATORY_CARE_PROVIDER_SITE_OTHER): Payer: Medicare HMO | Admitting: Internal Medicine

## 2020-08-07 ENCOUNTER — Other Ambulatory Visit: Payer: Self-pay

## 2020-08-07 ENCOUNTER — Encounter: Payer: Self-pay | Admitting: Internal Medicine

## 2020-08-07 VITALS — BP 134/76 | HR 64 | Temp 98.1°F | Ht 65.0 in | Wt 169.0 lb

## 2020-08-07 DIAGNOSIS — R928 Other abnormal and inconclusive findings on diagnostic imaging of breast: Secondary | ICD-10-CM

## 2020-08-07 DIAGNOSIS — Z23 Encounter for immunization: Secondary | ICD-10-CM | POA: Diagnosis not present

## 2020-08-07 DIAGNOSIS — I10 Essential (primary) hypertension: Secondary | ICD-10-CM | POA: Diagnosis not present

## 2020-08-07 MED ORDER — SHINGRIX 50 MCG/0.5ML IM SUSR: 0.5000 mL | Freq: Once | INTRAMUSCULAR | 1 refills | Status: AC

## 2020-08-07 NOTE — Progress Notes (Signed)
Date:  08/07/2020   Name:  Heidi Powell   DOB:  [birth year]   MRN:  [id number]   Chief Complaint: Hypertension and Immunizations (Shingles/)  Hypertension This is a chronic problem. The problem is controlled. Pertinent negatives include no chest pain, headaches, palpitations or shortness of breath. Past treatments include beta blockers and angiotensin blockers. The current treatment provides significant improvement.   Abnormal mammogram - on the left; had biopsy showing sclerosing lesion without atypia.  She decided on a 6 mo follow up rather than wider excision after consultation with Dr. Bary Castilla. Currently denies any breast tenderness or mass.  Lab Results  Component Value Date   CREATININE 0.98 02/05/2020   BUN 16 02/05/2020   NA 140 02/05/2020   K 4.5 02/05/2020   CL 103 02/05/2020   CO2 20 02/05/2020   Lab Results  Component Value Date   CHOL 213 (H) 02/05/2020   HDL 61 02/05/2020   LDLCALC 126 (H) 02/05/2020   TRIG 145 02/05/2020   CHOLHDL 3.5 02/05/2020   Lab Results  Component Value Date   TSH 0.834 02/05/2020   Lab Results  Component Value Date   HGBA1C 5.3 01/25/2018   Lab Results  Component Value Date   WBC 6.0 02/05/2020   HGB 14.7 02/05/2020   HCT 43.1 02/05/2020   MCV 92 02/05/2020   PLT 229 02/05/2020   Lab Results  Component Value Date   ALT 19 02/05/2020   AST 24 02/05/2020   ALKPHOS 147 (H) 02/05/2020   BILITOT 0.6 02/05/2020     Review of Systems  Constitutional: Negative for fatigue and unexpected weight change.  HENT: Negative for nosebleeds.   Eyes: Negative for visual disturbance.  Respiratory: Negative for cough, chest tightness, shortness of breath and wheezing.   Cardiovascular: Negative for chest pain, palpitations and leg swelling.  Gastrointestinal: Negative for abdominal pain, constipation and diarrhea.  Neurological: Negative for dizziness, weakness, light-headedness and headaches.    Patient Active Problem List    Diagnosis Date Noted  . Plantar fasciitis, left 01/22/2017  . Female stress incontinence 01/21/2016  . Benign essential HTN 12/18/2014  . Mixed hyperlipidemia 10/24/2014  . FH: CAD (coronary artery disease) 10/24/2014  . FH: stroke 10/24/2014    Allergies  Allergen Reactions  . Penicillin V Potassium Rash    Past Surgical History:  Procedure Laterality Date  . BREAST BIOPSY Right    neg  . BREAST BIOPSY Left 04/01/2020   stereo, distortion, path pending ribbon clip  . COLONOSCOPY  08/21/2008   normal    Social History   Tobacco Use  . Smoking status: Never Smoker  . Smokeless tobacco: Never Used  . Tobacco comment: smoking cessation materials not required  Vaping Use  . Vaping Use: Never used  Substance Use Topics  . Alcohol use: Yes    Alcohol/week: 2.0 standard drinks    Types: 2 Glasses of wine per week    Comment: 1-2 times per week  . Drug use: No     Medication list has been reviewed and updated.  Current Meds  Medication Sig  . atorvastatin (LIPITOR) 10 MG tablet Take 1 tablet (10 mg total) by mouth daily.  Marland Kitchen losartan (COZAAR) 50 MG tablet Take 1 tablet (50 mg total) by mouth daily.  . metoprolol succinate (TOPROL XL) 100 MG 24 hr tablet Take 1 tablet (100 mg total) by mouth daily. Take with or immediately following a meal.  . Multiple Vitamin (MULTIVITAMIN) tablet Take  1 tablet by mouth daily.    PHQ 2/9 Scores 08/07/2020 02/05/2020 01/29/2020 08/10/2019  PHQ - 2 Score 0 0 0 0  PHQ- 9 Score 2 3 - 0    GAD 7 : Generalized Anxiety Score 08/07/2020 02/05/2020 08/10/2019  Nervous, Anxious, on Edge 0 0 0  Control/stop worrying 0 0 0  Worry too much - different things 0 0 0  Trouble relaxing 0 0 0  Restless 0 0 0  Easily annoyed or irritable 0 0 0  Afraid - awful might happen 0 0 0  Total GAD 7 Score 0 0 0  Anxiety Difficulty - - Not difficult at all    BP Readings from Last 3 Encounters:  08/07/20 134/76  02/05/20 139/82  08/10/19 (!) 142/92     Physical Exam Vitals and nursing note reviewed.  Constitutional:      General: She is not in acute distress.    Appearance: She is well-developed.  HENT:     Head: Normocephalic and atraumatic.  Cardiovascular:     Rate and Rhythm: Normal rate and regular rhythm.     Pulses: Normal pulses.     Heart sounds: No murmur heard.   Pulmonary:     Effort: Pulmonary effort is normal. No respiratory distress.     Breath sounds: No wheezing or rhonchi.  Musculoskeletal:     Cervical back: Normal range of motion.     Right lower leg: No edema.     Left lower leg: No edema.  Lymphadenopathy:     Cervical: No cervical adenopathy.  Skin:    General: Skin is warm and dry.     Findings: No rash.  Neurological:     General: No focal deficit present.     Mental Status: She is alert and oriented to person, place, and time.  Psychiatric:        Mood and Affect: Mood normal.        Behavior: Behavior normal.     Wt Readings from Last 3 Encounters:  08/07/20 169 lb (76.7 kg)  02/05/20 172 lb (78 kg)  08/10/19 174 lb (78.9 kg)    BP 134/76   Pulse 64   Temp 98.1 F (36.7 C) (Oral)   Ht 5\' 5"  (1.651 m)   Wt 169 lb (76.7 kg)   SpO2 97%   BMI 28.12 kg/m   Assessment and Plan: 1. Benign essential HTN Clinically stable exam with well controlled BP. Tolerating medications without side effects at this time. Pt to continue current regimen and low sodium diet; benefits of regular exercise as able discussed.  2. Abnormal mammogram of left breast Due for left Dx in July -  - MM DIAG BREAST TOMO UNI LEFT; Future - US BREAST LTD UNI LEFT INC AXILLA; Future  3. Need for shingles vaccine May obtain at local pharmacy at her convenience.  I recommend waiting until after her mammogram. - Zoster Vaccine Adjuvanted [facility]) injection; Inject 0.5 mLs into the muscle once for 1 dose.  Dispense: 0.5 mL; Refill: 1   Partially dictated using Editor, commissioning. Any errors are  unintentional.  Halina Maidens, MD [facility]  08/07/2020

## 2020-09-10 DIAGNOSIS — D2262 Melanocytic nevi of left upper limb, including shoulder: Secondary | ICD-10-CM | POA: Diagnosis not present

## 2020-09-10 DIAGNOSIS — L821 Other seborrheic keratosis: Secondary | ICD-10-CM | POA: Diagnosis not present

## 2020-09-10 DIAGNOSIS — C44622 Squamous cell carcinoma of skin of right upper limb, including shoulder: Secondary | ICD-10-CM | POA: Diagnosis not present

## 2020-09-10 DIAGNOSIS — L578 Other skin changes due to chronic exposure to nonionizing radiation: Secondary | ICD-10-CM | POA: Diagnosis not present

## 2020-09-10 DIAGNOSIS — D2261 Melanocytic nevi of right upper limb, including shoulder: Secondary | ICD-10-CM | POA: Diagnosis not present

## 2020-09-10 DIAGNOSIS — D225 Melanocytic nevi of trunk: Secondary | ICD-10-CM | POA: Diagnosis not present

## 2020-09-10 DIAGNOSIS — L918 Other hypertrophic disorders of the skin: Secondary | ICD-10-CM | POA: Diagnosis not present

## 2020-09-10 DIAGNOSIS — D1801 Hemangioma of skin and subcutaneous tissue: Secondary | ICD-10-CM | POA: Diagnosis not present

## 2020-09-10 DIAGNOSIS — L570 Actinic keratosis: Secondary | ICD-10-CM | POA: Diagnosis not present

## 2020-09-10 DIAGNOSIS — D485 Neoplasm of uncertain behavior of skin: Secondary | ICD-10-CM | POA: Diagnosis not present

## 2020-09-30 ENCOUNTER — Ambulatory Visit
Admission: RE | Admit: 2020-09-30 | Discharge: 2020-09-30 | Disposition: A | Discharge status: Home / Self Care | Payer: Medicare HMO | Source: Ambulatory Visit | Attending: Internal Medicine | Admitting: Internal Medicine

## 2020-09-30 ENCOUNTER — Other Ambulatory Visit: Payer: Self-pay

## 2020-09-30 DIAGNOSIS — R928 Other abnormal and inconclusive findings on diagnostic imaging of breast: Secondary | ICD-10-CM

## 2020-09-30 DIAGNOSIS — R922 Inconclusive mammogram: Secondary | ICD-10-CM | POA: Diagnosis not present

## 2020-10-04 ENCOUNTER — Telehealth: Payer: Self-pay

## 2020-10-04 NOTE — Telephone Encounter (Signed)
Copied from [facility] [id number]. Topic: General - Other >> Oct 04, 2020  2:43 PM Pawlus, Brayton Layman A wrote: Reason for CRM: Pt called in regarding her latest imaging results, pt had some questions she wanted to discuss.

## 2020-10-07 NOTE — Telephone Encounter (Signed)
Spoke to pt.   KP

## 2020-10-07 NOTE — Telephone Encounter (Signed)
Has an appt with [facility] Aug. 18th to get exam and schedule for surgery.  KP

## 2020-10-10 DIAGNOSIS — C44622 Squamous cell carcinoma of skin of right upper limb, including shoulder: Secondary | ICD-10-CM | POA: Diagnosis not present

## 2020-10-24 ENCOUNTER — Other Ambulatory Visit: Payer: Self-pay | Admitting: General Surgery

## 2020-10-24 DIAGNOSIS — N6489 Other specified disorders of breast: Secondary | ICD-10-CM | POA: Diagnosis not present

## 2020-10-24 NOTE — Progress Notes (Signed)
Subjective:     Patient ID: Heidi Powell is a 70 y.o. female.   HPI   The following portions of the patient's history were reviewed and updated as appropriate.   This an established patient is here today for: office visit. She is her for her followup mammogram. Patient had a left stereotactic biopsy completed on 04-01-20 at [facility], radial scar/complex sclerosing lesion. She states no new breast issues but feels like she wants the area removed.     Review of Systems  Constitutional: Negative for chills and fever.  Respiratory: Negative for cough.          Chief Complaint  Patient presents with   Follow-up      BP 132/86   Pulse 71   Temp 36.7 C (98 F)   Ht 162.6 cm ('5\' 4"'$ )   Wt 76.2 kg (168 lb)   SpO2 97%   BMI 28.84 kg/m        Past Medical History:  Diagnosis Date   External hemorrhoids     HSV infection     Hyperlipidemia     Hypertension     Syncopal episodes             Past Surgical History:  Procedure Laterality Date   BREAST EXCISIONAL BIOPSY Left 04/01/2020    stereotactic   COLONOSCOPY   10/22/2008    Int hemorrhoids: [facility] 10/2018 Recall ltr mailed    TUBAL LIGATION       Wisdom teeth removal                     OB History     Gravida  1   Para  1   Term      Preterm      AB      Living         SAB      IAB      Ectopic      Molar      Multiple      Live Births           Obstetric Comments  Age at first period 68 Age of first pregnancy 42             Social History          Socioeconomic History   Marital status: Married  Tobacco Use   Smoking status: Never Smoker   Smokeless tobacco: Never Used  Substance and Sexual Activity   Alcohol use: Yes            Allergies  Allergen Reactions   Penicillin V Potassium Rash      Current Medications        Current Outpatient Medications  Medication Sig Dispense Refill   atorvastatin (LIPITOR) 10 MG tablet Take 10 mg by mouth once daily.   12   losartan  (COZAAR) 100 MG tablet Take 100 mg by mouth once daily.       metoprolol succinate (TOPROL-XL) 100 MG XL tablet Take by mouth once daily       multivit-min/iron/folic acid/K (ADULTS MULTIVITAMIN ORAL) Take by mouth        No current facility-administered medications for this visit.             Family History  Problem Relation Age of Onset   High blood pressure (Hypertension) Mother     Kidney cancer Mother     Myocardial Infarction (Heart attack) Father     High blood  pressure (Hypertension) Father     Coronary Artery Disease (Blocked arteries around heart) Father     Stroke Brother     Stroke Paternal Grandfather          sibling   Hyperlipidemia (Elevated cholesterol) Paternal Grandfather          sibling   Breast cancer Neg Hx     Colon cancer Neg Hx             Objective:   Physical Exam Exam conducted with a chaperone present.  Constitutional:      Appearance: Normal appearance.  Cardiovascular:     Rate and Rhythm: Normal rate and regular rhythm.     Pulses: Normal pulses.     Heart sounds: Normal heart sounds.  Pulmonary:     Effort: Pulmonary effort is normal.     Breath sounds: Normal breath sounds.  Chest:  Breasts:     Right: Normal.     Left: Normal.       Comments: Bra size: 38 D. Musculoskeletal:       Arms:     Cervical back: Neck supple.  Lymphadenopathy:     Upper Body:     Right upper body: No supraclavicular or axillary adenopathy.     Left upper body: No supraclavicular or axillary adenopathy.  Skin:    General: Skin is warm and dry.  Neurological:     Mental Status: She is alert and oriented to person, place, and time.  Psychiatric:        Mood and Affect: Mood normal.        Behavior: Behavior normal.      Labs and Radiology:    Left breast diagnostic mammogram of September 30, 2020:   This study was independently reviewed.  No interval change from postbiopsy films of January 2022.     April 01, 2020 stereotactic biopsy:     DIAGNOSIS:  A. LEFT BREAST, UPPER OUTER QUADRANT; STEREOTACTIC BIOPSY:  - COMPLEX SCLEROSING LESION.  - NEGATIVE FOR ATYPIA AND MALIGNANCY.    Estimated 4.5 cm of tissue was removed with this.  Conversation with pathology suggested the lesion was 9 mm or less in diameter.      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   Patient desired to proceed to excision.    Plan:     We reviewed the biopsy procedure.  With the clip notably displaced from the biopsy cavity, we will arrange for wire localization.   This to be scheduled at a convenient date as an outpatient procedure.      This note is partially prepared by MKarie Fetch RN, acting as a scribe in the presence of Dr. JHervey Ard MD.  The documentation recorded by the scribe accurately reflects the service I personally performed and the decisions made by me.    JRobert Bellow MD FACS

## 2020-10-25 ENCOUNTER — Emergency Department
Admission: EM | Admit: 2020-10-25 | Discharge: 2020-10-25 | Disposition: A | Discharge status: Home / Self Care | Payer: Medicare HMO | Attending: Emergency Medicine | Admitting: Emergency Medicine

## 2020-10-25 ENCOUNTER — Other Ambulatory Visit: Payer: Self-pay

## 2020-10-25 ENCOUNTER — Emergency Department: Payer: Medicare HMO

## 2020-10-25 ENCOUNTER — Encounter: Payer: Self-pay | Admitting: Emergency Medicine

## 2020-10-25 DIAGNOSIS — Z79899 Other long term (current) drug therapy: Secondary | ICD-10-CM | POA: Diagnosis not present

## 2020-10-25 DIAGNOSIS — W19XXXA Unspecified fall, initial encounter: Secondary | ICD-10-CM | POA: Diagnosis not present

## 2020-10-25 DIAGNOSIS — S51812A Laceration without foreign body of left forearm, initial encounter: Secondary | ICD-10-CM | POA: Insufficient documentation

## 2020-10-25 DIAGNOSIS — Y9389 Activity, other specified: Secondary | ICD-10-CM | POA: Diagnosis not present

## 2020-10-25 DIAGNOSIS — I10 Essential (primary) hypertension: Secondary | ICD-10-CM | POA: Insufficient documentation

## 2020-10-25 DIAGNOSIS — W01198A Fall on same level from slipping, tripping and stumbling with subsequent striking against other object, initial encounter: Secondary | ICD-10-CM | POA: Diagnosis not present

## 2020-10-25 DIAGNOSIS — R55 Syncope and collapse: Secondary | ICD-10-CM | POA: Diagnosis not present

## 2020-10-25 DIAGNOSIS — Y92017 Garden or yard in single-family (private) house as the place of occurrence of the external cause: Secondary | ICD-10-CM | POA: Insufficient documentation

## 2020-10-25 DIAGNOSIS — Z743 Need for continuous supervision: Secondary | ICD-10-CM | POA: Diagnosis not present

## 2020-10-25 DIAGNOSIS — S59912A Unspecified injury of left forearm, initial encounter: Secondary | ICD-10-CM | POA: Diagnosis present

## 2020-10-25 DIAGNOSIS — R231 Pallor: Secondary | ICD-10-CM | POA: Diagnosis not present

## 2020-10-25 DIAGNOSIS — Z23 Encounter for immunization: Secondary | ICD-10-CM | POA: Diagnosis not present

## 2020-10-25 DIAGNOSIS — R58 Hemorrhage, not elsewhere classified: Secondary | ICD-10-CM | POA: Diagnosis not present

## 2020-10-25 DIAGNOSIS — R001 Bradycardia, unspecified: Secondary | ICD-10-CM | POA: Diagnosis not present

## 2020-10-25 LAB — COMPREHENSIVE METABOLIC PANEL
ALT: 16 U/L (ref 0–44)
AST: 23 U/L (ref 15–41)
Albumin: 4 g/dL (ref 3.5–5.0)
Alkaline Phosphatase: 100 U/L (ref 38–126)
Anion gap: 11 (ref 5–15)
BUN: 17 mg/dL (ref 8–23)
CO2: 22 mmol/L (ref 22–32)
Calcium: 9.2 mg/dL (ref 8.9–10.3)
Chloride: 106 mmol/L (ref 98–111)
Creatinine, Ser: 1.25 mg/dL — ABNORMAL HIGH (ref 0.44–1.00)
GFR, Estimated: 47 mL/min — ABNORMAL LOW (ref >60)
Glucose, Bld: 135 mg/dL — ABNORMAL HIGH (ref 70–99)
Potassium: 4.4 mmol/L (ref 3.5–5.1)
Sodium: 139 mmol/L (ref 135–145)
Total Bilirubin: 1.1 mg/dL (ref 0.3–1.2)
Total Protein: 7.4 g/dL (ref 6.5–8.1)

## 2020-10-25 LAB — CBC WITH DIFFERENTIAL/PLATELET
Abs Immature Granulocytes: 0.06 10*3/uL (ref 0.00–0.07)
Basophils Absolute: 0 10*3/uL (ref 0.0–0.1)
Basophils Relative: 0 %
Eosinophils Absolute: 0.1 10*3/uL (ref 0.0–0.5)
Eosinophils Relative: 1 %
HCT: 37.6 % (ref 36.0–46.0)
Hemoglobin: 13.3 g/dL (ref 12.0–15.0)
Immature Granulocytes: 1 %
Lymphocytes Relative: 14 %
Lymphs Abs: 1.4 10*3/uL (ref 0.7–4.0)
MCH: 32.5 pg (ref 26.0–34.0)
MCHC: 35.4 g/dL (ref 30.0–36.0)
MCV: 91.9 fL (ref 80.0–100.0)
Monocytes Absolute: 0.6 10*3/uL (ref 0.1–1.0)
Monocytes Relative: 6 %
Neutro Abs: 7.8 10*3/uL — ABNORMAL HIGH (ref 1.7–7.7)
Neutrophils Relative %: 78 %
Platelets: 223 10*3/uL (ref 150–400)
RBC: 4.09 MIL/uL (ref 3.87–5.11)
RDW: 12.2 % (ref 11.5–15.5)
WBC: 10.1 10*3/uL (ref 4.0–10.5)
nRBC: 0 % (ref 0.0–0.2)

## 2020-10-25 MED ORDER — LIDOCAINE-EPINEPHRINE 2 %-1:100000 IJ SOLN
20.0000 mL | Freq: Once | INTRAMUSCULAR | Status: AC
  Administered 2020-10-25: 20 mL via INTRADERMAL

## 2020-10-25 MED ORDER — TETANUS-DIPHTH-ACELL PERTUSSIS 5-2.5-18.5 LF-MCG/0.5 IM SUSY
0.5000 mL | PREFILLED_SYRINGE | Freq: Once | INTRAMUSCULAR | Status: AC
  Administered 2020-10-25: 0.5 mL via INTRAMUSCULAR
  Filled 2020-10-25: qty 0.5

## 2020-10-25 MED ORDER — ACETAMINOPHEN 500 MG PO TABS
1000.0000 mg | ORAL_TABLET | Freq: Once | ORAL | Status: AC
  Administered 2020-10-25: 1000 mg via ORAL
  Filled 2020-10-25: qty 2

## 2020-10-25 MED ORDER — IBUPROFEN 400 MG PO TABS
400.0000 mg | ORAL_TABLET | Freq: Once | ORAL | Status: AC
  Administered 2020-10-25: 400 mg via ORAL
  Filled 2020-10-25: qty 1

## 2020-10-25 NOTE — ED Triage Notes (Signed)
Presents via EMS s/p fall  states she was working in the yard  lost her balance   fell backwards  hitting left f/a on house   large laceration/skin tear   Then she had a syncopal episode

## 2020-10-25 NOTE — ED Provider Notes (Signed)
[facility] Emergency Department Provider Note  ____________________________________________   Event Date/Time   First MD Initiated Contact with Patient 10/25/20 1211     (approximate)  I have reviewed the triage vital signs and the nursing notes.   HISTORY  Chief Complaint No chief complaint on file.   HPI Heidi Powell is a 70 y.o. female with past medical history HTN, HDL, vasovagal syncope, stress incontinence and plantar fasciitis who presents via EMS for assessment after patient had a fall and a syncopal episode.  Patient states she was working in her garden and thinks it made minimal dehydrated when she slipped and cut her left forearm against something in ER possibly a fence.  She states that after this happened she was going back to her house she sat down and passed out at that point.  She did not have any loss of consciousness when she actually fell before the fall or immediately after.  She states she does not think she ever hit her head and has no headache, neck pain, abdominal pain, chest pain has not had any recent nausea, vomiting, diarrhea, cough, shortness of breath or any other recent sick symptoms.  She is not sure when her last tetanus shot is she states she only hurts in her left forearm where she hit it.  No other acute concerns at this time.         Past Medical History:  Diagnosis Date   Hyperlipidemia    Hypertension    Vasovagal syncope 10/24/2014    Patient Active Problem List   Diagnosis Date Noted   Plantar fasciitis, left 01/22/2017   Female stress incontinence 01/21/2016   Benign essential HTN 12/18/2014   Mixed hyperlipidemia 10/24/2014   FH: CAD (coronary artery disease) 10/24/2014   FH: stroke 10/24/2014    Past Surgical History:  Procedure Laterality Date   BREAST BIOPSY Right    neg   BREAST BIOPSY Left 04/01/2020   stereo, distortion, path pending ribbon clip   COLONOSCOPY  08/21/2008   normal     Prior to Admission medications   Medication Sig Start Date End Date Taking? Authorizing Provider  atorvastatin (LIPITOR) 10 MG tablet Take 1 tablet (10 mg total) by mouth daily. 02/05/20   Glean Hess, MD  losartan (COZAAR) 50 MG tablet Take 1 tablet (50 mg total) by mouth daily. 02/05/20   Glean Hess, MD  metoprolol succinate (TOPROL XL) 100 MG 24 hr tablet Take 1 tablet (100 mg total) by mouth daily. Take with or immediately following a meal. 02/05/20   Glean Hess, MD  Multiple Vitamin (MULTIVITAMIN) tablet Take 1 tablet by mouth daily.    [provider]    Allergies Penicillin v potassium  Family History  Problem Relation Age of Onset   Hypertension Mother    Renal cancer Mother    Heart disease Father    Hypertension Father    Stroke Brother    Breast cancer Neg Hx     Social History Social History   Tobacco Use   Smoking status: Never   Smokeless tobacco: Never   Tobacco comments:    smoking cessation materials not required  Vaping Use   Vaping Use: Never used  Substance Use Topics   Alcohol use: Yes    Alcohol/week: 2.0 standard drinks    Types: 2 Glasses of wine per week    Comment: 1-2 times per week   Drug use: No    Review  of Systems  Review of Systems  Constitutional:  Negative for chills and fever.  HENT:  Negative for sore throat.   Eyes:  Negative for pain.  Respiratory:  Negative for cough and stridor.   Cardiovascular:  Negative for chest pain.  Gastrointestinal:  Negative for vomiting.  Genitourinary:  Negative for dysuria.  Musculoskeletal:  Positive for myalgias (L forearm).  Skin:  Negative for rash.  Neurological:  Positive for loss of consciousness. Negative for seizures and headaches.  Psychiatric/Behavioral:  Negative for suicidal ideas.   All other systems reviewed and are negative.    ____________________________________________   PHYSICAL EXAM:  VITAL SIGNS: ED Triage Vitals  Enc Vitals Group      BP      Pulse      Resp      Temp      Temp src      SpO2      Weight      Height      Head Circumference      Peak Flow      Pain Score      Pain Loc      Pain Edu?      Excl. in [place]?    Vitals:   10/25/20 1218  BP: (!) 145/83  Pulse: 62  Resp: 18  Temp: 97.6 F (36.4 C)  SpO2: 100%   Physical Exam Vitals and nursing note reviewed.  Constitutional:      General: She is not in acute distress.    Appearance: She is well-developed.  HENT:     Head: Normocephalic and atraumatic.     Right Ear: External ear normal.     Left Ear: External ear normal.     Nose: Nose normal.  Eyes:     Conjunctiva/sclera: Conjunctivae normal.  Cardiovascular:     Rate and Rhythm: Normal rate and regular rhythm.     Heart sounds: No murmur heard. Pulmonary:     Effort: Pulmonary effort is normal. No respiratory distress.     Breath sounds: Normal breath sounds.  Abdominal:     Palpations: Abdomen is soft.     Tenderness: There is no abdominal tenderness.  Musculoskeletal:     Cervical back: Neck supple.  Skin:    General: Skin is warm and dry.     Capillary Refill: Capillary refill takes less than 2 seconds.  Neurological:     Mental Status: She is alert and oriented to person, place, and time.    Proximal 4 cm curved linear superficial laceration over the dorsum of the left midforearm.  No other obvious trauma to the extremities although there is evidence of recent skin biopsies patient notes she had of her bilateral forearms couple days ago.  Sensation is intact in the distribution of the ulnar radial and median nerves in the bilateral extremities.  2+ radial pulses.  No tenderness over the snuffbox bilaterally or any evidence of trauma to the digits.  Cranial nerves II through XII grossly intact.  No palpable or visual trauma to the face scalp head or neck.  There is no tenderness or step-offs over the C/T/L-spine.  Patient has symmetric strength in her bilateral upper and lower  extremities. ____________________________________________   LABS (all labs ordered are listed, but only abnormal results are displayed)  Labs Reviewed  CBC WITH DIFFERENTIAL/PLATELET - Abnormal; Notable for the following components:      Result Value   Neutro Abs 7.8 (*)    All other components  within normal limits  COMPREHENSIVE METABOLIC PANEL - Abnormal; Notable for the following components:   Glucose, Bld 135 (*)    Creatinine, Ser 1.25 (*)    GFR, Estimated 47 (*)    All other components within normal limits   ____________________________________________  EKG  Sinus bradycardia with ventricular to 56, normal axis, unremarkable intervals without clear evidence of acute ischemia or significant arrhythmia. ____________________________________________  RADIOLOGY  ED MD interpretation: Plain film of the left forearm shows no fracture or dislocation.  Official radiology report(s): DG Forearm Left  Result Date: 10/25/2020 CLINICAL DATA:  Left forearm laceration after fall. EXAM: LEFT FOREARM - 2 VIEW COMPARISON:  None. FINDINGS: There is no evidence of fracture or other focal bone lesions. Soft tissues are unremarkable. IMPRESSION: Negative. Electronically Signed   By: Marijo Conception M.D.   On: 10/25/2020 13:44    ____________________________________________   PROCEDURES  Procedure(s) performed (including Critical Care):  Marland KitchenMarland KitchenLaceration Repair  Date/Time: 10/25/2020 1:06 PM Performed by: Lucrezia Starch, MD Authorized by: Lucrezia Starch, MD   Consent:    Consent obtained:  Verbal   Consent given by:  Patient   Risks, benefits, and alternatives were discussed: yes     Risks discussed:  Pain, need for additional repair and infection Laceration details:    Length (cm):  4 Exploration:    Limited defect created (wound extended): no     Hemostasis achieved with:  Direct pressure   Imaging obtained: x-ray     Imaging outcome: foreign body not noted     Contaminated:  no   Treatment:    Area cleansed with:  Saline   Amount of cleaning:  Extensive   Irrigation solution:  Sterile water   Irrigation method:  Syringe   Visualized foreign bodies/material removed: no     Debridement:  None   Undermining:  None   Scar revision: no   Skin repair:    Repair method:  Sutures   Suture size:  3-0   Suture material:  Nylon   Suture technique:  Simple interrupted   Number of sutures:  6 Approximation:    Approximation:  Close Repair type:    Repair type:  Simple Post-procedure details:    Procedure completion:  Tolerated well, no immediate complications .1-3 Lead EKG Interpretation  Date/Time: 10/25/2020 2:06 PM Performed by: Lucrezia Starch, MD Authorized by: Lucrezia Starch, MD     Interpretation: non-specific     ECG rate assessment: bradycardic     Rhythm: sinus bradycardia     Ectopy: none     Conduction: normal     ____________________________________________   INITIAL IMPRESSION / ASSESSMENT AND PLAN / ED COURSE      Patient presents with above to history exam after a fall in which she sustained a laceration to her forearm was followed by a syncopal episode after she walked back to her house and was looking for wound.  On arrival patient is hypertensive otherwise stable vital signs on room air.  With regard to her fall it seems she sustained a fairly decent laceration repaired per procedure note above to perform.  She is otherwise neurovascularly intact and there is no other evidence of significant trauma on exam.  I have a low suspicion for occult visceral trauma.  Tetanus was updated.  Wound was extensively irrigated and cleaned.  With regard to syncopal episode that followed I suspect this may have been vasovagal as patient has a history of this and occurred  when patient was examining her wound.  Is also possible she was mildly overheated as she states she been working in the heat all day although now feels much better and does not feel  lightheaded or dizzy at all and has been able to drink.  EKG shows rate of 56 which I suspect is related to her metoprolol and I suspect is likely not the cause of her syncope she is not  Lightheaded or dizzy in the ED and is not orthostatic here.  However advised that she may be more likely to syncopized if she is orthostatic or is has a vagal vagal episode and that she should discuss this with her PCP.  She denies any chest pain and there is no evidence on ECG of ischemia.  She has a nonfocal neuro exam and I have a low suspicion for CVA.  CBC shows no evidence of acute anemia or leukocytosis.  CMP shows no significant electrolyte or metabolic derangements.  -Stable vitals with otherwise reassuring exam work-up I think patient is stable for discharge with outpatient follow-up.  Discharged in stable condition.  Strict return precautions advised and discussed.          ____________________________________________   FINAL CLINICAL IMPRESSION(S) / ED DIAGNOSES  Final diagnoses:  Syncope, unspecified syncope type  Laceration of left forearm, initial encounter    Medications  acetaminophen (TYLENOL) tablet 1,000 mg (1,000 mg Oral Given 10/25/20 1236)  ibuprofen (ADVIL) tablet 400 mg (400 mg Oral Given 10/25/20 1236)  lidocaine-EPINEPHrine (XYLOCAINE W/EPI) 2 %-1:100000 (with pres) injection 20 mL (20 mLs Intradermal Given 10/25/20 1239)  Tdap (BOOSTRIX) injection 0.5 mL (0.5 mLs Intramuscular Given 10/25/20 1237)     ED Discharge Orders     None        Note:  This document was prepared using Dragon voice recognition software and may include unintentional dictation errors.    Lucrezia Starch, MD 10/25/20 [id number]

## 2020-10-28 ENCOUNTER — Ambulatory Visit: Payer: Medicare HMO | Admitting: Internal Medicine

## 2020-10-28 ENCOUNTER — Other Ambulatory Visit: Payer: Self-pay | Admitting: General Surgery

## 2020-10-28 DIAGNOSIS — N6489 Other specified disorders of breast: Secondary | ICD-10-CM

## 2020-11-04 ENCOUNTER — Other Ambulatory Visit: Payer: Self-pay

## 2020-11-04 ENCOUNTER — Encounter: Payer: Self-pay | Admitting: Internal Medicine

## 2020-11-04 ENCOUNTER — Ambulatory Visit (INDEPENDENT_AMBULATORY_CARE_PROVIDER_SITE_OTHER): Payer: Medicare HMO | Admitting: Internal Medicine

## 2020-11-04 VITALS — BP 164/104 | HR 74 | Temp 98.0°F | Ht 65.0 in | Wt 165.0 lb

## 2020-11-04 DIAGNOSIS — N6489 Other specified disorders of breast: Secondary | ICD-10-CM | POA: Diagnosis not present

## 2020-11-04 DIAGNOSIS — Z4802 Encounter for removal of sutures: Secondary | ICD-10-CM | POA: Diagnosis not present

## 2020-11-04 NOTE — Progress Notes (Signed)
Date:  11/04/2020   Name:  Heidi Powell   DOB:  [birth year]   MRN:  [id number]   Chief Complaint: Suture / Staple Removal (Left forearm) Sutures placed on left forearm ten days ago.  The wound is healing but remains tender.  No drainage or bleeding. She received a tetanus booster in the ED. HPI  Lab Results  Component Value Date   CREATININE 1.25 (H) 10/25/2020   BUN 17 10/25/2020   NA 139 10/25/2020   K 4.4 10/25/2020   CL 106 10/25/2020   CO2 22 10/25/2020   Lab Results  Component Value Date   CHOL 213 (H) 02/05/2020   HDL 61 02/05/2020   LDLCALC 126 (H) 02/05/2020   TRIG 145 02/05/2020   CHOLHDL 3.5 02/05/2020   Lab Results  Component Value Date   TSH 0.834 02/05/2020   Lab Results  Component Value Date   HGBA1C 5.3 01/25/2018   Lab Results  Component Value Date   WBC 10.1 10/25/2020   HGB 13.3 10/25/2020   HCT 37.6 10/25/2020   MCV 91.9 10/25/2020   PLT 223 10/25/2020   Lab Results  Component Value Date   ALT 16 10/25/2020   AST 23 10/25/2020   ALKPHOS 100 10/25/2020   BILITOT 1.1 10/25/2020     Review of Systems  Constitutional:  Negative for chills and diaphoresis.  Musculoskeletal:  Negative for arthralgias.  Skin:  Positive for wound.   Patient Active Problem List   Diagnosis Date Noted   Plantar fasciitis, left 01/22/2017   Female stress incontinence 01/21/2016   Benign essential HTN 12/18/2014   Mixed hyperlipidemia 10/24/2014   FH: CAD (coronary artery disease) 10/24/2014   FH: stroke 10/24/2014    Allergies  Allergen Reactions   Penicillin V Potassium Rash    Past Surgical History:  Procedure Laterality Date   BREAST BIOPSY Right    neg   BREAST BIOPSY Left 04/01/2020   stereo, distortion, path pending ribbon clip   COLONOSCOPY  08/21/2008   normal    Social History   Tobacco Use   Smoking status: Never   Smokeless tobacco: Never   Tobacco comments:    smoking cessation materials not required  Vaping Use    Vaping Use: Never used  Substance Use Topics   Alcohol use: Yes    Alcohol/week: 2.0 standard drinks    Types: 2 Glasses of wine per week   Drug use: Never     Medication list has been reviewed and updated.  Current Meds  Medication Sig   metoprolol succinate (TOPROL XL) 100 MG 24 hr tablet Take 1 tablet (100 mg total) by mouth daily. Take with or immediately following a meal.    PHQ 2/9 Scores 11/04/2020 08/07/2020 02/05/2020 01/29/2020  PHQ - 2 Score 0 0 0 0  PHQ- 9 Score '2 2 3 '$ -    GAD 7 : Generalized Anxiety Score 11/04/2020 08/07/2020 02/05/2020 08/10/2019  Nervous, Anxious, on Edge 0 0 0 0  Control/stop worrying 0 0 0 0  Worry too much - different things 0 0 0 0  Trouble relaxing 0 0 0 0  Restless 0 0 0 0  Easily annoyed or irritable 0 0 0 0  Afraid - awful might happen 0 0 0 0  Total GAD 7 Score 0 0 0 0  Anxiety Difficulty Not difficult at all - - Not difficult at all    BP Readings from Last 3 Encounters:  11/04/20 (!) 164/104  10/25/20 (!) 145/83  08/07/20 134/76    Physical Exam Vitals and nursing note reviewed.  Constitutional:      General: She is not in acute distress.    Appearance: She is well-developed.  HENT:     Head: Normocephalic and atraumatic.  Pulmonary:     Effort: Pulmonary effort is normal. No respiratory distress.  Skin:    General: Skin is warm and dry.     Findings: No rash.          Comments: Laceration with healing, mild inflammation.  Sutures intact - 6 interrupted sutures removed.  TAO and loose covering applied.  Neurological:     Mental Status: She is alert and oriented to person, place, and time.  Psychiatric:        Mood and Affect: Mood normal.        Behavior: Behavior normal.    Wt Readings from Last 3 Encounters:  11/04/20 165 lb (74.8 kg)  10/25/20 169 lb 1.5 oz (76.7 kg)  08/07/20 169 lb (76.7 kg)    BP (!) 164/104 (BP Location: Right Arm, Patient Position: Sitting, Cuff Size: Normal)   Pulse 74   Temp 98 F  (36.7 C) (Oral)   Ht '5\' 5"'$  (1.651 m)   Wt 165 lb (74.8 kg)   SpO2 97%   BMI 27.46 kg/m   Assessment and Plan: 1. Encounter for removal of sutures Sutures removed. Dressing applied. Local care discussed. Expect routine healing. Return if any concerns.  2. Radial scar of left breast Planning excision in 2 weeks by Dr. Bary Castilla.   Partially dictated using Editor, commissioning. Any errors are unintentional.  Halina Maidens, MD [facility]  11/04/2020

## 2020-11-08 ENCOUNTER — Other Ambulatory Visit: Payer: Self-pay

## 2020-11-08 ENCOUNTER — Encounter
Admission: RE | Admit: 2020-11-08 | Discharge: 2020-11-08 | Disposition: A | Discharge status: Home / Self Care | Payer: Medicare HMO | Source: Ambulatory Visit | Attending: General Surgery | Admitting: General Surgery

## 2020-11-08 NOTE — Patient Instructions (Signed)
Your procedure is scheduled on: 11/20/20 Report to [facility] [phone] SAME DAY SURGERY [phone] .  Remember: Instructions that are not followed completely may result in serious medical risk, up to and including death, or upon the discretion of your surgeon and anesthesiologist your surgery may need to be rescheduled.     _X__ 1. Do not eat food after midnight the night before your procedure.                 No gum chewing or hard candies. You may drink clear liquids up to 2 hours                 before you are scheduled to arrive for your surgery- DO not drink clear                 liquids within 2 hours of the start of your surgery.                 Clear Liquids include:  water, apple juice without pulp, clear carbohydrate                 drink such as Clearfast or Gatorade, Black Coffee or Tea (Do not add                 anything to coffee or tea). Diabetics water only  __X__2.  On the morning of surgery brush your teeth with toothpaste and water, you                 may rinse your mouth with mouthwash if you wish.  Do not swallow any              toothpaste of mouthwash.     _X__ 3.  No Alcohol for 24 hours before or after surgery.   _X__ 4.  Do Not Smoke or use e-cigarettes For 24 Hours Prior to Your Surgery.                 Do not use any chewable tobacco products for at least 6 hours prior to                 surgery.  ____  5.  Bring all medications with you on the day of surgery if instructed.   __X__  6.  Notify your doctor if there is any change in your medical condition      (cold, fever, infections).     Do not wear jewelry, make-up, hairpins, clips or nail polish. Do not wear lotions, powders, or perfumes. NO DEODORANT Do not shave body hair 48 hours prior to surgery. Men may shave face and neck. Do not bring valuables to the hospital.    [facility] is not responsible for any belongings or valuables.  Contacts,  dentures/partials or body piercings may not be worn into surgery. Bring a case for your contacts, glasses or hearing aids, a denture cup will be supplied. Leave your suitcase in the car. After surgery it may be brought to your room. For patients admitted to the hospital, discharge time is determined by your treatment team.   Patients discharged the day of surgery will not be allowed to drive home.   Please read over the following fact sheets that you were given:     __X__ Take these medicines the morning of surgery with A SIP OF WATER:    1. atorvastatin (LIPITOR) 10 MG tablet  2. metoprolol  succinate (TOPROL XL) 100 MG 24 hr tablet  3.   4.  5.  6.  ____ Fleet Enema (as directed)   __X__ Use CHG Soap/SAGE wipes as directed May pick this up at [facility] in [place] [address] or you may use Dial Soap. Shower the night before surgery and the morning of surgery  ____ Use inhalers on the day of surgery  ____ Stop metformin/Janumet/Farxiga 2 days prior to surgery    ____ Take 1/2 of usual insulin dose the night before surgery. No insulin the morning          of surgery.   ____ Stop Blood Thinners Coumadin/Plavix/Xarelto/Pleta/Pradaxa/Eliquis/Effient/Aspirin  on   Or contact your Surgeon, Cardiologist or Medical Doctor regarding  ability to stop your blood thinners  __X__ Stop Anti-inflammatories 7 days before surgery such as Advil, Ibuprofen, Motrin,  BC or Goodies Powder, Naprosyn, Naproxen, Aleve, Aspirin    __X__ Stop all herbal supplements, fish oil or vitamin E until after surgery.    ____ Bring C-Pap to the hospital.

## 2020-11-15 ENCOUNTER — Ambulatory Visit: Payer: Medicare HMO | Admitting: Internal Medicine

## 2020-11-20 ENCOUNTER — Other Ambulatory Visit: Payer: Self-pay

## 2020-11-20 ENCOUNTER — Ambulatory Visit: Payer: Medicare HMO | Admitting: Anesthesiology

## 2020-11-20 ENCOUNTER — Ambulatory Visit
Admission: RE | Admit: 2020-11-20 | Discharge: 2020-11-20 | Disposition: A | Discharge status: Home / Self Care | Payer: Medicare HMO | Source: Ambulatory Visit | Attending: General Surgery | Admitting: General Surgery

## 2020-11-20 ENCOUNTER — Ambulatory Visit
Admission: RE | Admit: 2020-11-20 | Discharge: 2020-11-20 | Disposition: A | Discharge status: Home / Self Care | Payer: Medicare HMO | Attending: General Surgery | Admitting: General Surgery

## 2020-11-20 ENCOUNTER — Encounter
Admission: RE | Disposition: A | Discharge status: Home / Self Care | Payer: Self-pay | Source: Home / Self Care | Attending: General Surgery

## 2020-11-20 ENCOUNTER — Encounter: Payer: Self-pay | Admitting: General Surgery

## 2020-11-20 DIAGNOSIS — N6489 Other specified disorders of breast: Secondary | ICD-10-CM | POA: Diagnosis not present

## 2020-11-20 DIAGNOSIS — L905 Scar conditions and fibrosis of skin: Secondary | ICD-10-CM | POA: Diagnosis not present

## 2020-11-20 DIAGNOSIS — N6022 Fibroadenosis of left breast: Secondary | ICD-10-CM | POA: Insufficient documentation

## 2020-11-20 DIAGNOSIS — Z880 Allergy status to penicillin: Secondary | ICD-10-CM | POA: Insufficient documentation

## 2020-11-20 DIAGNOSIS — Z419 Encounter for procedure for purposes other than remedying health state, unspecified: Secondary | ICD-10-CM

## 2020-11-20 DIAGNOSIS — Z79899 Other long term (current) drug therapy: Secondary | ICD-10-CM | POA: Diagnosis not present

## 2020-11-20 DIAGNOSIS — R928 Other abnormal and inconclusive findings on diagnostic imaging of breast: Secondary | ICD-10-CM | POA: Diagnosis not present

## 2020-11-20 HISTORY — PX: BREAST BIOPSY: SHX20

## 2020-11-20 HISTORY — PX: BREAST EXCISIONAL BIOPSY: SUR124

## 2020-11-20 SURGERY — BREAST BIOPSY WITH NEEDLE LOCALIZATION
Anesthesia: General | Laterality: Left

## 2020-11-20 MED ORDER — FENTANYL CITRATE (PF) 100 MCG/2ML IJ SOLN: 25.0000 ug | INTRAMUSCULAR | Status: DC | PRN

## 2020-11-20 MED ORDER — LACTATED RINGERS IV SOLN: INTRAVENOUS | Status: DC

## 2020-11-20 MED ORDER — FENTANYL CITRATE (PF) 250 MCG/5ML IJ SOLN
INTRAMUSCULAR | Status: AC
  Filled 2020-11-20: qty 5

## 2020-11-20 MED ORDER — CHLORHEXIDINE GLUCONATE 0.12 % MT SOLN
OROMUCOSAL | Status: AC
  Administered 2020-11-20: 15 mL via OROMUCOSAL
  Filled 2020-11-20: qty 15

## 2020-11-20 MED ORDER — EPHEDRINE SULFATE 50 MG/ML IJ SOLN
INTRAMUSCULAR | Status: DC | PRN
  Administered 2020-11-20 (×3): 5 mg via INTRAVENOUS

## 2020-11-20 MED ORDER — CHLORHEXIDINE GLUCONATE CLOTH 2 % EX PADS: 6.0000 | MEDICATED_PAD | Freq: Once | CUTANEOUS | Status: DC

## 2020-11-20 MED ORDER — STERILE WATER FOR IRRIGATION IR SOLN
Status: DC | PRN
  Administered 2020-11-20: 100 mL

## 2020-11-20 MED ORDER — PROPOFOL 10 MG/ML IV BOLUS
INTRAVENOUS | Status: DC | PRN
Start: 2020-11-20 — End: 2020-11-20
  Administered 2020-11-20: 70 mg via INTRAVENOUS

## 2020-11-20 MED ORDER — ONDANSETRON HCL 4 MG/2ML IJ SOLN
INTRAMUSCULAR | Status: DC | PRN
  Administered 2020-11-20: 4 mg via INTRAVENOUS

## 2020-11-20 MED ORDER — FENTANYL CITRATE (PF) 100 MCG/2ML IJ SOLN
INTRAMUSCULAR | Status: DC | PRN
  Administered 2020-11-20: 25 ug via INTRAVENOUS
  Administered 2020-11-20: 50 ug via INTRAVENOUS

## 2020-11-20 MED ORDER — MEPERIDINE HCL 25 MG/ML IJ SOLN: 6.2500 mg | INTRAMUSCULAR | Status: DC | PRN

## 2020-11-20 MED ORDER — TRAMADOL HCL 50 MG PO TABS: 50.0000 mg | ORAL_TABLET | ORAL | 0 refills | Status: DC | PRN

## 2020-11-20 MED ORDER — EPHEDRINE 5 MG/ML INJ
INTRAVENOUS | Status: AC
  Filled 2020-11-20: qty 5

## 2020-11-20 MED ORDER — ONDANSETRON HCL 4 MG/2ML IJ SOLN: 4.0000 mg | Freq: Once | INTRAMUSCULAR | Status: DC | PRN

## 2020-11-20 MED ORDER — ACETAMINOPHEN 10 MG/ML IV SOLN
INTRAVENOUS | Status: AC
  Filled 2020-11-20: qty 100

## 2020-11-20 MED ORDER — MIDAZOLAM HCL 2 MG/2ML IJ SOLN
INTRAMUSCULAR | Status: DC | PRN
  Administered 2020-11-20 (×2): 1 mg via INTRAVENOUS

## 2020-11-20 MED ORDER — OXYCODONE HCL 5 MG PO TABS: 5.0000 mg | ORAL_TABLET | Freq: Once | ORAL | Status: DC | PRN

## 2020-11-20 MED ORDER — KETAMINE HCL 50 MG/ML IJ SOLN
INTRAMUSCULAR | Status: DC | PRN
  Administered 2020-11-20: 50 mg via INTRAMUSCULAR

## 2020-11-20 MED ORDER — DEXAMETHASONE SODIUM PHOSPHATE 10 MG/ML IJ SOLN
INTRAMUSCULAR | Status: DC | PRN
  Administered 2020-11-20: 10 mg via INTRAVENOUS

## 2020-11-20 MED ORDER — BUPIVACAINE-EPINEPHRINE (PF) 0.5% -1:200000 IJ SOLN
INTRAMUSCULAR | Status: AC
  Filled 2020-11-20: qty 30

## 2020-11-20 MED ORDER — CHLORHEXIDINE GLUCONATE 0.12 % MT SOLN: 15.0000 mL | Freq: Once | OROMUCOSAL | Status: AC

## 2020-11-20 MED ORDER — PROPOFOL 500 MG/50ML IV EMUL
INTRAVENOUS | Status: AC
  Filled 2020-11-20: qty 50

## 2020-11-20 MED ORDER — DEXAMETHASONE SODIUM PHOSPHATE 10 MG/ML IJ SOLN
INTRAMUSCULAR | Status: AC
  Filled 2020-11-20: qty 1

## 2020-11-20 MED ORDER — ORAL CARE MOUTH RINSE: 15.0000 mL | Freq: Once | OROMUCOSAL | Status: AC

## 2020-11-20 MED ORDER — FAMOTIDINE 20 MG PO TABS
ORAL_TABLET | ORAL | Status: AC
  Administered 2020-11-20: 20 mg via ORAL
  Filled 2020-11-20: qty 1

## 2020-11-20 MED ORDER — MIDAZOLAM HCL 2 MG/2ML IJ SOLN
INTRAMUSCULAR | Status: AC
  Filled 2020-11-20: qty 2

## 2020-11-20 MED ORDER — LIDOCAINE HCL (PF) 2 % IJ SOLN
INTRAMUSCULAR | Status: AC
  Filled 2020-11-20: qty 5

## 2020-11-20 MED ORDER — DEXMEDETOMIDINE (PRECEDEX) IN NS 20 MCG/5ML (4 MCG/ML) IV SYRINGE
PREFILLED_SYRINGE | INTRAVENOUS | Status: DC | PRN
  Administered 2020-11-20: 4 ug via INTRAVENOUS
  Administered 2020-11-20: 8 ug via INTRAVENOUS

## 2020-11-20 MED ORDER — PROPOFOL 500 MG/50ML IV EMUL
INTRAVENOUS | Status: DC | PRN
  Administered 2020-11-20: 200 ug/kg/min via INTRAVENOUS

## 2020-11-20 MED ORDER — BUPIVACAINE-EPINEPHRINE (PF) 0.5% -1:200000 IJ SOLN
INTRAMUSCULAR | Status: DC | PRN
  Administered 2020-11-20: 30 mL via PERINEURAL

## 2020-11-20 MED ORDER — OXYCODONE HCL 5 MG/5ML PO SOLN
5.0000 mg | Freq: Once | ORAL | Status: DC | PRN
Start: 2020-11-20 — End: 2020-11-20

## 2020-11-20 MED ORDER — ACETAMINOPHEN 10 MG/ML IV SOLN
INTRAVENOUS | Status: DC | PRN
  Administered 2020-11-20: 1000 mg via INTRAVENOUS

## 2020-11-20 MED ORDER — DEXMEDETOMIDINE (PRECEDEX) IN NS 20 MCG/5ML (4 MCG/ML) IV SYRINGE
PREFILLED_SYRINGE | INTRAVENOUS | Status: AC
  Filled 2020-11-20: qty 5

## 2020-11-20 MED ORDER — FAMOTIDINE 20 MG PO TABS: 20.0000 mg | ORAL_TABLET | Freq: Once | ORAL | Status: AC

## 2020-11-20 MED ORDER — ONDANSETRON HCL 4 MG/2ML IJ SOLN
INTRAMUSCULAR | Status: AC
  Filled 2020-11-20: qty 2

## 2020-11-20 SURGICAL SUPPLY — 32 items
BLADE SURG 15 STRL SS SAFETY (BLADE) ×4 IMPLANT
CHLORAPREP W/TINT 26 (MISCELLANEOUS) ×4 IMPLANT
COVER PROBE FLX POLY STRL (MISCELLANEOUS) ×2 IMPLANT
DERMABOND ADVANCED (GAUZE/BANDAGES/DRESSINGS) ×1
DERMABOND ADVANCED .7 DNX12 (GAUZE/BANDAGES/DRESSINGS) ×1 IMPLANT
DEVICE DUBIN SPECIMEN MAMMOGRA (MISCELLANEOUS) ×2 IMPLANT
DRAPE LAPAROTOMY 100X77 ABD (DRAPES) ×2 IMPLANT
DRSG GAUZE FLUFF 36X18 (GAUZE/BANDAGES/DRESSINGS) ×2 IMPLANT
ELECT CAUTERY BLADE TIP 2.5 (TIP) ×2
ELECT REM PT RETURN 9FT ADLT (ELECTROSURGICAL) ×2
ELECTRODE CAUTERY BLDE TIP 2.5 (TIP) ×1 IMPLANT
ELECTRODE REM PT RTRN 9FT ADLT (ELECTROSURGICAL) ×1 IMPLANT
GAUZE 4X4 16PLY ~~LOC~~+RFID DBL (SPONGE) ×2 IMPLANT
GLOVE SURG ENC MOIS LTX SZ7.5 (GLOVE) ×2 IMPLANT
GLOVE SURG UNDER LTX SZ8 (GLOVE) ×2 IMPLANT
GOWN STRL REUS W/ TWL LRG LVL3 (GOWN DISPOSABLE) ×2 IMPLANT
GOWN STRL REUS W/TWL LRG LVL3 (GOWN DISPOSABLE) ×2
KIT TURNOVER KIT A (KITS) ×2 IMPLANT
LABEL OR SOLS (LABEL) ×2 IMPLANT
MANIFOLD NEPTUNE II (INSTRUMENTS) ×2 IMPLANT
MARGIN MAP 10MM (MISCELLANEOUS) ×2 IMPLANT
NEEDLE HYPO 22GX1.5 SAFETY (NEEDLE) ×2 IMPLANT
NEEDLE HYPO 25X1 1.5 SAFETY (NEEDLE) ×2 IMPLANT
PACK BASIN MINOR ARMC (MISCELLANEOUS) ×2 IMPLANT
SUT ETHILON 3-0 FS-10 30 BLK (SUTURE) ×2
SUT VIC AB 2-0 CT1 27 (SUTURE) ×1
SUT VIC AB 2-0 CT1 TAPERPNT 27 (SUTURE) ×1 IMPLANT
SUT VIC AB 4-0 FS2 27 (SUTURE) ×2 IMPLANT
SUTURE EHLN 3-0 FS-10 30 BLK (SUTURE) ×1 IMPLANT
SYR 10ML LL (SYRINGE) ×2 IMPLANT
WATER STERILE IRR 1000ML POUR (IV SOLUTION) ×2 IMPLANT
WATER STERILE IRR 500ML POUR (IV SOLUTION) ×2 IMPLANT

## 2020-11-20 NOTE — H&P (Signed)
Heidi Powell [id number] Feb 18, 1951     HPI: 70 y/o with radial scar of the left breast. No change on interval exam. Desires excision. Experienced a vaso-vagal response during needle placement. Feels fine now.   Medications Prior to Admission  Medication Sig Dispense Refill Last Dose   atorvastatin (LIPITOR) 10 MG tablet Take 1 tablet (10 mg total) by mouth daily. 90 tablet 3 11/20/2020   losartan (COZAAR) 50 MG tablet Take 1 tablet (50 mg total) by mouth daily. 90 tablet 3 11/19/2020   metoprolol succinate (TOPROL XL) 100 MG 24 hr tablet Take 1 tablet (100 mg total) by mouth daily. Take with or immediately following a meal. 90 tablet 3 11/20/2020   Multiple Vitamin (MULTIVITAMIN) tablet Take 1 tablet by mouth daily.   Past Week   Allergies  Allergen Reactions   Penicillin V Potassium Rash   Past Medical History:  Diagnosis Date   Hyperlipidemia    Hypertension    Vasovagal syncope 10/24/2014   Past Surgical History:  Procedure Laterality Date   BREAST BIOPSY Right    neg   BREAST BIOPSY Left 04/01/2020   stereo, distortion, ribbon clip-COMPLEX SCLEROSING LESION.   BREAST EXCISIONAL BIOPSY Left 11/20/2020   excision-COMPLEX SCLEROSING LESION.   COLONOSCOPY  08/21/2008   normal   FRACTURE SURGERY Right    arm no hardware   TUBAL LIGATION     Social History   Socioeconomic History   Marital status: Divorced    Spouse name: Not on file   Number of children: 1   Years of education: Not on file   Highest education level: Not on file  Occupational History   Not on file  Tobacco Use   Smoking status: Never   Smokeless tobacco: Never   Tobacco comments:    smoking cessation materials not required  Vaping Use   Vaping Use: Never used  Substance and Sexual Activity   Alcohol use: Yes    Alcohol/week: 2.0 standard drinks    Types: 2 Glasses of wine per week   Drug use: Never   Sexual activity: Not Currently    Partners: Male  Other Topics Concern   Not on file   Social History Narrative   Pt lives alone at home .   Social Determinants of Health   Financial Resource Strain: Low Risk    Difficulty of Paying Living Expenses: Not hard at all  Food Insecurity: No Food Insecurity   Worried About Charity fundraiser in the Last Year: Never true   Gray in the Last Year: Never true  Transportation Needs: No Transportation Needs   Lack of Transportation (Medical): No   Lack of Transportation (Non-Medical): No  Physical Activity: Sufficiently Active   Days of Exercise per Week: 7 days   Minutes of Exercise per Session: 30 min  Stress: No Stress Concern Present   Feeling of Stress : Not at all  Social Connections: Socially Isolated   Frequency of Communication with Friends and Family: More than three times a week   Frequency of Social Gatherings with Friends and Family: Three times a week   Attends Religious Services: Never   Active Member of Clubs or Organizations: No   Attends Archivist Meetings: Never   Marital Status: Divorced  Human resources officer Violence: Not At Risk   Fear of Current or Ex-Partner: No   Emotionally Abused: No   Physically Abused: No   Sexually Abused: No   Social History  Social History Narrative   Pt lives alone at home .     ROS: Negative.     PE: HEENT: Negative. Lungs: Clear. Cardio: RR.  Assessment/Plan:  Proceed with planned excision radial scar left breast.    Forest Gleason [facility] 11/20/2020

## 2020-11-20 NOTE — Anesthesia Procedure Notes (Signed)
Procedure Name: LMA Insertion Date/Time: 11/20/2020 12:21 PM Performed by: Johnna Acosta, CRNA Pre-anesthesia Checklist: Patient identified, Emergency Drugs available, Suction available, Patient being monitored and Timeout performed Patient Re-evaluated:Patient Re-evaluated prior to induction Oxygen Delivery Method: Circle system utilized Preoxygenation: Pre-oxygenation with 100% oxygen Induction Type: IV induction LMA: LMA inserted LMA Size: 3.5 Tube type: Oral Number of attempts: 1 Placement Confirmation: positive ETCO2 and breath sounds checked- equal and bilateral Tube secured with: Tape Dental Injury: Teeth and Oropharynx as per pre-operative assessment

## 2020-11-20 NOTE — Op Note (Signed)
Preoperative diagnosis: Radial scar left breast.  Postoperative diagnosis: Same.  Operative procedure: Left breast biopsy with wire and ultrasound guidance.  Operating Surgeon: Hervey Ard, MD.  Anesthesia: General by LMA, Marcaine 0.5% with 1: 200,000 units of epinephrine, 30 cc.  Estimated blood loss: Less than 2 cc.  Clinical note: This 70 year old woman had an abnormal mammogram 3 months ago and biopsy showed evidence of radial scar.  It was very small and observation was planned.  On 85-monthfollow-up there is been no interval change.  She has become anxious about the possibility of malignancy and was elected to proceed to excision.  She underwent wire localization prior to the procedure.  In conversation with the radiologist the wire was anterior to the previously placed clip in the area of distortion.  Antibiotics were not indicated.  SCD stockings for DVT prevention.  Operative note: With the patient under adequate general anesthesia the breast was taped medially to provide better exposure.  The area was cleansed with ChloraPrep and draped.  Ultrasound was used to identify the tip of the wire.  Images were obtained.  Local anesthesia was infiltrated at the 3 o'clock position where a radial incision was made.  The skin was incised sharply and the remaining dissection completed with electrocautery.  The localizing wire was brought to the operative field and then a 2 x 2 x 4 cm block of tissue was excised orientated and specimen radiograph confirmed the intact tip and the previously placed clip.  Telephone report from pathology showed the area of fibrosis previously biopsied was identified.  The deep tissue was approximated in layers with interrupted 2-0 Vicryl figure-of-eight sutures.  The skin was closed with a running 4-0 Vicryl subcuticular suture.  Dermabond was applied to the skin.  The patient tolerated procedure well and was taken to recovery in stable condition.

## 2020-11-20 NOTE — Transfer of Care (Signed)
Immediate Anesthesia Transfer of Care Note  Patient: Heidi Powell  Procedure(s) Performed: BREAST BIOPSY WITH NEEDLE LOCALIZATION (Left)  Patient Location: PACU  Anesthesia Type:General  Level of Consciousness: sedated  Airway & Oxygen Therapy: Patient Spontanous Breathing and Patient connected to face mask oxygen  Post-op Assessment: Report given to RN and Post -op Vital signs reviewed and stable  Post vital signs: Reviewed and stable  Last Vitals:  Vitals Value Taken Time  BP 100/53 11/20/20 1243  Temp    Pulse 62 11/20/20 1243  Resp 9 11/20/20 1243  SpO2 100 % 11/20/20 1243  Vitals shown include unvalidated device data.  Last Pain:  Vitals:   11/20/20 0951  TempSrc: Oral         Complications: No notable events documented.

## 2020-11-20 NOTE — Discharge Instructions (Signed)

## 2020-11-20 NOTE — Anesthesia Postprocedure Evaluation (Signed)
Anesthesia Post Note  Patient: Heidi Powell  Procedure(s) Performed: BREAST BIOPSY WITH NEEDLE LOCALIZATION (Left)  Patient location during evaluation: PACU Anesthesia Type: General Level of consciousness: awake and alert Pain management: pain level controlled Vital Signs Assessment: post-procedure vital signs reviewed and stable Respiratory status: spontaneous breathing, nonlabored ventilation, respiratory function stable and patient connected to nasal cannula oxygen Cardiovascular status: blood pressure returned to baseline and stable Postop Assessment: no apparent nausea or vomiting Anesthetic complications: no   No notable events documented.   Last Vitals:  Vitals:   11/20/20 1307 11/20/20 1320  BP:  (!) 153/67  Pulse: 69 72  Resp: 14 16  Temp: (!) 36.1 C 36.6 C  SpO2: 98% 98%    Last Pain:  Vitals:   11/20/20 1320  TempSrc: Temporal  PainSc: 0-No pain                 Harl Wiechmann Doyne Keel

## 2020-11-20 NOTE — Anesthesia Preprocedure Evaluation (Addendum)
Anesthesia Evaluation  Patient identified by MRN, date of birth, ID band Patient awake    Reviewed: Allergy & Precautions, NPO status , Patient's Chart, lab work & pertinent test results, reviewed documented beta blocker date and time   Airway Mallampati: I  TM Distance: >3 FB Neck ROM: Full    Dental no notable dental hx.    Pulmonary    Pulmonary exam normal        Cardiovascular hypertension, Pt. on medications Normal cardiovascular examI     Neuro/Psych    GI/Hepatic   Endo/Other    Renal/GU      Musculoskeletal   Abdominal Normal abdominal exam  (+)   Peds  Hematology   Anesthesia Other Findings   Reproductive/Obstetrics                             Anesthesia Physical Anesthesia Plan  ASA: 2  Anesthesia Plan: General   Post-op Pain Management:    Induction: Intravenous  PONV Risk Score and Plan:   Airway Management Planned: Simple Face Mask and Natural Airway  Additional Equipment: None  Intra-op Plan:   Post-operative Plan: Extubation in OR  Informed Consent: I have reviewed the patients History and Physical, chart, labs and discussed the procedure including the risks, benefits and alternatives for the proposed anesthesia with the patient or authorized representative who has indicated his/her understanding and acceptance.       Plan Discussed with: CRNA  Anesthesia Plan Comments:         Anesthesia Quick Evaluation

## 2020-11-21 ENCOUNTER — Encounter: Payer: Self-pay | Admitting: General Surgery

## 2020-11-22 LAB — SURGICAL PATHOLOGY

## 2021-01-23 DIAGNOSIS — H5203 Hypermetropia, bilateral: Secondary | ICD-10-CM | POA: Diagnosis not present

## 2021-02-03 ENCOUNTER — Other Ambulatory Visit: Payer: Self-pay | Admitting: Internal Medicine

## 2021-02-03 ENCOUNTER — Ambulatory Visit (INDEPENDENT_AMBULATORY_CARE_PROVIDER_SITE_OTHER): Payer: Medicare HMO

## 2021-02-03 DIAGNOSIS — Z0000 Encounter for general adult medical examination without abnormal findings: Secondary | ICD-10-CM | POA: Diagnosis not present

## 2021-02-03 DIAGNOSIS — Z1231 Encounter for screening mammogram for malignant neoplasm of breast: Secondary | ICD-10-CM | POA: Diagnosis not present

## 2021-02-03 NOTE — Patient Instructions (Signed)
Heidi Powell , Thank you for taking time to come for your Medicare Wellness Visit. I appreciate your ongoing commitment to your health goals. Please review the following plan we discussed and let me know if I can assist you in the future.   Screening recommendations/referrals: Colonoscopy: FOBT done 01/24/20 Mammogram: done 09/30/20. Please call [phone] to schedule your mammogram.  Bone Density: done 03/06/19 Recommended yearly ophthalmology/optometry visit for glaucoma screening and checkup Recommended yearly dental visit for hygiene and checkup  Vaccinations: Influenza vaccine: due Pneumococcal vaccine: done 02/05/20 Tdap vaccine: done 10/25/20 Shingles vaccine: done 10/07/20 & 01/28/21   Covid-19:done 06/20/19 & 07/11/19  Conditions/risks identified: Keep up the great work!  Next appointment: Follow up in one year for your annual wellness visit    Preventive Care 65 Years and Older, Female Preventive care refers to lifestyle choices and visits with your health care provider that can promote health and wellness. What does preventive care include? A yearly physical exam. This is also called an annual well check. Dental exams once or twice a year. Routine eye exams. Ask your health care provider how often you should have your eyes checked. Personal lifestyle choices, including: Daily care of your teeth and gums. Regular physical activity. Eating a healthy diet. Avoiding tobacco and drug use. Limiting alcohol use. Practicing safe sex. Taking low-dose aspirin every day. Taking vitamin and mineral supplements as recommended by your health care provider. What happens during an annual well check? The services and screenings done by your health care provider during your annual well check will depend on your age, overall health, lifestyle risk factors, and family history of disease. Counseling  Your health care provider may ask you questions about your: Alcohol use. Tobacco use. Drug  use. Emotional well-being. Home and relationship well-being. Sexual activity. Eating habits. History of falls. Memory and ability to understand (cognition). Work and work Statistician. Reproductive health. Screening  You may have the following tests or measurements: Height, weight, and BMI. Blood pressure. Lipid and cholesterol levels. These may be checked every 5 years, or more frequently if you are over 50 years old. Skin check. Lung cancer screening. You may have this screening every year starting at age 1 if you have a 30-pack-year history of smoking and currently smoke or have quit within the past 15 years. Fecal occult blood test (FOBT) of the stool. You may have this test every year starting at age 62. Flexible sigmoidoscopy or colonoscopy. You may have a sigmoidoscopy every 5 years or a colonoscopy every 10 years starting at age 84. Hepatitis C blood test. Hepatitis B blood test. Sexually transmitted disease (STD) testing. Diabetes screening. This is done by checking your blood sugar (glucose) after you have not eaten for a while (fasting). You may have this done every 1-3 years. Bone density scan. This is done to screen for osteoporosis. You may have this done starting at age 44. Mammogram. This may be done every 1-2 years. Talk to your health care provider about how often you should have regular mammograms. Talk with your health care provider about your test results, treatment options, and if necessary, the need for more tests. Vaccines  Your health care provider may recommend certain vaccines, such as: Influenza vaccine. This is recommended every year. Tetanus, diphtheria, and acellular pertussis (Tdap, Td) vaccine. You may need a Td booster every 10 years. Zoster vaccine. You may need this after age 88. Pneumococcal 13-valent conjugate (PCV13) vaccine. One dose is recommended after age 40. Pneumococcal polysaccharide (PPSV23)  vaccine. One dose is recommended after age  58. Talk to your health care provider about which screenings and vaccines you need and how often you need them. This information is not intended to replace advice given to you by your health care provider. Make sure you discuss any questions you have with your health care provider. Document Released: 03/22/2015 Document Revised: 11/13/2015 Document Reviewed: 12/25/2014 Elsevier Interactive Patient Education  2017 [place] Prevention in the Home Falls can cause injuries. They can happen to people of all ages. There are many things you can do to make your home safe and to help prevent falls. What can I do on the outside of my home? Regularly fix the edges of walkways and driveways and fix any cracks. Remove anything that might make you trip as you walk through a door, such as a raised step or threshold. Trim any bushes or trees on the path to your home. Use bright outdoor lighting. Clear any walking paths of anything that might make someone trip, such as rocks or tools. Regularly check to see if handrails are loose or broken. Make sure that both sides of any steps have handrails. Any raised decks and porches should have guardrails on the edges. Have any leaves, snow, or ice cleared regularly. Use sand or salt on walking paths during winter. Clean up any spills in your garage right away. This includes oil or grease spills. What can I do in the bathroom? Use night lights. Install grab bars by the toilet and in the tub and shower. Do not use towel bars as grab bars. Use non-skid mats or decals in the tub or shower. If you need to sit down in the shower, use a plastic, non-slip stool. Keep the floor dry. Clean up any water that spills on the floor as soon as it happens. Remove soap buildup in the tub or shower regularly. Attach bath mats securely with double-sided non-slip rug tape. Do not have throw rugs and other things on the floor that can make you trip. What can I do in the  bedroom? Use night lights. Make sure that you have a light by your bed that is easy to reach. Do not use any sheets or blankets that are too big for your bed. They should not hang down onto the floor. Have a firm chair that has side arms. You can use this for support while you get dressed. Do not have throw rugs and other things on the floor that can make you trip. What can I do in the kitchen? Clean up any spills right away. Avoid walking on wet floors. Keep items that you use a lot in easy-to-reach places. If you need to reach something above you, use a strong step stool that has a grab bar. Keep electrical cords out of the way. Do not use floor polish or wax that makes floors slippery. If you must use wax, use non-skid floor wax. Do not have throw rugs and other things on the floor that can make you trip. What can I do with my stairs? Do not leave any items on the stairs. Make sure that there are handrails on both sides of the stairs and use them. Fix handrails that are broken or loose. Make sure that handrails are as long as the stairways. Check any carpeting to make sure that it is firmly attached to the stairs. Fix any carpet that is loose or worn. Avoid having throw rugs at the top or bottom  of the stairs. If you do have throw rugs, attach them to the floor with carpet tape. Make sure that you have a light switch at the top of the stairs and the bottom of the stairs. If you do not have them, ask someone to add them for you. What else can I do to help prevent falls? Wear shoes that: Do not have high heels. Have rubber bottoms. Are comfortable and fit you well. Are closed at the toe. Do not wear sandals. If you use a stepladder: Make sure that it is fully opened. Do not climb a closed stepladder. Make sure that both sides of the stepladder are locked into place. Ask someone to hold it for you, if possible. Clearly mark and make sure that you can see: Any grab bars or  handrails. First and last steps. Where the edge of each step is. Use tools that help you move around (mobility aids) if they are needed. These include: Canes. Walkers. Scooters. Crutches. Turn on the lights when you go into a dark area. Replace any light bulbs as soon as they burn out. Set up your furniture so you have a clear path. Avoid moving your furniture around. If any of your floors are uneven, fix them. If there are any pets around you, be aware of where they are. Review your medicines with your doctor. Some medicines can make you feel dizzy. This can increase your chance of falling. Ask your doctor what other things that you can do to help prevent falls. This information is not intended to replace advice given to you by your health care provider. Make sure you discuss any questions you have with your health care provider. Document Released: 12/20/2008 Document Revised: 08/01/2015 Document Reviewed: 03/30/2014 Elsevier Interactive Patient Education  2017 [employer].

## 2021-02-03 NOTE — Progress Notes (Signed)
Subjective:   Heidi Powell is a 70 y.o. female who presents for Medicare Annual (Subsequent) preventive examination.  Virtual Visit via Telephone Note  I connected with  Heidi Powell on 02/03/21 at  9:20 AM EST by telephone and verified that I am speaking with the correct person using two identifiers.  Location: Patient: home Provider: [facility] Persons participating in the virtual visit: Heidi Powell   I discussed the limitations, risks, security and privacy concerns of performing an evaluation and management service by telephone and the availability of in person appointments. The patient expressed understanding and agreed to proceed.  Interactive audio and video telecommunications were attempted between this nurse and patient, however failed, due to patient having technical difficulties OR patient did not have access to video capability.  We continued and completed visit with audio only.  Some vital signs may be absent or patient reported.   Heidi Marker, LPN   Review of Systems     Cardiac Risk Factors include: advanced age (>68men, >30 women);hypertension;dyslipidemia     Objective:    There were no vitals filed for this visit. There is no height or weight on file to calculate BMI.  Advanced Directives 02/03/2021 11/20/2020 11/08/2020 10/25/2020 01/29/2020 01/25/2019 04/11/2015  Does Patient Have a Medical Advance Directive? Yes Yes Yes No Yes Yes Yes  Type of Paramedic of [place];Living will Living will Living will - [place];Living will [place];Living will [place];Living will  Does patient want to make changes to medical advance directive? - No - Guardian declined No - Patient declined - - - -  Copy of [place] in Chart? Yes - validated most recent copy scanned in chart (See row information) - - - No - copy requested No - copy requested -   Would patient like information on creating a medical advance directive? - No - Patient declined No - Patient declined No - Patient declined - - -    Current Medications (verified) Outpatient Encounter Medications as of 02/03/2021  Medication Sig   atorvastatin (LIPITOR) 10 MG tablet Take 1 tablet (10 mg total) by mouth daily.   losartan (COZAAR) 50 MG tablet Take 1 tablet (50 mg total) by mouth daily.   metoprolol succinate (TOPROL XL) 100 MG 24 hr tablet Take 1 tablet (100 mg total) by mouth daily. Take with or immediately following a meal.   Multiple Vitamin (MULTIVITAMIN) tablet Take 1 tablet by mouth daily.   No facility-administered encounter medications on file as of 02/03/2021.    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   BREAST BIOPSY Left 11/20/2020   Procedure: BREAST BIOPSY WITH NEEDLE LOCALIZATION;  Surgeon: Robert Bellow, MD;  Location: [facility] ORS;  Service: General;  Laterality: Left;   BREAST EXCISIONAL BIOPSY Left 11/20/2020   excision-COMPLEX SCLEROSING LESION.   COLONOSCOPY  08/21/2008   normal   FRACTURE SURGERY Right    arm no hardware   TUBAL LIGATION     Family History  Problem Relation Age of Onset   Hypertension Mother  Renal cancer Mother    Stroke Mother    Heart disease Father    Hypertension Father    Stroke Brother    Breast cancer Neg Hx    Social History   Socioeconomic History   Marital status: Divorced    Spouse name: Not on file   Number of children: 1   Years of education: Not on file   Highest education level: Not on file  Occupational History   Not on file  Tobacco Use   Smoking  status: Never   Smokeless tobacco: Never   Tobacco comments:    smoking cessation materials not required  Vaping Use   Vaping Use: Never used  Substance and Sexual Activity   Alcohol use: Yes    Alcohol/week: 2.0 standard drinks    Types: 2 Glasses of wine per week   Drug use: Never   Sexual activity: Not Currently    Partners: Male  Other Topics Concern   Not on file  Social History Narrative   Pt lives alone at home .   Social Determinants of Health   Financial Resource Strain: Low Risk    Difficulty of Paying Living Expenses: Not hard at all  Food Insecurity: No Food Insecurity   Worried About Charity fundraiser in the Last Year: Never true   [place] in the Last Year: Never true  Transportation Needs: No Transportation Needs   Lack of Transportation (Medical): No   Lack of Transportation (Non-Medical): No  Physical Activity: Sufficiently Active   Days of Exercise per Week: 7 days   Minutes of Exercise per Session: 30 min  Stress: No Stress Concern Present   Feeling of Stress : Not at all  Social Connections: Socially Isolated   Frequency of Communication with Friends and Family: More than three times a week   Frequency of Social Gatherings with Friends and Family: More than three times a week   Attends Religious Services: Never   Marine scientist or Organizations: No   Attends Music therapist: Never   Marital Status: Divorced    Tobacco Counseling Counseling given: Not Answered Tobacco comments: smoking cessation materials not required   Clinical Intake:  Pre-visit preparation completed: Yes  Pain : No/denies pain     Nutritional Risks: None Diabetes: No  How often do you need to have someone help you when you read instructions, pamphlets, or other written materials from your doctor or pharmacy?: 1 - Never    Interpreter Needed?: No  Information entered by :: Heidi Marker LPN   Activities of Daily Living In your  present state of health, do you have any difficulty performing the following activities: 02/03/2021 11/20/2020  Hearing? N N  Vision? N N  Difficulty concentrating or making decisions? N N  Walking or climbing stairs? N N  Dressing or bathing? N N  Doing errands, shopping? N -  Preparing Food and eating ? N -  Using the Toilet? N -  In the past six months, have you accidently leaked urine? N -  Do you have problems with loss of bowel control? N -  Managing your Medications? N -  Managing your Finances? N -  Housekeeping or managing your Housekeeping? N -  Some recent data might be hidden    Patient Care Team: Glean Hess, MD as PCP - General (Internal Medicine) Corey Skains, MD as Consulting Physician (Cardiology) Jannet Mantis, MD (Dermatology) Bary Castilla Forest Gleason, MD  as Consulting Physician (General Surgery)  Indicate any recent [place] you may have received from other than [facility] providers in the past year (date may be approximate).     Assessment:   This is a routine wellness examination for [place].  Hearing/Vision screen Hearing Screening - Comments:: Pt denies hearing difficulty Vision Screening - Comments:: Annual Vision Screenings done at [facility] Dr. Mallie Mussel  Dietary issues and exercise activities discussed: Current Exercise Habits: Home exercise routine, Type of exercise: walking, Time (Minutes): 30, Frequency (Times/Week): 7, Weekly Exercise (Minutes/Week): 210, Intensity: Moderate, Exercise limited by: None identified   Goals Addressed             This Visit's Progress    Patient Stated   On track    Pt states she would like to overall stay healthy and active.        Depression Screen PHQ 2/9 Scores 02/03/2021 11/04/2020 08/07/2020 02/05/2020 01/29/2020 08/10/2019 01/30/2019  PHQ - 2 Score 0 0 0 0 0 0 0  PHQ- 9 Score - 2 2 3  - 0 -    Fall Risk Fall Risk  02/03/2021 11/04/2020 08/07/2020 02/05/2020 01/29/2020  Falls in the past  year? 1 1 0 0 0  Number falls in past yr: 0 0 - - 0  Injury with Fall? 1 1 - - 0  Risk for fall due to : No Fall Risks History of fall(s) - - No Fall Risks  Follow up Falls prevention discussed Falls evaluation completed Falls evaluation completed Falls evaluation completed Falls prevention discussed    FALL RISK PREVENTION PERTAINING TO THE HOME:  Any stairs in or around the home? Yes  If so, are there any without handrails? No  Home free of loose throw rugs in walkways, pet beds, electrical cords, etc? Yes  Adequate lighting in your home to reduce risk of falls? Yes   ASSISTIVE DEVICES UTILIZED TO PREVENT FALLS:  Life alert? No  Use of a cane, walker or w/c? No  Grab bars in the bathroom? No  Shower chair or bench in shower? Yes  Elevated toilet seat or a handicapped toilet? No   TIMED UP AND GO:  Was the test performed? No . Telephonic visit.   Cognitive Function: Normal cognitive status assessed by direct observation by this Nurse Health Advisor. No abnormalities found.          Immunizations Immunization History  Administered Date(s) Administered   Fluad Quad(high Dose 65+) 01/10/2019, 02/05/2020   Influenza,inj,Quad PF,6+ Mos 01/21/2016, 01/22/2017, 01/25/2018   Influenza-Unspecified 01/21/2016   PFIZER(Purple Top)SARS-COV-2 Vaccination 06/20/2019, 07/11/2019   Pneumococcal Conjugate-13 01/30/2019   Pneumococcal Polysaccharide-23 02/05/2020   Tdap 10/25/2020   Zoster Recombinat (Shingrix) 10/07/2020, 01/28/2021    TDAP status: Up to date  Flu Vaccine status: Due, Education has been provided regarding the importance of this vaccine. Advised may receive this vaccine at local pharmacy or Health Dept. Aware to provide a copy of the vaccination record if obtained from local pharmacy or Health Dept. Verbalized acceptance and understanding.  Pneumococcal vaccine status: Up to date  Covid-19 vaccine status: Completed vaccines  Qualifies for Shingles Vaccine? Yes    Zostavax completed Yes   Shingrix Completed?: Yes  Screening Tests Health Maintenance  Topic Date Due   COVID-19 Vaccine (3 - Booster for Pfizer series) 09/05/2019   INFLUENZA VACCINE  10/07/2020   COLON CANCER SCREENING ANNUAL FOBT  01/23/2021   COLONOSCOPY (Pts 45-73yrs Insurance coverage will need to be confirmed)  02/05/2021 (Originally  08/22/2018)   MAMMOGRAM  03/06/2021   TETANUS/TDAP  10/26/2030   Pneumonia Vaccine 67+ Years old  Completed   DEXA SCAN  Completed   Hepatitis C Screening  Completed   Zoster Vaccines- Shingrix  Completed   HPV VACCINES  Aged Out    Health Maintenance  Health Maintenance Due  Topic Date Due   COVID-19 Vaccine (3 - Booster for [place] series) 09/05/2019   INFLUENZA VACCINE  10/07/2020   COLON CANCER SCREENING ANNUAL FOBT  01/23/2021    Colorectal cancer screening: Type of screening: FOBT/FIT. Completed 01/24/20. Repeat every 1 years  Mammogram status: Completed 09/30/20. Repeat every year. Due 04/02/21 for 6 month follow up. Ordered today  Bone Density status: Completed 03/06/19. Results reflect: Bone density results: OSTEOPENIA. Repeat every 2 years. Pt to dicuss repeat screening with Dr. Army Melia  Lung Cancer Screening: (Low Dose CT Chest recommended if Age 37-80 years, 30 pack-year currently smoking OR have quit w/in 15years.) does not qualify.   Additional Screening:  Hepatitis C Screening: does qualify; Completed 01/21/16  Vision Screening: Recommended annual ophthalmology exams for early detection of glaucoma and other disorders of the eye. Is the patient up to date with their annual eye exam?  Yes  Who is the provider or what is the name of the office in which the patient attends annual eye exams? [facility].   Dental Screening: Recommended annual dental exams for proper oral hygiene  Community Resource Referral / Chronic Care Management: CRR required this visit?  No   CCM required this visit?  No      Plan:     I  have personally reviewed and noted the following in the patient's chart:   Medical and social history Use of alcohol, tobacco or illicit drugs  Current medications and supplements including opioid prescriptions.  Functional ability and status Nutritional status Physical activity Advanced directives List of other physicians Hospitalizations, surgeries, and ER visits in previous 12 months Vitals Screenings to include cognitive, depression, and falls Referrals and appointments  In addition, I have reviewed and discussed with patient certain preventive protocols, quality metrics, and best practice recommendations. A written personalized care plan for preventive services as well as general preventive health recommendations were provided to patient.     Heidi Marker, LPN   [id number]   Nurse Notes: pt c/o cold sxs that started over the weekend; cough and congestion. Pt denies fever or muscle aches and advised to contact office if needed.

## 2021-02-10 ENCOUNTER — Ambulatory Visit (INDEPENDENT_AMBULATORY_CARE_PROVIDER_SITE_OTHER): Payer: Medicare HMO | Admitting: Internal Medicine

## 2021-02-10 ENCOUNTER — Encounter: Payer: Self-pay | Admitting: Internal Medicine

## 2021-02-10 ENCOUNTER — Other Ambulatory Visit: Payer: Self-pay

## 2021-02-10 VITALS — BP 124/82 | HR 53 | Ht 64.0 in | Wt 175.0 lb

## 2021-02-10 DIAGNOSIS — Z1211 Encounter for screening for malignant neoplasm of colon: Secondary | ICD-10-CM

## 2021-02-10 DIAGNOSIS — R739 Hyperglycemia, unspecified: Secondary | ICD-10-CM | POA: Diagnosis not present

## 2021-02-10 DIAGNOSIS — I10 Essential (primary) hypertension: Secondary | ICD-10-CM

## 2021-02-10 DIAGNOSIS — E782 Mixed hyperlipidemia: Secondary | ICD-10-CM

## 2021-02-10 DIAGNOSIS — Z0000 Encounter for general adult medical examination without abnormal findings: Secondary | ICD-10-CM | POA: Diagnosis not present

## 2021-02-10 DIAGNOSIS — Z23 Encounter for immunization: Secondary | ICD-10-CM

## 2021-02-10 DIAGNOSIS — Z1231 Encounter for screening mammogram for malignant neoplasm of breast: Secondary | ICD-10-CM

## 2021-02-10 LAB — POCT URINALYSIS DIPSTICK
Bilirubin, UA: NEGATIVE
Glucose, UA: NEGATIVE
Ketones, UA: NEGATIVE
Leukocytes, UA: NEGATIVE
Nitrite, UA: NEGATIVE
Protein, UA: NEGATIVE
Spec Grav, UA: 1.025 (ref 1.010–1.025)
Urobilinogen, UA: 0.2 E.U./dL
pH, UA: 6 (ref 5.0–8.0)

## 2021-02-10 MED ORDER — ATORVASTATIN CALCIUM 10 MG PO TABS: 10.0000 mg | ORAL_TABLET | Freq: Every day | ORAL | 3 refills | Status: DC

## 2021-02-10 MED ORDER — METOPROLOL SUCCINATE ER 50 MG PO TB24: 50.0000 mg | ORAL_TABLET | Freq: Every day | ORAL | 0 refills | Status: DC

## 2021-02-10 MED ORDER — LOSARTAN POTASSIUM 50 MG PO TABS: 50.0000 mg | ORAL_TABLET | Freq: Every day | ORAL | 3 refills | Status: DC

## 2021-02-10 NOTE — Patient Instructions (Signed)
Recommend calcium 1200 mg per day and Vitamin D 800 IU daily plus weight bearing exercise.

## 2021-02-10 NOTE — Progress Notes (Signed)
Date:  02/10/2021   Name:  Heidi Powell   DOB:  03/16/1950   MRN:  [id number]   Chief Complaint: Annual Exam (Breast exam no pap ) Heidi Powell is a 70 y.o. female who presents today for her Complete Annual Exam. She feels well. She reports exercising walking 7 days a week. She reports she is sleeping well. Breast complaints none.  Mammogram: 02/2020 w/6 mo f/u abnormal - biopsy 11/2020 Benign DEXA: 02/2019 osteopenia hip; normal spine Pap smear: discontinued last 01/2016 Colonoscopy: FIT 01/2020  Immunization History  Administered Date(s) Administered   Fluad Quad(high Dose 65+) 01/10/2019, 02/05/2020   Influenza,inj,Quad PF,6+ Mos 01/21/2016, 01/22/2017, 01/25/2018   Influenza-Unspecified 01/21/2016   PFIZER(Purple Top)SARS-COV-2 Vaccination 06/20/2019, 07/11/2019   Pneumococcal Conjugate-13 01/30/2019   Pneumococcal Polysaccharide-23 02/05/2020   Tdap 10/25/2020   Zoster Recombinat (Shingrix) 10/07/2020, 01/28/2021    Hypertension This is a chronic problem. The problem is controlled. Pertinent negatives include no chest pain, headaches, palpitations or shortness of breath. Past treatments include angiotensin blockers and beta blockers. The current treatment provides significant improvement.  Hyperlipidemia This is a chronic problem. The problem is controlled. Pertinent negatives include no chest pain or shortness of breath. Current antihyperlipidemic treatment includes statins.   Lab Results  Component Value Date   NA 139 10/25/2020   K 4.4 10/25/2020   CO2 22 10/25/2020   GLUCOSE 135 (H) 10/25/2020   BUN 17 10/25/2020   CREATININE 1.25 (H) 10/25/2020   CALCIUM 9.2 10/25/2020   GFRNONAA 47 (L) 10/25/2020   Lab Results  Component Value Date   CHOL 213 (H) 02/05/2020   HDL 61 02/05/2020   LDLCALC 126 (H) 02/05/2020   TRIG 145 02/05/2020   CHOLHDL 3.5 02/05/2020   Lab Results  Component Value Date   TSH 0.834 02/05/2020   Lab Results   Component Value Date   HGBA1C 5.3 01/25/2018   Lab Results  Component Value Date   WBC 10.1 10/25/2020   HGB 13.3 10/25/2020   HCT 37.6 10/25/2020   MCV 91.9 10/25/2020   PLT 223 10/25/2020   Lab Results  Component Value Date   ALT 16 10/25/2020   AST 23 10/25/2020   ALKPHOS 100 10/25/2020   BILITOT 1.1 10/25/2020   Lab Results  Component Value Date   VD25OH 39.9 11/06/2014     Review of Systems  Constitutional:  Positive for fatigue (wondering if the beta blocker is contributing). Negative for chills and fever.  HENT:  Negative for congestion, hearing loss, tinnitus, trouble swallowing and voice change.   Eyes:  Negative for visual disturbance.  Respiratory:  Negative for cough, chest tightness, shortness of breath and wheezing.   Cardiovascular:  Negative for chest pain, palpitations and leg swelling.  Gastrointestinal:  Negative for abdominal pain, constipation, diarrhea and vomiting.  Endocrine: Negative for polydipsia and polyuria.  Genitourinary:  Negative for dysuria, frequency, genital sores, vaginal bleeding and vaginal discharge.  Musculoskeletal:  Negative for arthralgias, gait problem and joint swelling.  Skin:  Negative for color change and rash.  Neurological:  Negative for dizziness, tremors, light-headedness and headaches.  Hematological:  Negative for adenopathy. Does not bruise/bleed easily.  Psychiatric/Behavioral:  Negative for dysphoric mood and sleep disturbance. The patient is not nervous/anxious.    Patient Active Problem List   Diagnosis Date Noted   Radial scar of left breast 11/04/2020   Female stress incontinence 01/21/2016   Benign essential HTN 12/18/2014   Mixed hyperlipidemia 10/24/2014  Allergies  Allergen Reactions   Penicillin V Potassium Rash    Past Surgical History:  Procedure Laterality Date   BREAST BIOPSY Right    neg   BREAST BIOPSY Left 04/01/2020   stereo, distortion, ribbon clip-COMPLEX SCLEROSING LESION.    BREAST BIOPSY Left 11/20/2020   Procedure: BREAST BIOPSY WITH NEEDLE LOCALIZATION;  Surgeon: Robert Bellow, MD;  Location: [facility] ORS;  Service: General;  Laterality: Left;   BREAST EXCISIONAL BIOPSY Left 11/20/2020   excision-COMPLEX SCLEROSING LESION.   COLONOSCOPY  08/21/2008   normal   FRACTURE SURGERY Right    arm no hardware   TUBAL LIGATION      Social History   Tobacco Use   Smoking status: Never   Smokeless tobacco: Never   Tobacco comments:    smoking cessation materials not required  Vaping Use   Vaping Use: Never used  Substance Use Topics   Alcohol use: Yes    Alcohol/week: 2.0 standard drinks    Types: 2 Glasses of wine per week   Drug use: Never     Medication list has been reviewed and updated.  Current Meds  Medication Sig   atorvastatin (LIPITOR) 10 MG tablet Take 1 tablet (10 mg total) by mouth daily.   losartan (COZAAR) 50 MG tablet Take 1 tablet (50 mg total) by mouth daily.   metoprolol succinate (TOPROL XL) 100 MG 24 hr tablet Take 1 tablet (100 mg total) by mouth daily. Take with or immediately following a meal.   Multiple Vitamin (MULTIVITAMIN) tablet Take 1 tablet by mouth daily.    PHQ 2/9 Scores 02/10/2021 02/03/2021 11/04/2020 08/07/2020  PHQ - 2 Score 0 0 0 0  PHQ- 9 Score 1 - 2 2    GAD 7 : Generalized Anxiety Score 02/10/2021 11/04/2020 08/07/2020 02/05/2020  Nervous, Anxious, on Edge 0 0 0 0  Control/stop worrying 0 0 0 0  Worry too much - different things 0 0 0 0  Trouble relaxing 0 0 0 0  Restless 0 0 0 0  Easily annoyed or irritable 0 0 0 0  Afraid - awful might happen 0 0 0 0  Total GAD 7 Score 0 0 0 0  Anxiety Difficulty - Not difficult at all - -    BP Readings from Last 3 Encounters:  02/10/21 124/82  11/20/20 (!) 153/67  11/20/20 (!) 115/52    Physical Exam Vitals and nursing note reviewed.  Constitutional:      General: She is not in acute distress.    Appearance: She is well-developed.  HENT:     Head:  Normocephalic and atraumatic.     Right Ear: Tympanic membrane and ear canal normal.     Left Ear: Tympanic membrane and ear canal normal.     Nose:     Right Sinus: No maxillary sinus tenderness.     Left Sinus: No maxillary sinus tenderness.  Eyes:     General: No scleral icterus.       Right eye: No discharge.        Left eye: No discharge.     Conjunctiva/sclera: Conjunctivae normal.  Neck:     Thyroid: No thyromegaly.     Vascular: No carotid bruit.  Cardiovascular:     Rate and Rhythm: Normal rate and regular rhythm.     Pulses: Normal pulses.     Heart sounds: Normal heart sounds.  Pulmonary:     Effort: Pulmonary effort is normal. No respiratory distress.  Breath sounds: No wheezing.  Chest:  Breasts:    Right: No mass, nipple discharge, skin change or tenderness.     Left: No mass, nipple discharge, skin change or tenderness.       Comments: Bx scar healed Abdominal:     General: Bowel sounds are normal.     Palpations: Abdomen is soft.     Tenderness: There is no abdominal tenderness.  Musculoskeletal:     Cervical back: Normal range of motion. No erythema.     Right lower leg: No edema.     Left lower leg: No edema.  Lymphadenopathy:     Cervical: No cervical adenopathy.  Skin:    General: Skin is warm and dry.     Findings: No rash.  Neurological:     Mental Status: She is alert and oriented to person, place, and time.     Cranial Nerves: No cranial nerve deficit.     Sensory: No sensory deficit.     Deep Tendon Reflexes: Reflexes are normal and symmetric.  Psychiatric:        Attention and Perception: Attention normal.        Mood and Affect: Mood normal.    Wt Readings from Last 3 Encounters:  02/10/21 175 lb (79.4 kg)  11/20/20 168 lb (76.2 kg)  11/08/20 168 lb (76.2 kg)    BP 124/82   Pulse (!) 53   Ht 5\' 4"  (1.626 m)   Wt 175 lb (79.4 kg)   BMI 30.04 kg/m   Assessment and Plan: 1. Annual physical exam Normal exam; continue regular  exercise and healthy diet Up to date on screenings and immunizations.  2. Encounter for screening mammogram for breast cancer Resume annual screenings in January - MM 3D SCREEN BREAST BILATERAL  3. Colon cancer screening - Fecal occult blood, imunochemical  4. Benign essential HTN Clinically stable exam with well controlled BP. Tolerating medications without side effects at this time. Mild fatigue may be due to metoprolol 100 mg - will reduce dose to 50 mg; pt to monitor BP and sx and return in 2 mo for recheck. Pt to continue current regimen and low sodium diet; benefits of regular exercise as able discussed.  - CBC with Differential/Platelet - Comprehensive metabolic panel - TSH - POCT urinalysis dipstick - losartan (COZAAR) 50 MG tablet; Take 1 tablet (50 mg total) by mouth daily.  Dispense: 90 tablet; Refill: 3 - metoprolol succinate (TOPROL XL) 50 MG 24 hr tablet; Take 1 tablet (50 mg total) by mouth daily. Take with or immediately following a meal.  Dispense: 90 tablet; Refill: 0  5. Mixed hyperlipidemia Tolerating statin medication without side effects at this time LDL is at goal of < 70 on current dose Continue same therapy without change at this time.  - Lipid panel - atorvastatin (LIPITOR) 10 MG tablet; Take 1 tablet (10 mg total) by mouth daily.  Dispense: 90 tablet; Refill: 3  6. Hyperglycemia Check A1C to rule out pre-diabetes - Hemoglobin A1c   Partially dictated using Editor, commissioning. Any errors are unintentional.  Halina Maidens, MD [facility]  02/10/2021

## 2021-02-11 LAB — COMPREHENSIVE METABOLIC PANEL
ALT: 19 IU/L (ref 0–32)
AST: 22 IU/L (ref 0–40)
Albumin/Globulin Ratio: 1.7 (ref 1.2–2.2)
Albumin: 4.3 g/dL (ref 3.8–4.8)
Alkaline Phosphatase: 185 IU/L — ABNORMAL HIGH (ref 44–121)
BUN/Creatinine Ratio: 15 (ref 12–28)
BUN: 15 mg/dL (ref 8–27)
Bilirubin Total: 0.4 mg/dL (ref 0.0–1.2)
CO2: 22 mmol/L (ref 20–29)
Calcium: 9.7 mg/dL (ref 8.7–10.3)
Chloride: 106 mmol/L (ref 96–106)
Creatinine, Ser: 1 mg/dL (ref 0.57–1.00)
Globulin, Total: 2.6 g/dL (ref 1.5–4.5)
Glucose: 99 mg/dL (ref 70–99)
Potassium: 4.5 mmol/L (ref 3.5–5.2)
Sodium: 142 mmol/L (ref 134–144)
Total Protein: 6.9 g/dL (ref 6.0–8.5)
eGFR: 61 mL/min/{1.73_m2} (ref >59)

## 2021-02-11 LAB — CBC WITH DIFFERENTIAL/PLATELET
Basophils Absolute: 0.1 10*3/uL (ref 0.0–0.2)
Basos: 1 %
EOS (ABSOLUTE): 0.2 10*3/uL (ref 0.0–0.4)
Eos: 2 %
Hematocrit: 40.8 % (ref 34.0–46.6)
Hemoglobin: 14.2 g/dL (ref 11.1–15.9)
Immature Grans (Abs): 0.1 10*3/uL (ref 0.0–0.1)
Immature Granulocytes: 1 %
Lymphocytes Absolute: 1.9 10*3/uL (ref 0.7–3.1)
Lymphs: 19 %
MCH: 31.6 pg (ref 26.6–33.0)
MCHC: 34.8 g/dL (ref 31.5–35.7)
MCV: 91 fL (ref 79–97)
Monocytes Absolute: 0.5 10*3/uL (ref 0.1–0.9)
Monocytes: 5 %
Neutrophils Absolute: 7 10*3/uL (ref 1.4–7.0)
Neutrophils: 72 %
Platelets: 278 10*3/uL (ref 150–450)
RBC: 4.5 x10E6/uL (ref 3.77–5.28)
RDW: 12 % (ref 11.7–15.4)
WBC: 9.7 10*3/uL (ref 3.4–10.8)

## 2021-02-11 LAB — LIPID PANEL
Chol/HDL Ratio: 3.9 ratio (ref 0.0–4.4)
Cholesterol, Total: 193 mg/dL (ref 100–199)
HDL: 50 mg/dL (ref >39)
LDL Chol Calc (NIH): 115 mg/dL — ABNORMAL HIGH (ref 0–99)
Triglycerides: 158 mg/dL — ABNORMAL HIGH (ref 0–149)
VLDL Cholesterol Cal: 28 mg/dL (ref 5–40)

## 2021-02-11 LAB — TSH: TSH: 0.538 u[IU]/mL (ref 0.450–4.500)

## 2021-02-11 LAB — HEMOGLOBIN A1C
Est. average glucose Bld gHb Est-mCnc: 117 mg/dL
Hgb A1c MFr Bld: 5.7 % — ABNORMAL HIGH (ref 4.8–5.6)

## 2021-02-13 ENCOUNTER — Other Ambulatory Visit: Payer: Self-pay | Admitting: Internal Medicine

## 2021-02-13 DIAGNOSIS — I10 Essential (primary) hypertension: Secondary | ICD-10-CM

## 2021-02-13 NOTE — Telephone Encounter (Signed)
Change in therapy 02/12/21. Decreased to 50mg 

## 2021-02-19 ENCOUNTER — Telehealth: Payer: Self-pay

## 2021-02-19 NOTE — Telephone Encounter (Signed)
Error

## 2021-02-19 NOTE — Telephone Encounter (Signed)
Called pt as a reminder to call and schedule mammogram. Pt verbalized understanding.  KP 

## 2021-02-20 ENCOUNTER — Telehealth: Payer: Self-pay

## 2021-02-20 NOTE — Telephone Encounter (Signed)
Called pt as a reminder to complete FIT test. Pt verbalized understanding.  KP

## 2021-02-21 DIAGNOSIS — Z1211 Encounter for screening for malignant neoplasm of colon: Secondary | ICD-10-CM | POA: Diagnosis not present

## 2021-02-25 LAB — FECAL OCCULT BLOOD, IMMUNOCHEMICAL: Fecal Occult Bld: NEGATIVE

## 2021-03-17 ENCOUNTER — Ambulatory Visit
Admission: RE | Admit: 2021-03-17 | Discharge: 2021-03-17 | Disposition: A | Discharge status: Home / Self Care | Payer: Medicare HMO | Source: Ambulatory Visit | Attending: Internal Medicine | Admitting: Internal Medicine

## 2021-03-17 ENCOUNTER — Other Ambulatory Visit: Payer: Self-pay

## 2021-03-17 DIAGNOSIS — Z1231 Encounter for screening mammogram for malignant neoplasm of breast: Secondary | ICD-10-CM | POA: Insufficient documentation

## 2021-04-14 ENCOUNTER — Other Ambulatory Visit: Payer: Self-pay

## 2021-04-14 ENCOUNTER — Ambulatory Visit (INDEPENDENT_AMBULATORY_CARE_PROVIDER_SITE_OTHER): Payer: Medicare HMO | Admitting: Internal Medicine

## 2021-04-14 ENCOUNTER — Encounter: Payer: Self-pay | Admitting: Internal Medicine

## 2021-04-14 VITALS — BP 136/84 | HR 82 | Ht 64.0 in | Wt 170.0 lb

## 2021-04-14 DIAGNOSIS — E782 Mixed hyperlipidemia: Secondary | ICD-10-CM | POA: Diagnosis not present

## 2021-04-14 DIAGNOSIS — F5101 Primary insomnia: Secondary | ICD-10-CM

## 2021-04-14 DIAGNOSIS — R69 Illness, unspecified: Secondary | ICD-10-CM | POA: Diagnosis not present

## 2021-04-14 DIAGNOSIS — I10 Essential (primary) hypertension: Secondary | ICD-10-CM | POA: Diagnosis not present

## 2021-04-14 MED ORDER — METOPROLOL SUCCINATE ER 50 MG PO TB24: 50.0000 mg | ORAL_TABLET | Freq: Every day | ORAL | 3 refills | Status: DC

## 2021-04-14 NOTE — Progress Notes (Signed)
Date:  04/14/2021   Name:  Heidi Powell   DOB:  1950/05/03   MRN:  [id number]   Chief Complaint: Hypertension  Hypertension This is a chronic problem. The problem is controlled. Pertinent negatives include no chest pain, headaches, palpitations or shortness of breath. Past treatments include angiotensin blockers and beta blockers. The current treatment provides significant improvement. There are no compliance problems.  There is no history of kidney disease, CAD/MI or CVA.  Insomnia Primary symptoms: sleep disturbance, frequent awakening.   The onset quality is undetermined. The problem occurs nightly. The problem is unchanged. Treatments tried: over the counter sleep aids, melatonin.   Lab Results  Component Value Date   NA 142 02/10/2021   K 4.5 02/10/2021   CO2 22 02/10/2021   GLUCOSE 99 02/10/2021   BUN 15 02/10/2021   CREATININE 1.00 02/10/2021   CALCIUM 9.7 02/10/2021   EGFR 61 02/10/2021   GFRNONAA 47 (L) 10/25/2020   Lab Results  Component Value Date   CHOL 193 02/10/2021   HDL 50 02/10/2021   LDLCALC 115 (H) 02/10/2021   TRIG 158 (H) 02/10/2021   CHOLHDL 3.9 02/10/2021   Lab Results  Component Value Date   TSH 0.538 02/10/2021   Lab Results  Component Value Date   HGBA1C 5.7 (H) 02/10/2021   Lab Results  Component Value Date   WBC 9.7 02/10/2021   HGB 14.2 02/10/2021   HCT 40.8 02/10/2021   MCV 91 02/10/2021   PLT 278 02/10/2021   Lab Results  Component Value Date   ALT 19 02/10/2021   AST 22 02/10/2021   ALKPHOS 185 (H) 02/10/2021   BILITOT 0.4 02/10/2021   Lab Results  Component Value Date   VD25OH 39.9 11/06/2014     Review of Systems  Constitutional:  Negative for chills, fatigue and unexpected weight change.  HENT:  Negative for nosebleeds.   Eyes:  Negative for visual disturbance.  Respiratory:  Negative for cough, chest tightness, shortness of breath and wheezing.   Cardiovascular:  Negative for chest pain, palpitations and  leg swelling.  Gastrointestinal:  Negative for abdominal pain, constipation and diarrhea.  Musculoskeletal:  Negative for arthralgias, gait problem and joint swelling.  Neurological:  Negative for dizziness, weakness, light-headedness and headaches.  Psychiatric/Behavioral:  Positive for sleep disturbance. Negative for dysphoric mood. The patient has insomnia. The patient is not nervous/anxious.    Patient Active Problem List   Diagnosis Date Noted   Radial scar of left breast 11/04/2020   Female stress incontinence 01/21/2016   Benign essential HTN 12/18/2014   Mixed hyperlipidemia 10/24/2014    Allergies  Allergen Reactions   Penicillin V Potassium Rash    Past Surgical History:  Procedure Laterality Date   BREAST BIOPSY Right    neg   BREAST BIOPSY Left 04/01/2020   stereo, distortion, ribbon clip-COMPLEX SCLEROSING LESION.   BREAST BIOPSY Left 11/20/2020   Procedure: BREAST BIOPSY WITH NEEDLE LOCALIZATION;  Surgeon: Robert Bellow, MD;  Location: [facility] ORS;  Service: General;  Laterality: Left;   BREAST EXCISIONAL BIOPSY Left 11/20/2020   excision-COMPLEX SCLEROSING LESION.   COLONOSCOPY  08/21/2008   normal   FRACTURE SURGERY Right    arm no hardware   TUBAL LIGATION      Social History   Tobacco Use   Smoking status: Never   Smokeless tobacco: Never   Tobacco comments:    smoking cessation materials not required  Vaping Use   Vaping Use: Never  used  Substance Use Topics   Alcohol use: Yes    Alcohol/week: 2.0 standard drinks    Types: 2 Glasses of wine per week   Drug use: Never     Medication list has been reviewed and updated.  Current Meds  Medication Sig   atorvastatin (LIPITOR) 10 MG tablet Take 1 tablet (10 mg total) by mouth daily.   losartan (COZAAR) 50 MG tablet Take 1 tablet (50 mg total) by mouth daily.   Multiple Vitamin (MULTIVITAMIN) tablet Take 1 tablet by mouth daily.   [DISCONTINUED] metoprolol succinate (TOPROL XL) 50 MG 24 hr  tablet Take 1 tablet (50 mg total) by mouth daily. Take with or immediately following a meal.    PHQ 2/9 Scores 04/14/2021 02/10/2021 02/03/2021 11/04/2020  PHQ - 2 Score 0 0 0 0  PHQ- 9 Score 1 1 - 2    GAD 7 : Generalized Anxiety Score 04/14/2021 02/10/2021 11/04/2020 08/07/2020  Nervous, Anxious, on Edge 0 0 0 0  Control/stop worrying 0 0 0 0  Worry too much - different things 0 0 0 0  Trouble relaxing 0 0 0 0  Restless 0 0 0 0  Easily annoyed or irritable 0 0 0 0  Afraid - awful might happen 0 0 0 0  Total GAD 7 Score 0 0 0 0  Anxiety Difficulty Not difficult at all - Not difficult at all -    BP Readings from Last 3 Encounters:  04/14/21 136/84  02/10/21 124/82  11/20/20 (!) 153/67    Physical Exam Vitals and nursing note reviewed.  Constitutional:      General: She is not in acute distress.    Appearance: She is well-developed.  HENT:     Head: Normocephalic and atraumatic.  Neck:     Vascular: No carotid bruit.  Cardiovascular:     Rate and Rhythm: Normal rate and regular rhythm.     Pulses: Normal pulses.  Pulmonary:     Effort: Pulmonary effort is normal. No respiratory distress.     Breath sounds: No wheezing or rhonchi.  Musculoskeletal:     Cervical back: Normal range of motion.     Right lower leg: No edema.     Left lower leg: No edema.  Lymphadenopathy:     Cervical: No cervical adenopathy.  Skin:    General: Skin is warm and dry.     Capillary Refill: Capillary refill takes less than 2 seconds.     Findings: No rash.  Neurological:     General: No focal deficit present.     Mental Status: She is alert and oriented to person, place, and time.  Psychiatric:        Mood and Affect: Mood normal.        Behavior: Behavior normal.    Wt Readings from Last 3 Encounters:  04/14/21 170 lb (77.1 kg)  02/10/21 175 lb (79.4 kg)  11/20/20 168 lb (76.2 kg)    BP 136/84 (BP Location: Right Arm, Cuff Size: Large)    Pulse 82    Ht _0  (1.626 m)    Wt 170 lb  (77.1 kg)    SpO2 95%    BMI 29.18 kg/m   Assessment and Plan: 1. Benign essential HTN Clinically stable exam with well controlled BP. Tolerating medications without side effects at this time. Pt to continue current regimen and low sodium diet; benefits of regular exercise as able discussed. - metoprolol succinate (TOPROL XL) 50 MG 24  hr tablet; Take 1 tablet (50 mg total) by mouth daily. Take with or immediately following a meal.  Dispense: 90 tablet; Refill: 3  2. Mixed hyperlipidemia Controlled on current medications Continue exercise and low fat diet  3. Primary insomnia Sample of Dayvigo 5 mg given with dosing instructions   Partially dictated using Editor, commissioning. Any errors are unintentional.  Halina Maidens, MD [facility]  04/14/2021

## 2021-04-23 DIAGNOSIS — L821 Other seborrheic keratosis: Secondary | ICD-10-CM | POA: Diagnosis not present

## 2021-04-23 DIAGNOSIS — L853 Xerosis cutis: Secondary | ICD-10-CM | POA: Diagnosis not present

## 2021-04-23 DIAGNOSIS — L908 Other atrophic disorders of skin: Secondary | ICD-10-CM | POA: Diagnosis not present

## 2021-04-23 DIAGNOSIS — L578 Other skin changes due to chronic exposure to nonionizing radiation: Secondary | ICD-10-CM | POA: Diagnosis not present

## 2021-04-23 DIAGNOSIS — D225 Melanocytic nevi of trunk: Secondary | ICD-10-CM | POA: Diagnosis not present

## 2021-04-23 DIAGNOSIS — Z859 Personal history of malignant neoplasm, unspecified: Secondary | ICD-10-CM | POA: Diagnosis not present

## 2021-04-23 DIAGNOSIS — Z872 Personal history of diseases of the skin and subcutaneous tissue: Secondary | ICD-10-CM | POA: Diagnosis not present

## 2021-06-05 ENCOUNTER — Ambulatory Visit (INDEPENDENT_AMBULATORY_CARE_PROVIDER_SITE_OTHER): Payer: Medicare HMO | Admitting: Internal Medicine

## 2021-06-05 ENCOUNTER — Ambulatory Visit
Admission: RE | Admit: 2021-06-05 | Discharge: 2021-06-05 | Disposition: A | Discharge status: Home / Self Care | Payer: Medicare HMO | Source: Ambulatory Visit | Attending: Internal Medicine | Admitting: Internal Medicine

## 2021-06-05 ENCOUNTER — Encounter: Payer: Self-pay | Admitting: Internal Medicine

## 2021-06-05 ENCOUNTER — Ambulatory Visit
Admission: RE | Admit: 2021-06-05 | Discharge: 2021-06-05 | Disposition: A | Discharge status: Home / Self Care | Payer: Medicare HMO | Attending: Internal Medicine | Admitting: Internal Medicine

## 2021-06-05 VITALS — BP 136/92 | HR 70 | Ht 64.0 in | Wt 171.0 lb

## 2021-06-05 DIAGNOSIS — M25562 Pain in left knee: Secondary | ICD-10-CM | POA: Diagnosis not present

## 2021-06-05 MED ORDER — DICLOFENAC SODIUM 75 MG PO TBEC: 75.0000 mg | DELAYED_RELEASE_TABLET | Freq: Two times a day (BID) | ORAL | 0 refills | Status: DC

## 2021-06-05 NOTE — Progress Notes (Signed)
? ? ?Date:  06/05/2021  ? ?Name:  Heidi Powell   DOB:  1950/10/23   MRN:  [id number] ? ? ?Chief Complaint: Edema (X 2 weeks, left Knee, better with ice, hasn't injured it in any way, painful sore, ibuprofen and tried tylenol arthritis hasn't helped, ) ? ?Knee Pain  ?There was no injury mechanism. The pain is present in the left knee. The quality of the pain is described as aching. The pain is moderate. The pain has been Fluctuating since onset. The symptoms are aggravated by palpation and weight bearing. She has tried ice and NSAIDs for the symptoms. Improvement on treatment: more relief with ice; intermittent nsaids use.  ? ?Lab Results  ?Component Value Date  ? NA 142 02/10/2021  ? K 4.5 02/10/2021  ? CO2 22 02/10/2021  ? GLUCOSE 99 02/10/2021  ? BUN 15 02/10/2021  ? CREATININE 1.00 02/10/2021  ? CALCIUM 9.7 02/10/2021  ? EGFR 61 02/10/2021  ? GFRNONAA 47 (L) 10/25/2020  ? ?Lab Results  ?Component Value Date  ? CHOL 193 02/10/2021  ? HDL 50 02/10/2021  ? LDLCALC 115 (H) 02/10/2021  ? TRIG 158 (H) 02/10/2021  ? CHOLHDL 3.9 02/10/2021  ? ?Lab Results  ?Component Value Date  ? TSH 0.538 02/10/2021  ? ?Lab Results  ?Component Value Date  ? HGBA1C 5.7 (H) 02/10/2021  ? ?Lab Results  ?Component Value Date  ? WBC 9.7 02/10/2021  ? HGB 14.2 02/10/2021  ? HCT 40.8 02/10/2021  ? MCV 91 02/10/2021  ? PLT 278 02/10/2021  ? ?Lab Results  ?Component Value Date  ? ALT 19 02/10/2021  ? AST 22 02/10/2021  ? ALKPHOS 185 (H) 02/10/2021  ? BILITOT 0.4 02/10/2021  ? ?Lab Results  ?Component Value Date  ? VD25OH 39.9 11/06/2014  ?  ? ?Review of Systems  ?Constitutional:  Negative for chills and fever.  ?Musculoskeletal:  Positive for arthralgias and joint swelling.  ?Psychiatric/Behavioral:  Positive for sleep disturbance. Negative for dysphoric mood. The patient is not nervous/anxious.   ? ?Patient Active Problem List  ? Diagnosis Date Noted  ? Primary insomnia 04/14/2021  ? Radial scar of left breast 11/04/2020  ? Female  stress incontinence 01/21/2016  ? Benign essential HTN 12/18/2014  ? Mixed hyperlipidemia 10/24/2014  ? ? ?Allergies  ?Allergen Reactions  ? Penicillin V Potassium Rash  ? ? ?Past Surgical History:  ?Procedure Laterality Date  ? BREAST BIOPSY Right   ? neg  ? BREAST BIOPSY Left 04/01/2020  ? stereo, distortion, ribbon clip-COMPLEX SCLEROSING LESION.  ? BREAST BIOPSY Left 11/20/2020  ? Procedure: BREAST BIOPSY WITH NEEDLE LOCALIZATION;  Surgeon: Robert Bellow, MD;  Location: [facility] ORS;  Service: General;  Laterality: Left;  ? BREAST EXCISIONAL BIOPSY Left 11/20/2020  ? excision-COMPLEX SCLEROSING LESION.  ? COLONOSCOPY  08/21/2008  ? normal  ? FRACTURE SURGERY Right   ? arm no hardware  ? TUBAL LIGATION    ? ? ?Social History  ? ?Tobacco Use  ? Smoking status: Never  ? Smokeless tobacco: Never  ? Tobacco comments:  ?  smoking cessation materials not required  ?Vaping Use  ? Vaping Use: Never used  ?Substance Use Topics  ? Alcohol use: Yes  ?  Alcohol/week: 2.0 standard drinks  ?  Types: 2 Glasses of wine per week  ? Drug use: Never  ? ? ? ?Medication list has been reviewed and updated. ? ?Current Meds  ?Medication Sig  ? Acetaminophen (TYLENOL ARTHRITIS PAIN PO) Take by  mouth as needed.  ? atorvastatin (LIPITOR) 10 MG tablet Take 1 tablet (10 mg total) by mouth daily.  ? IBUPROFEN PO Take by mouth as needed.  ? losartan (COZAAR) 50 MG tablet Take 1 tablet (50 mg total) by mouth daily.  ? metoprolol succinate (TOPROL XL) 50 MG 24 hr tablet Take 1 tablet (50 mg total) by mouth daily. Take with or immediately following a meal.  ? Multiple Vitamin (MULTIVITAMIN) tablet Take 1 tablet by mouth daily.  ? ? ? ?  06/05/2021  ? 11:09 AM 04/14/2021  ?  8:57 AM 02/10/2021  ?  8:31 AM 11/04/2020  ?  4:01 PM  ?GAD 7 : Generalized Anxiety Score  ?Nervous, Anxious, on Edge 0 0 0 0  ?Control/stop worrying 0 0 0 0  ?Worry too much - different things 0 0 0 0  ?Trouble relaxing 0 0 0 0  ?Restless 0 0 0 0  ?Easily annoyed or irritable 0 0  0 0  ?Afraid - awful might happen 0 0 0 0  ?Total GAD 7 Score 0 0 0 0  ?Anxiety Difficulty  Not difficult at all  Not difficult at all  ? ? ? ?  06/05/2021  ? 11:08 AM  ?Depression screen PHQ 2/9  ?Decreased Interest 0  ?Down, Depressed, Hopeless 0  ?PHQ - 2 Score 0  ?Altered sleeping 2  ?Tired, decreased energy 1  ?Change in appetite 0  ?Feeling bad or failure about yourself  0  ?Trouble concentrating 0  ?Moving slowly or fidgety/restless 0  ?Suicidal thoughts 0  ?PHQ-9 Score 3  ?Difficult doing work/chores Not difficult at all  ? ? ?BP Readings from Last 3 Encounters:  ?06/05/21 (!) 136/92  ?04/14/21 136/84  ?02/10/21 124/82  ? ? ?Physical Exam ?Constitutional:   ?   Appearance: Normal appearance.  ?Musculoskeletal:  ?   Right knee: Normal.  ?   Left knee: Effusion present. No deformity, erythema or crepitus. Decreased range of motion. Tenderness present over the MCL.  ?   Instability Tests: Anterior drawer test negative.  ?Neurological:  ?   Mental Status: She is alert.  ? ? ?Wt Readings from Last 3 Encounters:  ?06/05/21 171 lb (77.6 kg)  ?04/14/21 170 lb (77.1 kg)  ?02/10/21 175 lb (79.4 kg)  ? ? ?BP (!) 136/92   Pulse 70   Ht _0  (1.626 m)   Wt 171 lb (77.6 kg)   SpO2 97%   BMI 29.35 kg/m?  ? ?Assessment and Plan: ?1. Acute pain of left knee ?No acute injury known.  She is using ice with good relief. ?Will start with therapeutic NSAID dosing for several weeks then re-assess. ?Will also get imaging and refer to [facility] if needed. ?- diclofenac (VOLTAREN) 75 MG EC tablet; Take 1 tablet (75 mg total) by mouth 2 (two) times daily.  Dispense: 60 tablet; Refill: 0 ?- DG Knee Complete 4 Views Left ? ? ?Partially dictated using Editor, commissioning. Any errors are unintentional. ? ?Halina Maidens, MD ?[facility] ?[facility] ? ?06/05/2021 ? ? ? ? ?

## 2021-08-12 ENCOUNTER — Other Ambulatory Visit: Payer: Self-pay

## 2021-08-12 ENCOUNTER — Emergency Department: Payer: Medicare HMO

## 2021-08-12 ENCOUNTER — Emergency Department
Admission: EM | Admit: 2021-08-12 | Discharge: 2021-08-12 | Disposition: A | Discharge status: Home / Self Care | Payer: Medicare HMO | Attending: Emergency Medicine | Admitting: Emergency Medicine

## 2021-08-12 DIAGNOSIS — S99911A Unspecified injury of right ankle, initial encounter: Secondary | ICD-10-CM | POA: Diagnosis not present

## 2021-08-12 DIAGNOSIS — S0003XA Contusion of scalp, initial encounter: Secondary | ICD-10-CM | POA: Insufficient documentation

## 2021-08-12 DIAGNOSIS — M47812 Spondylosis without myelopathy or radiculopathy, cervical region: Secondary | ICD-10-CM | POA: Diagnosis not present

## 2021-08-12 DIAGNOSIS — Z743 Need for continuous supervision: Secondary | ICD-10-CM | POA: Diagnosis not present

## 2021-08-12 DIAGNOSIS — S0990XA Unspecified injury of head, initial encounter: Secondary | ICD-10-CM | POA: Diagnosis not present

## 2021-08-12 DIAGNOSIS — M4312 Spondylolisthesis, cervical region: Secondary | ICD-10-CM | POA: Diagnosis not present

## 2021-08-12 DIAGNOSIS — S199XXA Unspecified injury of neck, initial encounter: Secondary | ICD-10-CM | POA: Diagnosis not present

## 2021-08-12 DIAGNOSIS — S20211A Contusion of right front wall of thorax, initial encounter: Secondary | ICD-10-CM | POA: Diagnosis not present

## 2021-08-12 DIAGNOSIS — I10 Essential (primary) hypertension: Secondary | ICD-10-CM | POA: Diagnosis not present

## 2021-08-12 DIAGNOSIS — W19XXXA Unspecified fall, initial encounter: Secondary | ICD-10-CM

## 2021-08-12 DIAGNOSIS — W1789XA Other fall from one level to another, initial encounter: Secondary | ICD-10-CM | POA: Insufficient documentation

## 2021-08-12 DIAGNOSIS — S9001XA Contusion of right ankle, initial encounter: Secondary | ICD-10-CM | POA: Diagnosis not present

## 2021-08-12 DIAGNOSIS — R0781 Pleurodynia: Secondary | ICD-10-CM | POA: Diagnosis not present

## 2021-08-12 MED ORDER — MELOXICAM 7.5 MG PO TABS: 7.5000 mg | ORAL_TABLET | Freq: Every day | ORAL | 0 refills | Status: DC

## 2021-08-12 MED ORDER — METHOCARBAMOL 500 MG PO TABS: 500.0000 mg | ORAL_TABLET | Freq: Four times a day (QID) | ORAL | 0 refills | Status: DC

## 2021-08-12 NOTE — ED Triage Notes (Signed)
Pt via [facility] for fall at softball game. Pt was spectator dodging foul ball and slipped and fell 4 feet off bleachers. Pt fell to R side and then hit head. Pt denies LOC and does not take blood thinners. Abrasion to back R of head with bleeding controlled. Bruising to R ankle but pt denies pain anywhere but head. Pt A&O in NAD

## 2021-08-12 NOTE — ED Notes (Signed)
E-signature pad unavailable - Pt verbalized understanding of D/C information - no additional concerns at this time.  

## 2021-08-12 NOTE — ED Provider Notes (Signed)
[facility] Provider Note  Patient Contact: 11:09 PM (approximate)   History   Fall   HPI  Heidi Powell is a 71 y.o. female who presents the emergency department complaining of head injury, rib injury and right ankle injury after falling off a bleacher.  Patient states that she was watching her granddaughter softball game when a foul ball came at her.  Patient states that she tried to move out of the way and fell off the bleachers.  She had a 4 foot fall approximately.  She did hit her head but did not lose consciousness.  She sustained an abrasion to the occipital skull with a hematoma.  Patient is also complaining of right rib and right ankle pain.  No subsequent loss of consciousness, visual changes, neck pain, shortness of breath, substernal chest pain, abdominal complaints.  Patient is still ambulatory at this time.  Does not take blood thinners.     Physical Exam   Triage Vital Signs: ED Triage Vitals [08/12/21 2042]  Enc Vitals Group     BP (!) 139/104     Pulse Rate 82     Resp 18     Temp 98.8 F (37.1 C)     Temp src      SpO2 98 %     Weight 170 lb (77.1 kg)     Height '5\' 4"'$  (1.626 m)     Head Circumference      Peak Flow      Pain Score 7     Pain Loc      Pain Edu?      Excl. in [place]?     Most recent vital signs: Vitals:   08/12/21 2042  BP: (!) 139/104  Pulse: 82  Resp: 18  Temp: 98.8 F (37.1 C)  SpO2: 98%     General: Alert and in no acute distress. Eyes:  PERRL. EOMI. Head: Hematoma with small abrasion noted to the occipital skull.  There is some dried blood in her hair but no active bleeding.  No visible foreign body.  No battle signs, raccoon eyes, serosanguineous fluid drainage from the ears or nares.  Neck: No stridor. No cervical spine tenderness to palpation.  Cardiovascular:  Good peripheral perfusion Respiratory: Normal respiratory effort without tachypnea or retractions. Lungs CTAB. Good air entry to the  bases with no decreased or absent breath sounds Gastrointestinal: Bowel sounds 4 quadrants. Soft and nontender to palpation. No guarding or rigidity. No palpable masses. No distention. Musculoskeletal: Full range of motion to all extremities.  No obvious deformity to the right rib cage.  No paradoxical chest wall movement.  Good underlying breath sounds bilaterally.  No palpable abnormality or crepitus.  Examination of the right ankle reveals mild ecchymosis along the lateral aspect without deformity.  Full range of motion is preserved.  No significant tenderness on exam.  No open wounds.  Dorsalis pedis pulses sensation intact distally. Neurologic:  No gross focal neurologic deficits are appreciated.  Cranial nerves II through XII grossly intact at this time. Skin:   No rash noted Other:   ED Results / Procedures / Treatments   Labs (all labs ordered are listed, but only abnormal results are displayed) Labs Reviewed - No data to display   EKG     RADIOLOGY  I personally viewed, evaluated, and interpreted these images as part of my medical decision making, as well as reviewing the written report by the radiologist.  ED Provider Interpretation:  No acute traumatic finding on imaging.  Specifically no intracranial hemorrhage, skull fracture, cervical spine fracture, rib fracture or ankle fracture.  DG Ribs Unilateral W/Chest Right  Result Date: 08/12/2021 CLINICAL DATA:  Fall from [employer]. Chest wall pain. Right posterior rib pain. EXAM: RIGHT RIBS AND CHEST - 3+ VIEW COMPARISON:  None Available. FINDINGS: No fracture or other bone lesions are seen involving the ribs. There is no evidence of pneumothorax or pleural effusion. Both lungs are clear. Heart size and mediastinal contours are within normal limits. IMPRESSION: No right rib fracture or pulmonary complication. Electronically Signed   By: Keith Rake M.D.   On: 08/12/2021 22:25   DG Ankle Complete Right  Result Date:  08/12/2021 CLINICAL DATA:  Fall from [place], right ankle injury. EXAM: RIGHT ANKLE - COMPLETE 3+ VIEW COMPARISON:  None Available. FINDINGS: There is no evidence of fracture, dislocation, or joint effusion. Ankle mortise is preserved. There is no evidence of arthropathy or other focal bone abnormality. Soft tissues are unremarkable. IMPRESSION: Negative radiographs of the right ankle. Electronically Signed   By: Keith Rake M.D.   On: 08/12/2021 22:26   CT Head Wo Contrast  Result Date: 08/12/2021 CLINICAL DATA:  Poly trauma fall EXAM: CT HEAD WITHOUT CONTRAST CT CERVICAL SPINE WITHOUT CONTRAST TECHNIQUE: Multidetector CT imaging of the head and cervical spine was performed following the standard protocol without intravenous contrast. Multiplanar CT image reconstructions of the cervical spine were also generated. RADIATION DOSE REDUCTION: This exam was performed according to the departmental dose-optimization program which includes automated exposure control, adjustment of the mA and/or kV according to patient size and/or use of iterative reconstruction technique. COMPARISON:  None Available. FINDINGS: CT HEAD FINDINGS Brain: No acute territorial infarction, hemorrhage or intracranial mass. The ventricles are nonenlarged. Vascular: No hyperdense vessel or unexpected calcification. Skull: Normal. Negative for fracture or focal lesion. Sinuses/Orbits: No acute finding. Other: None CT CERVICAL SPINE FINDINGS Alignment: Straightening of the cervical spine. 2 mm anterolisthesis C3 on C4. Facet alignment within normal limits. Skull base and vertebrae: No acute fracture. No primary bone lesion or focal pathologic process. Soft tissues and spinal canal: No prevertebral fluid or swelling. No visible canal hematoma. Disc levels: Multilevel degenerative change. Moderate severe disc space narrowing C5-C6 and C6-C7. Facet degenerative changes at multiple levels with bilateral foraminal narrowing, worst at C3-C4. Upper  chest: Negative. Other: None IMPRESSION: 1. Negative non contrasted CT appearance of the brain for age 38. Straightening of the cervical spine with trace anterolisthesis C3 on C4 probably degenerative. No fracture is seen Electronically Signed   By: Donavan Foil M.D.   On: 08/12/2021 21:57   CT Cervical Spine Wo Contrast  Result Date: 08/12/2021 CLINICAL DATA:  Poly trauma fall EXAM: CT HEAD WITHOUT CONTRAST CT CERVICAL SPINE WITHOUT CONTRAST TECHNIQUE: Multidetector CT imaging of the head and cervical spine was performed following the standard protocol without intravenous contrast. Multiplanar CT image reconstructions of the cervical spine were also generated. RADIATION DOSE REDUCTION: This exam was performed according to the departmental dose-optimization program which includes automated exposure control, adjustment of the mA and/or kV according to patient size and/or use of iterative reconstruction technique. COMPARISON:  None Available. FINDINGS: CT HEAD FINDINGS Brain: No acute territorial infarction, hemorrhage or intracranial mass. The ventricles are nonenlarged. Vascular: No hyperdense vessel or unexpected calcification. Skull: Normal. Negative for fracture or focal lesion. Sinuses/Orbits: No acute finding. Other: None CT CERVICAL SPINE FINDINGS Alignment: Straightening of the cervical spine. 2 mm anterolisthesis C3 on  C4. Facet alignment within normal limits. Skull base and vertebrae: No acute fracture. No primary bone lesion or focal pathologic process. Soft tissues and spinal canal: No prevertebral fluid or swelling. No visible canal hematoma. Disc levels: Multilevel degenerative change. Moderate severe disc space narrowing C5-C6 and C6-C7. Facet degenerative changes at multiple levels with bilateral foraminal narrowing, worst at C3-C4. Upper chest: Negative. Other: None IMPRESSION: 1. Negative non contrasted CT appearance of the brain for age 72. Straightening of the cervical spine with trace  anterolisthesis C3 on C4 probably degenerative. No fracture is seen Electronically Signed   By: Donavan Foil M.D.   On: 08/12/2021 21:57    PROCEDURES:  Critical Care performed: No  Procedures   MEDICATIONS ORDERED IN ED: Medications - No data to display   IMPRESSION / MDM / [place] / ED COURSE  I reviewed the triage vital signs and the nursing notes.                              Differential diagnosis includes, but is not limited to, concussion, skull fracture, intracranial hemorrhage, scalp laceration, scalp contusion, rib fracture, pneumothorax, ankle fracture  Patient's presentation is most consistent with acute presentation with potential threat to life or bodily function.   Patient's diagnosis is consistent with fall with scalp hematoma, rib contusion and ankle contusion.  Patient presents to the ED after a mechanical fall.  She was sitting on a bleacher when she tried to get out of the way of an errant hit ball.  Patient did fall and hit her head.  Overall exam is reassuring with patient being neurologically intact.  Imaging reveals no acute traumatic findings.  Follow-up primary care as needed.  Return precautions discussed with the patient.  Patient is given ED precautions to return to the ED for any worsening or new symptoms.        FINAL CLINICAL IMPRESSION(S) / ED DIAGNOSES   Final diagnoses:  Fall, initial encounter  Hematoma of scalp, initial encounter  Contusion of rib on right side, initial encounter  Contusion of right ankle, initial encounter     Rx / DC Orders   ED Discharge Orders          Ordered    meloxicam (MOBIC) 7.5 MG tablet  Daily        08/12/21 2310    methocarbamol (ROBAXIN) 500 MG tablet  4 times daily        08/12/21 2310             Note:  This document was prepared using Dragon voice recognition software and may include unintentional dictation errors.   Brynda Peon 08/12/21 2319    Carrie Mew, MD 08/17/21 [phone]

## 2021-08-12 NOTE — ED Notes (Signed)
Urine specimen sent to lab '@[id number]'$ 

## 2021-08-24 DIAGNOSIS — R42 Dizziness and giddiness: Secondary | ICD-10-CM | POA: Diagnosis not present

## 2021-08-24 DIAGNOSIS — I10 Essential (primary) hypertension: Secondary | ICD-10-CM | POA: Diagnosis not present

## 2021-08-24 DIAGNOSIS — R55 Syncope and collapse: Secondary | ICD-10-CM | POA: Diagnosis not present

## 2021-08-24 DIAGNOSIS — I959 Hypotension, unspecified: Secondary | ICD-10-CM | POA: Diagnosis not present

## 2021-08-26 ENCOUNTER — Ambulatory Visit: Payer: Self-pay

## 2021-08-26 NOTE — Telephone Encounter (Signed)
Pt stated 2 weeks ago, she fell hurt back and bump head was seen at ER. [place] to [place] to the hospital.   Pt mentioned she has some concerns about the medication metoprolol succinate (TOPROL XL) 50 MG 24 hr tablet because it slows her heart rate down. Pt would like to discuss.   Per pt request, we scheduled her a hospital follow-up for next week, 09/02/2021, because she is still [place] the beach.   Pt seeking clinical advice.     Chief Complaint: Fainted on Sunday, [place] and given IV fluids per pt. Still out of town, has appointment next week with PCP. Symptoms: Above Frequency: Sunday Pertinent Negatives: Patient denies any symptoms today. Disposition: '[]'$ ED /'[]'$ Urgent Care (no appt availability in office) / '[]'$ Appointment(In office/virtual)/ '[]'$  [place] [facility]/ '[]'$ Home Care/ '[]'$ Refused Recommended Disposition /'[]'$ [place] [facility]/ '[x]'$  Follow-up with PCP Additional Notes: Instructed to return to ED for symptoms.  Answer Assessment - Initial Assessment Questions 1. ONSET: "How long were you unconscious?" (minutes) "When did it happen?"     Sunday 2. CONTENT: "What happened during period of unconsciousness?" (e.g., seizure activity)      Nothing 3. MENTAL STATUS: "Alert and oriented now?" (oriented x 3 = name, month, location)      Alert now 4. TRIGGER: "What do you think caused the fainting?" "What were you doing just before you fainted?"  (e.g., exercise, sudden standing up, prolonged standing)     Dehydration 5. RECURRENT SYMPTOM: "Have you ever passed out before?" If Yes, ask: "When was the last time?" and "What happened that time?"      No 6. INJURY: "Did you sustain any injury during the fall?"      No - seen in ED 7. CARDIAC SYMPTOMS: "Have you had any of the following symptoms: chest pain, difficulty breathing, palpitations?"     No 8. NEUROLOGIC SYMPTOMS: "Have you had any of the following symptoms:  headache, numbness, vertigo, weakness?"     No 9. GI SYMPTOMS: "Have you had any of the following symptoms: abdominal pain, vomiting, diarrhea, blood in stools?"     No 10. OTHER SYMPTOMS: "Do you have any other symptoms?"       No 11. PREGNANCY: "Is there any chance you are pregnant?" "When was your last menstrual period?"       No  Protocols used: Fainting-A-AH

## 2021-08-26 NOTE — Telephone Encounter (Signed)
Called patient. She does not want clinical advice at this time, but wishes to keep appt for next week.   - Heidi Powell

## 2021-09-02 ENCOUNTER — Ambulatory Visit (INDEPENDENT_AMBULATORY_CARE_PROVIDER_SITE_OTHER): Payer: Medicare HMO | Admitting: Internal Medicine

## 2021-09-02 ENCOUNTER — Encounter: Payer: Self-pay | Admitting: Internal Medicine

## 2021-09-02 VITALS — BP 126/76 | HR 83 | Ht 64.0 in | Wt 169.0 lb

## 2021-09-02 DIAGNOSIS — S20211A Contusion of right front wall of thorax, initial encounter: Secondary | ICD-10-CM

## 2021-09-02 DIAGNOSIS — R69 Illness, unspecified: Secondary | ICD-10-CM | POA: Diagnosis not present

## 2021-09-02 DIAGNOSIS — S9001XA Contusion of right ankle, initial encounter: Secondary | ICD-10-CM | POA: Diagnosis not present

## 2021-09-02 DIAGNOSIS — J0100 Acute maxillary sinusitis, unspecified: Secondary | ICD-10-CM | POA: Diagnosis not present

## 2021-09-02 DIAGNOSIS — F5101 Primary insomnia: Secondary | ICD-10-CM | POA: Diagnosis not present

## 2021-09-02 DIAGNOSIS — I10 Essential (primary) hypertension: Secondary | ICD-10-CM | POA: Diagnosis not present

## 2021-09-02 MED ORDER — AZITHROMYCIN 250 MG PO TABS: ORAL_TABLET | ORAL | 0 refills | Status: AC

## 2021-09-02 MED ORDER — DAYVIGO 5 MG PO TABS: 1.0000 | ORAL_TABLET | Freq: Every evening | ORAL | 0 refills | Status: DC | PRN

## 2021-09-02 MED ORDER — PROMETHAZINE-DM 6.25-15 MG/5ML PO SYRP: 5.0000 mL | ORAL_SOLUTION | Freq: Four times a day (QID) | ORAL | 0 refills | Status: AC | PRN

## 2022-01-20 DIAGNOSIS — Z859 Personal history of malignant neoplasm, unspecified: Secondary | ICD-10-CM | POA: Diagnosis not present

## 2022-01-20 DIAGNOSIS — L570 Actinic keratosis: Secondary | ICD-10-CM | POA: Diagnosis not present

## 2022-01-20 DIAGNOSIS — L821 Other seborrheic keratosis: Secondary | ICD-10-CM | POA: Diagnosis not present

## 2022-01-20 DIAGNOSIS — L578 Other skin changes due to chronic exposure to nonionizing radiation: Secondary | ICD-10-CM | POA: Diagnosis not present

## 2022-01-20 DIAGNOSIS — I781 Nevus, non-neoplastic: Secondary | ICD-10-CM | POA: Diagnosis not present

## 2022-01-20 DIAGNOSIS — D225 Melanocytic nevi of trunk: Secondary | ICD-10-CM | POA: Diagnosis not present

## 2022-01-28 ENCOUNTER — Other Ambulatory Visit: Payer: Self-pay | Admitting: Internal Medicine

## 2022-01-28 DIAGNOSIS — E782 Mixed hyperlipidemia: Secondary | ICD-10-CM

## 2022-01-28 NOTE — Telephone Encounter (Signed)
Requested Prescriptions  Pending Prescriptions Disp Refills   atorvastatin (LIPITOR) 10 MG tablet [Pharmacy Med Name: ATORVASTATIN 10 MG TABLET] 90 tablet 0    Sig: TAKE 1 TABLET BY MOUTH EVERY DAY     Cardiovascular:  Antilipid - Statins Failed - 01/28/2022  2:00 AM      Failed - Lipid Panel in normal range within the last 12 months    Cholesterol, Total  Date Value Ref Range Status  02/10/2021 193 100 - 199 mg/dL Final   LDL Chol Calc (NIH)  Date Value Ref Range Status  02/10/2021 115 (H) 0 - 99 mg/dL Final   HDL  Date Value Ref Range Status  02/10/2021 50 >39 mg/dL Final   Triglycerides  Date Value Ref Range Status  02/10/2021 158 (H) 0 - 149 mg/dL Final         Passed - Patient is not pregnant      Passed - Valid encounter within last 12 months    Recent Outpatient Visits           4 months ago Acute non-recurrent maxillary sinusitis   [place] [facility] at [facility], Jesse Sans, MD   7 months ago Acute pain of left knee   [place] [facility] at [facility], Jesse Sans, MD   9 months ago Benign essential HTN   [place] [facility] at [facility], Jesse Sans, MD   11 months ago Annual physical exam   [facility] at [facility], Jesse Sans, MD   1 year ago Encounter for removal of sutures   [place] [facility] at [facility], Jesse Sans, MD       Future Appointments             In 2 weeks Army Melia, Jesse Sans, MD [place] [facility] at [facility], [facility]

## 2022-02-03 ENCOUNTER — Other Ambulatory Visit: Payer: Self-pay | Admitting: Internal Medicine

## 2022-02-03 DIAGNOSIS — I10 Essential (primary) hypertension: Secondary | ICD-10-CM

## 2022-02-03 NOTE — Telephone Encounter (Signed)
Requested Prescriptions  Pending Prescriptions Disp Refills   losartan (COZAAR) 50 MG tablet [Pharmacy Med Name: LOSARTAN POTASSIUM 50 MG TAB] 90 tablet 0    Sig: TAKE 1 TABLET BY MOUTH EVERY DAY     Cardiovascular:  Angiotensin Receptor Blockers Failed - 02/03/2022  2:04 AM      Failed - Cr in normal range and within 180 days    Creatinine  Date Value Ref Range Status  06/23/2014 0.93 mg/dL Final    Comment:    0.44-1.00 NOTE: New Reference Range  05/15/14    Creatinine, Ser  Date Value Ref Range Status  02/10/2021 1.00 0.57 - 1.00 mg/dL Final         Failed - K in normal range and within 180 days    Potassium  Date Value Ref Range Status  02/10/2021 4.5 3.5 - 5.2 mmol/L Final  06/23/2014 3.7 mmol/L Final    Comment:    3.5-5.1 NOTE: New Reference Range  05/15/14          Passed - Patient is not pregnant      Passed - Last BP in normal range    BP Readings from Last 1 Encounters:  09/02/21 126/76         Passed - Valid encounter within last 6 months    Recent Outpatient Visits           5 months ago Acute non-recurrent maxillary sinusitis   [place] [facility] at [facility], Jesse Sans, MD   8 months ago Acute pain of left knee   [place] [facility] at [facility], Jesse Sans, MD   9 months ago Benign essential HTN   [place] [facility] at [facility], Jesse Sans, MD   11 months ago Annual physical exam   [facility] at [facility], Jesse Sans, MD   1 year ago Encounter for removal of sutures    [facility] and Sports Medicine at [facility], Jesse Sans, MD       Future Appointments             In 1 week Army Melia, Jesse Sans, MD [place] [facility] at [facility], [facility]

## 2022-02-04 ENCOUNTER — Ambulatory Visit (INDEPENDENT_AMBULATORY_CARE_PROVIDER_SITE_OTHER): Payer: Medicare HMO

## 2022-02-04 VITALS — Ht 64.0 in | Wt 169.0 lb

## 2022-02-04 DIAGNOSIS — Z0000 Encounter for general adult medical examination without abnormal findings: Secondary | ICD-10-CM | POA: Diagnosis not present

## 2022-02-04 NOTE — Progress Notes (Signed)
Subjective:   Heidi Powell is a 71 y.o. female who presents for Medicare Annual (Subsequent) preventive examination.  I connected with  Heidi Powell on 02/04/22 by a audio enabled telemedicine application and verified that I am speaking with the correct person using two identifiers.  Patient Location: Home  Provider Location: Office/Clinic  I discussed the limitations of evaluation and management by telemedicine. The patient expressed understanding and agreed to proceed.    Review of Systems    Defer to PCP Cardiac Risk Factors include: advanced age (>29mn, >>39women)     Objective:    Today's Vitals   02/04/22 0906  Weight: 169 lb (76.7 kg)  Height: '5\' 4"'$  (1.626 m)   Body mass index is 29.01 kg/m.     02/04/2022    9:19 AM 02/03/2021    9:32 AM 11/20/2020    9:47 AM 11/08/2020    1:55 PM 10/25/2020   12:23 PM 01/29/2020    9:34 AM 01/25/2019    9:33 AM  Advanced Directives  Does Patient Have a Medical Advance Directive? Yes Yes Yes Yes No Yes Yes  Type of AParamedicof AMirandaLiving will Living will Living will  HLindLiving will HOkarcheLiving will  Does patient want to make changes to medical advance directive?   No - Guardian declined No - Patient declined     Copy of [place] Chart? Yes - validated most recent copy scanned in chart (See row information) Yes - validated most recent copy scanned in chart (See row information)    No - copy requested No - copy requested  Would patient like information on creating a medical advance directive?   No - Patient declined No - Patient declined No - Patient declined      Current Medications (verified) Outpatient Encounter Medications as of 02/04/2022  Medication Sig   Acetaminophen (TYLENOL ARTHRITIS PAIN PO) Take by mouth as needed.   atorvastatin (LIPITOR) 10 MG tablet TAKE 1 TABLET  BY MOUTH EVERY DAY   diclofenac (VOLTAREN) 75 MG EC tablet Take 1 tablet (75 mg total) by mouth 2 (two) times daily.   IBUPROFEN PO Take by mouth as needed.   Lemborexant (DAYVIGO) 5 MG TABS Take 1 tablet by mouth at bedtime as needed.   losartan (COZAAR) 50 MG tablet TAKE 1 TABLET BY MOUTH EVERY DAY   meloxicam (MOBIC) 7.5 MG tablet Take 1 tablet (7.5 mg total) by mouth daily.   methocarbamol (ROBAXIN) 500 MG tablet Take 1 tablet (500 mg total) by mouth 4 (four) times daily.   metoprolol succinate (TOPROL XL) 50 MG 24 hr tablet Take 1 tablet (50 mg total) by mouth daily. Take with or immediately following a meal.   Multiple Vitamin (MULTIVITAMIN) tablet Take 1 tablet by mouth daily.   No facility-administered encounter medications on file as of 02/04/2022.    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   BREAST BIOPSY Left 11/20/2020   Procedure: BREAST BIOPSY WITH NEEDLE LOCALIZATION;  Surgeon: BRobert Bellow MD;  Location: [facility] ORS;  Service: General;  Laterality: Left;   BREAST EXCISIONAL BIOPSY Left 11/20/2020   excision-COMPLEX SCLEROSING LESION.   COLONOSCOPY  08/21/2008   normal   FRACTURE SURGERY Right    arm no hardware   TUBAL LIGATION     Family History  Problem Relation Age of Onset   Hypertension Mother    Renal cancer Mother    Stroke Mother    Heart disease Father    Hypertension Father    Stroke Brother    Breast cancer Neg Hx    Social History   Socioeconomic History   Marital status: Divorced    Spouse name: Not on file   Number of children: 1   Years of education: Not on file   Highest education level: Not on  file  Occupational History   Not on file  Tobacco Use   Smoking status: Never   Smokeless tobacco: Never   Tobacco comments:    smoking cessation materials not required  Vaping Use   Vaping Use: Never used  Substance and Sexual Activity   Alcohol use: Yes    Alcohol/week: 2.0 standard drinks of alcohol    Types: 2 Glasses of wine per week   Drug use: Never   Sexual activity: Not Currently    Partners: Male    Birth control/protection: None  Other Topics Concern   Not on file  Social History Narrative   Pt lives alone at home .   Social Determinants of Health   Financial Resource Strain: Low Risk  (02/04/2022)   Overall Financial Resource Strain (CARDIA)    Difficulty of Paying Living Expenses: Not hard at all  Food Insecurity: No Food Insecurity (02/04/2022)   Hunger Vital Sign    Worried About Running Out of Food in the Last Year: Never true    Ran Out of Food in the Last Year: Never true  Transportation Needs: No Transportation Needs (02/04/2022)   PRAPARE - Hydrologist (Medical): No    Lack of Transportation (Non-Medical): No  Physical Activity: Sufficiently Active (02/04/2022)   Exercise Vital Sign    Days of Exercise per Week: 7 days    Minutes of Exercise per Session: 30 min  Stress: No Stress Concern Present (02/04/2022)   [place]    Feeling of Stress : Not at all  Social Connections: Moderately Isolated (02/04/2022)   Social Connection and Isolation Panel [NHANES]    Frequency of Communication with Friends and Family: More than three times a week    Frequency of Social Gatherings with Friends and Family: More than three times a week    Attends Religious Services: More than 4 times per year    Active Member of [employer] or Organizations: No    Attends Archivist Meetings: Never    Marital Status: Divorced    Tobacco Counseling Counseling given: Not  Answered Tobacco comments: smoking cessation materials not required   Clinical Intake:  Pre-visit preparation completed: Yes  Pain : No/denies pain     BMI - recorded: 29.01 Nutritional Status: BMI 25 -29 Overweight Nutritional Risks: None Diabetes: No     Diabetic? No.  Interpreter Needed?: No  Information entered by :: Wyatt Haste, [place]   Activities of Daily Living    02/04/2022    9:21 AM 06/05/2021   11:09 AM  In your present state of health, do you have any difficulty performing the  following activities:  Hearing? 0 0  Vision? 0 0  Difficulty concentrating or making decisions? 0 0  Walking or climbing stairs? 0 1  Dressing or bathing? 0 0  Doing errands, shopping? 0 0  Preparing Food and eating ? N   Using the Toilet? N   In the past six months, have you accidently leaked urine? N   Do you have problems with loss of bowel control? N   Managing your Medications? N   Managing your Finances? N   Housekeeping or managing your Housekeeping? N     Patient Care Team: Glean Hess, MD as PCP - General (Internal Medicine) Corey Skains, MD as Consulting Physician (Cardiology) Ree Edman, MD (Dermatology) Bary Castilla Forest Gleason, MD as Consulting Physician (General Surgery) Baker Pierini, OD (Optometry)  Indicate any recent Medical Services you may have received from other than [facility] providers in the past year (date may be approximate).     Assessment:   This is a routine wellness examination for [place].  Hearing/Vision screen Hearing Screening - Comments:: No concerns. Vision Screening - Comments:: Wears glasses to read and drive.  Dietary issues and exercise activities discussed: Current Exercise Habits: Home exercise routine, Type of exercise: walking, Time (Minutes): > 60, Frequency (Times/Week): 7, Weekly Exercise (Minutes/Week): 0, Intensity: Moderate   Goals Addressed   None   Depression Screen    02/04/2022    9:20 AM  09/02/2021    1:28 PM 06/05/2021   11:08 AM 04/14/2021    8:57 AM 02/10/2021    8:31 AM 02/03/2021    9:32 AM 11/04/2020    4:01 PM  PHQ 2/9 Scores  PHQ - 2 Score 0 2 0 0 0 0 0  PHQ- 9 Score '4 5 3 1 1  2    '$ Fall Risk    02/04/2022    9:20 AM 09/02/2021    1:28 PM 06/05/2021   11:09 AM 04/14/2021    8:57 AM 02/10/2021    8:31 AM  Fall Risk   Falls in the past year? '1 1 1 1 1  '$ Number falls in past yr: 1 1 0 1 1  Injury with Fall? 0 1 1 0 1  Risk for fall due to : History of fall(s) History of fall(s) History of fall(s) No Fall Risks History of fall(s)  Follow up Falls evaluation completed Falls evaluation completed Falls evaluation completed Falls evaluation completed Falls evaluation completed    [place]:  Any stairs in or around the home? No  If so, are there any without handrails?  N/A Home free of loose throw rugs in walkways, pet beds, electrical cords, etc? Yes  Adequate lighting in your home to reduce risk of falls? Yes   ASSISTIVE DEVICES UTILIZED TO PREVENT FALLS:  Life alert? No  Use of a cane, walker or w/c? No  Grab bars in the bathroom? No  Shower chair or bench in shower? No  Elevated toilet seat or a handicapped toilet? No   Cognitive Function:        02/04/2022    9:21 AM  6CIT Screen  What Year? 0 points  What month? 0 points  What time? 0 points  Count back from 20 0 points  Months in reverse 0 points  Repeat phrase 0 points  Total Score 0 points    Immunizations Immunization History  Administered Date(s) Administered   Fluad Quad(high Dose 65+) 01/10/2019, 02/05/2020, 02/10/2021   Influenza,inj,Quad  PF,6+ Mos 01/21/2016, 01/22/2017, 01/25/2018   Influenza-Unspecified 01/21/2016   PFIZER(Purple Top)SARS-COV-2 Vaccination 06/20/2019, 07/11/2019   Pneumococcal Conjugate-13 01/30/2019   Pneumococcal Polysaccharide-23 02/05/2020   Tdap 10/25/2020   Zoster Recombinat (Shingrix) 10/07/2020, 01/28/2021    TDAP  status: Up to date  Flu Vaccine status: Due, Education has been provided regarding the importance of this vaccine. Advised may receive this vaccine at local pharmacy or Health Dept. Aware to provide a copy of the vaccination record if obtained from local pharmacy or Health Dept. Verbalized acceptance and understanding.  Pneumococcal vaccine status: Up to date  Covid-19 vaccine status: Completed vaccines  Qualifies for Shingles Vaccine? Yes   Zostavax completed Yes   Shingrix Completed?: Yes  Screening Tests Health Maintenance  Topic Date Due   COLONOSCOPY (Pts 45-36yr Insurance coverage will need to be confirmed)  08/22/2018   INFLUENZA VACCINE  10/07/2021   COVID-19 Vaccine (3 - 2023-24 season) 02/20/2022 (Originally 11/07/2021)   COLON CANCER SCREENING ANNUAL FOBT  02/21/2022   MAMMOGRAM  03/17/2022   Medicare Annual Wellness (AWV)  02/05/2023   Pneumonia Vaccine 69 Years old  Completed   DEXA SCAN  Completed   Hepatitis C Screening  Completed   Zoster Vaccines- Shingrix  Completed   HPV VACCINES  Aged Out    Health Maintenance  Health Maintenance Due  Topic Date Due   COLONOSCOPY (Pts 45-48yrInsurance coverage will need to be confirmed)  08/22/2018   INFLUENZA VACCINE  10/07/2021    Colorectal cancer screening: Type of screening: FOBT/FIT. Completed 02/21/2021. Repeat every 1 years  Mammogram status: Completed 03/17/2021. Repeat every year  Bone Density status: Completed 03/06/2019. Results reflect: Bone density results: NORMAL. Repeat every 3 years.  Lung Cancer Screening: (Low Dose CT Chest recommended if Age 69-80ears, 30 pack-year currently smoking OR have quit w/in 15years.) does not qualify.   Additional Screening:  Hepatitis C Screening: does qualify; Completed 01/21/2016  Vision Screening: Recommended annual ophthalmology exams for early detection of glaucoma and other disorders of the eye. Is the patient up to date with their annual eye exam?  Yes   Who is the provider or what is the name of the office in which the patient attends annual eye exams? [facility] pt is not established with a provider, would they like to be referred to a provider to establish care? No .   Dental Screening: Recommended annual dental exams for proper oral hygiene  Community Resource Referral / Chronic Care Management: CRR required this visit?  No   CCM required this visit?  No      Plan:     I have personally reviewed and noted the following in the patient's chart:   Medical and social history Use of alcohol, tobacco or illicit drugs  Current medications and supplements including opioid prescriptions. Patient is not currently taking opioid prescriptions. Functional ability and status Nutritional status Physical activity Advanced directives List of other physicians Hospitalizations, surgeries, and ER visits in previous 12 months Vitals Screenings to include cognitive, depression, and falls Referrals and appointments  In addition, I have reviewed and discussed with patient certain preventive protocols, quality metrics, and best practice recommendations. A written personalized care plan for preventive services as well as general preventive health recommendations were provided to patient.     ChClista BernhardtCMLangley 02/04/2022     Ms. Blanks , Thank you for taking time to come for your Medicare Wellness Visit. I appreciate your  ongoing commitment to your health goals. Please review the following plan we discussed and let me know if I can assist you in the future.   These are the goals we discussed:  Goals      Patient Stated     Pt states she would like to overall stay healthy and active.         This is a list of the screening recommended for you and due dates:  Health Maintenance  Topic Date Due   Colon Cancer Screening  08/22/2018   Flu Shot  10/07/2021   COVID-19 Vaccine (3 - 2023-24 season) 02/20/2022*    Stool Blood Test  02/21/2022   Mammogram  03/17/2022   Medicare Annual Wellness Visit  02/05/2023   Pneumonia Vaccine  Completed   DEXA scan (bone density measurement)  Completed   Hepatitis C Screening: USPSTF Recommendation to screen - Ages 8-79 yo.  Completed   Zoster (Shingles) Vaccine  Completed   HPV Vaccine  Aged Out  *Topic was postponed. The date shown is not the original due date.     Nurse Notes: None.

## 2022-02-12 ENCOUNTER — Encounter: Payer: Self-pay | Admitting: Internal Medicine

## 2022-02-12 ENCOUNTER — Ambulatory Visit (INDEPENDENT_AMBULATORY_CARE_PROVIDER_SITE_OTHER): Payer: Medicare HMO | Admitting: Internal Medicine

## 2022-02-12 VITALS — BP 128/78 | HR 71 | Ht 64.0 in | Wt 169.0 lb

## 2022-02-12 DIAGNOSIS — Z1211 Encounter for screening for malignant neoplasm of colon: Secondary | ICD-10-CM | POA: Diagnosis not present

## 2022-02-12 DIAGNOSIS — R7303 Prediabetes: Secondary | ICD-10-CM | POA: Diagnosis not present

## 2022-02-12 DIAGNOSIS — Z0000 Encounter for general adult medical examination without abnormal findings: Secondary | ICD-10-CM

## 2022-02-12 DIAGNOSIS — Z23 Encounter for immunization: Secondary | ICD-10-CM | POA: Diagnosis not present

## 2022-02-12 DIAGNOSIS — Z1231 Encounter for screening mammogram for malignant neoplasm of breast: Secondary | ICD-10-CM

## 2022-02-12 DIAGNOSIS — I10 Essential (primary) hypertension: Secondary | ICD-10-CM

## 2022-02-12 DIAGNOSIS — E782 Mixed hyperlipidemia: Secondary | ICD-10-CM

## 2022-02-12 LAB — POCT URINALYSIS DIPSTICK
Bilirubin, UA: NEGATIVE
Glucose, UA: NEGATIVE
Ketones, UA: NEGATIVE
Leukocytes, UA: NEGATIVE
Nitrite, UA: NEGATIVE
Protein, UA: NEGATIVE
Spec Grav, UA: 1.025 (ref 1.010–1.025)
Urobilinogen, UA: 0.2 E.U./dL
pH, UA: 6 (ref 5.0–8.0)

## 2022-02-12 NOTE — Progress Notes (Signed)
Date:  02/12/2022   Name:  Heidi Powell   DOB:  1950-04-23   MRN:  [id number]   Chief Complaint: Annual Exam Heidi Powell is a 71 y.o. female who presents today for her Complete Annual Exam. She feels well. She reports exercising/ walking. She reports she is sleeping fairly well. Breast complaints - none.  Mammogram: 03/2021 DEXA: 02/2019 osteopenia hip Pap smear: discontinued Colonoscopy: FIT 02/2021  Health Maintenance Due  Topic Date Due   COLONOSCOPY (Pts 45-50yr Insurance coverage will need to be confirmed)  08/22/2018   INFLUENZA VACCINE  10/07/2021    Immunization History  Administered Date(s) Administered   Fluad Quad(high Dose 65+) 01/10/2019, 02/05/2020, 02/10/2021   Influenza,inj,Quad PF,6+ Mos 01/21/2016, 01/22/2017, 01/25/2018   Influenza-Unspecified 01/21/2016   PFIZER(Purple Top)SARS-COV-2 Vaccination 06/20/2019, 07/11/2019   Pneumococcal Conjugate-13 01/30/2019   Pneumococcal Polysaccharide-23 02/05/2020   Tdap 10/25/2020   Zoster Recombinat (Shingrix) 10/07/2020, 01/28/2021    Hypertension This is a chronic problem. The problem is controlled. Pertinent negatives include no chest pain, headaches, palpitations or shortness of breath. Past treatments include beta blockers and angiotensin blockers. The current treatment provides significant improvement. There is no history of kidney disease, CAD/MI or CVA.  Hyperlipidemia This is a chronic problem. The problem is controlled. Pertinent negatives include no chest pain or shortness of breath. Current antihyperlipidemic treatment includes statins. The current treatment provides significant improvement of lipids.    Lab Results  Component Value Date   NA 142 02/10/2021   K 4.5 02/10/2021   CO2 22 02/10/2021   GLUCOSE 99 02/10/2021   BUN 15 02/10/2021   CREATININE 1.00 02/10/2021   CALCIUM 9.7 02/10/2021   EGFR 61 02/10/2021   GFRNONAA 47 (L) 10/25/2020   Lab Results  Component Value Date    CHOL 193 02/10/2021   HDL 50 02/10/2021   LDLCALC 115 (H) 02/10/2021   TRIG 158 (H) 02/10/2021   CHOLHDL 3.9 02/10/2021   Lab Results  Component Value Date   TSH 0.538 02/10/2021   Lab Results  Component Value Date   HGBA1C 5.7 (H) 02/10/2021   Lab Results  Component Value Date   WBC 9.7 02/10/2021   HGB 14.2 02/10/2021   HCT 40.8 02/10/2021   MCV 91 02/10/2021   PLT 278 02/10/2021   Lab Results  Component Value Date   ALT 19 02/10/2021   AST 22 02/10/2021   ALKPHOS 185 (H) 02/10/2021   BILITOT 0.4 02/10/2021   Lab Results  Component Value Date   VD25OH 39.9 11/06/2014     Review of Systems  Constitutional:  Negative for chills, fatigue and fever.  HENT:  Negative for congestion, hearing loss, tinnitus, trouble swallowing and voice change.   Eyes:  Negative for visual disturbance.  Respiratory:  Negative for cough, chest tightness, shortness of breath and wheezing.   Cardiovascular:  Negative for chest pain, palpitations and leg swelling.  Gastrointestinal:  Negative for abdominal pain, constipation, diarrhea and vomiting.  Endocrine: Negative for polydipsia and polyuria.  Genitourinary:  Negative for dysuria, frequency, genital sores, vaginal bleeding and vaginal discharge.  Musculoskeletal:  Negative for arthralgias, gait problem and joint swelling.  Skin:  Negative for color change and rash.  Neurological:  Negative for dizziness, tremors, light-headedness and headaches.  Hematological:  Negative for adenopathy. Does not bruise/bleed easily.  Psychiatric/Behavioral:  Negative for dysphoric mood and sleep disturbance. The patient is not nervous/anxious.     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   BREAST BIOPSY Left 11/20/2020   Procedure: BREAST BIOPSY WITH NEEDLE LOCALIZATION;  Surgeon: Robert Bellow, MD;  Location: [facility] ORS;  Service: General;  Laterality: Left;   BREAST EXCISIONAL BIOPSY Left 11/20/2020   excision-COMPLEX SCLEROSING LESION.   COLONOSCOPY  08/21/2008   normal   FRACTURE SURGERY Right    arm no hardware   TUBAL LIGATION      Social History   Tobacco Use   Smoking status: Never   Smokeless tobacco: Never   Tobacco comments:    smoking cessation materials not required  Vaping Use   Vaping Use: Never used  Substance Use Topics   Alcohol use: Yes    Alcohol/week: 2.0 standard drinks of alcohol    Types: 2 Glasses of wine per week   Drug use: Never     Medication list has been reviewed and updated.  Current Meds  Medication Sig   Acetaminophen (TYLENOL ARTHRITIS PAIN PO) Take by mouth as needed.   atorvastatin (LIPITOR) 10 MG tablet TAKE 1 TABLET BY MOUTH EVERY DAY   diclofenac (VOLTAREN) 75 MG EC tablet Take 1 tablet (75 mg total) by mouth 2 (two) times daily.   IBUPROFEN PO Take by mouth as needed.   losartan (COZAAR) 50 MG tablet TAKE 1 TABLET BY MOUTH EVERY DAY   methocarbamol (ROBAXIN) 500 MG tablet Take 1 tablet (500 mg total) by mouth 4 (four) times daily.   Multiple Vitamin (MULTIVITAMIN) tablet Take 1 tablet by mouth daily.       02/12/2022    8:27 AM 09/02/2021    1:28 PM 06/05/2021   11:09 AM 04/14/2021    8:57 AM  GAD 7 : Generalized Anxiety Score  Nervous, Anxious, on Edge 0 0 0 0  Control/stop worrying 0 0 0 0  Worry too much - different things 0 0 0 0  Trouble relaxing 0 0 0 0  Restless 0 0 0 0  Easily annoyed or irritable 0 0 0 0  Afraid - awful might happen 0 0 0 0  Total GAD 7 Score 0 0 0 0  Anxiety Difficulty Not difficult at all Not  difficult at all  Not difficult at all       02/12/2022    8:27 AM 02/04/2022    9:20 AM 09/02/2021    1:28 PM  Depression screen PHQ 2/9  Decreased Interest 0 0 1  Down, Depressed, Hopeless 0 0 1  PHQ - 2 Score 0 0 2  Altered sleeping _0 Tired, decreased energy _1 Change in appetite 0 0 0  Feeling bad or failure about yourself  0 0 0  Trouble concentrating 0 0 0  Moving slowly or fidgety/restless 0 0 0  Suicidal thoughts 0 0 0  PHQ-9 Score _2 Difficult doing work/chores Not difficult at all Not difficult at all Not difficult at all    BP Readings from Last 3 Encounters:  02/12/22 128/78  09/02/21 126/76  08/12/21 128/78    Physical Exam Vitals  and nursing note reviewed.  Constitutional:      General: She is not in acute distress.    Appearance: She is well-developed.  HENT:     Head: Normocephalic and atraumatic.     Right Ear: Tympanic membrane and ear canal normal.     Left Ear: Tympanic membrane and ear canal normal.     Nose:     Right Sinus: No maxillary sinus tenderness.     Left Sinus: No maxillary sinus tenderness.  Eyes:     General: No scleral icterus.       Right eye: No discharge.        Left eye: No discharge.     Conjunctiva/sclera: Conjunctivae normal.  Neck:     Thyroid: No thyromegaly.     Vascular: No carotid bruit.  Cardiovascular:     Rate and Rhythm: Normal rate and regular rhythm.     Pulses: Normal pulses.     Heart sounds: Normal heart sounds.  Pulmonary:     Effort: Pulmonary effort is normal. No respiratory distress.     Breath sounds: No wheezing.  Chest:  Breasts:    Right: No mass, nipple discharge, skin change or tenderness.     Left: No mass, nipple discharge, skin change or tenderness.  Abdominal:     General: Bowel sounds are normal.     Palpations: Abdomen is soft.     Tenderness: There is no abdominal tenderness.  Musculoskeletal:     Cervical back: Normal range of motion. No erythema.     Right lower leg:  No edema.     Left lower leg: No edema.  Lymphadenopathy:     Cervical: No cervical adenopathy.  Skin:    General: Skin is warm and dry.     Capillary Refill: Capillary refill takes less than 2 seconds.     Findings: No rash.  Neurological:     General: No focal deficit present.     Mental Status: She is alert and oriented to person, place, and time.     Cranial Nerves: No cranial nerve deficit.     Sensory: No sensory deficit.     Deep Tendon Reflexes: Reflexes are normal and symmetric.  Psychiatric:        Attention and Perception: Attention normal.        Mood and Affect: Mood normal.     Wt Readings from Last 3 Encounters:  02/12/22 169 lb (76.7 kg)  02/04/22 169 lb (76.7 kg)  09/02/21 169 lb (76.7 kg)    BP 128/78   Pulse 71   Ht _0  (1.626 m)   Wt 169 lb (76.7 kg)   SpO2 97%   BMI 29.01 kg/m   Assessment and Plan: 1. Annual physical exam Normal exam Continue healthy diet and exercise. Up to date on screenings and immunizations.  2. Encounter for screening mammogram for breast cancer Schedule at [facility] - MM 3D SCREEN BREAST BILATERAL  3. Colon cancer screening - Fecal occult blood, imunochemical  4. Benign essential HTN Clinically stable exam with well controlled BP. Tolerating medications without side effects at this time. Pt to continue current regimen and low sodium diet; benefits of regular exercise as able discussed. - CBC with Differential/Platelet - TSH - POCT urinalysis dipstick  5. Mixed hyperlipidemia Tolerating statin medication without side effects at this time LDL is at goal of < 70 on current dose Continue same therapy without change at this time. - Comprehensive metabolic panel - Lipid  panel  6. Prediabetes Continue diet and exercise - Hemoglobin A1c   Partially dictated using Editor, commissioning. Any errors are unintentional.  Halina Maidens, MD [facility]  02/12/2022

## 2022-02-12 NOTE — Patient Instructions (Signed)
Look for Osteo bioflex with Boswellia - joint supplement

## 2022-02-13 LAB — LIPID PANEL
Chol/HDL Ratio: 3.5 ratio (ref 0.0–4.4)
Cholesterol, Total: 205 mg/dL — ABNORMAL HIGH (ref 100–199)
HDL: 58 mg/dL (ref >39)
LDL Chol Calc (NIH): 116 mg/dL — ABNORMAL HIGH (ref 0–99)
Triglycerides: 180 mg/dL — ABNORMAL HIGH (ref 0–149)
VLDL Cholesterol Cal: 31 mg/dL (ref 5–40)

## 2022-02-13 LAB — HEMOGLOBIN A1C
Est. average glucose Bld gHb Est-mCnc: 114 mg/dL
Hgb A1c MFr Bld: 5.6 % (ref 4.8–5.6)

## 2022-02-13 LAB — CBC WITH DIFFERENTIAL/PLATELET
Basophils Absolute: 0.1 10*3/uL (ref 0.0–0.2)
Basos: 1 %
EOS (ABSOLUTE): 0.1 10*3/uL (ref 0.0–0.4)
Eos: 2 %
Hematocrit: 43.1 % (ref 34.0–46.6)
Hemoglobin: 14.7 g/dL (ref 11.1–15.9)
Immature Grans (Abs): 0 10*3/uL (ref 0.0–0.1)
Immature Granulocytes: 1 %
Lymphocytes Absolute: 1.9 10*3/uL (ref 0.7–3.1)
Lymphs: 30 %
MCH: 30.9 pg (ref 26.6–33.0)
MCHC: 34.1 g/dL (ref 31.5–35.7)
MCV: 91 fL (ref 79–97)
Monocytes Absolute: 0.4 10*3/uL (ref 0.1–0.9)
Monocytes: 6 %
Neutrophils Absolute: 3.9 10*3/uL (ref 1.4–7.0)
Neutrophils: 60 %
Platelets: 240 10*3/uL (ref 150–450)
RBC: 4.75 x10E6/uL (ref 3.77–5.28)
RDW: 12.4 % (ref 11.7–15.4)
WBC: 6.4 10*3/uL (ref 3.4–10.8)

## 2022-02-13 LAB — COMPREHENSIVE METABOLIC PANEL
ALT: 21 IU/L (ref 0–32)
AST: 27 IU/L (ref 0–40)
Albumin/Globulin Ratio: 1.8 (ref 1.2–2.2)
Albumin: 4.6 g/dL (ref 3.9–4.9)
Alkaline Phosphatase: 135 IU/L — ABNORMAL HIGH (ref 44–121)
BUN/Creatinine Ratio: 15 (ref 12–28)
BUN: 15 mg/dL (ref 8–27)
Bilirubin Total: 0.4 mg/dL (ref 0.0–1.2)
CO2: 20 mmol/L (ref 20–29)
Calcium: 9.5 mg/dL (ref 8.7–10.3)
Chloride: 104 mmol/L (ref 96–106)
Creatinine, Ser: 1.03 mg/dL — ABNORMAL HIGH (ref 0.57–1.00)
Globulin, Total: 2.6 g/dL (ref 1.5–4.5)
Glucose: 103 mg/dL — ABNORMAL HIGH (ref 70–99)
Potassium: 4.5 mmol/L (ref 3.5–5.2)
Sodium: 139 mmol/L (ref 134–144)
Total Protein: 7.2 g/dL (ref 6.0–8.5)
eGFR: 59 mL/min/{1.73_m2} — ABNORMAL LOW (ref >59)

## 2022-02-13 LAB — TSH: TSH: 0.618 u[IU]/mL (ref 0.450–4.500)

## 2022-02-17 ENCOUNTER — Encounter: Payer: Self-pay | Admitting: Internal Medicine

## 2022-02-18 ENCOUNTER — Other Ambulatory Visit: Payer: Self-pay | Admitting: Internal Medicine

## 2022-02-18 DIAGNOSIS — M25562 Pain in left knee: Secondary | ICD-10-CM

## 2022-02-18 MED ORDER — DICLOFENAC SODIUM 75 MG PO TBEC: 75.0000 mg | DELAYED_RELEASE_TABLET | Freq: Two times a day (BID) | ORAL | 0 refills | Status: DC

## 2022-03-18 ENCOUNTER — Telehealth: Payer: Self-pay | Admitting: Internal Medicine

## 2022-03-18 ENCOUNTER — Other Ambulatory Visit: Payer: Self-pay

## 2022-03-18 ENCOUNTER — Ambulatory Visit
Admission: RE | Admit: 2022-03-18 | Discharge: 2022-03-18 | Disposition: A | Discharge status: Home / Self Care | Payer: Medicare HMO | Source: Ambulatory Visit | Attending: Internal Medicine | Admitting: Internal Medicine

## 2022-03-18 DIAGNOSIS — Z1211 Encounter for screening for malignant neoplasm of colon: Secondary | ICD-10-CM

## 2022-03-18 DIAGNOSIS — Z1231 Encounter for screening mammogram for malignant neoplasm of breast: Secondary | ICD-10-CM | POA: Insufficient documentation

## 2022-03-18 LAB — FECAL OCCULT BLOOD, IMMUNOCHEMICAL

## 2022-03-18 NOTE — Telephone Encounter (Signed)
Called pt she is aware and will come pick up a new kit. Kit at front desk.  KP

## 2022-03-18 NOTE — Telephone Encounter (Signed)
Copied from [facility] [id number]. Topic: General - Other >> Mar 18, 2022  1:29 PM Oley Balm E wrote: Reason for CRM: Received occult blood sample that was crushed, it is defective. They need new orders and a new collection. She will not be charged since they are unable to test.  [facility] from [place] contact: [phone] extension [phone]  Reference: [id number]

## 2022-03-19 ENCOUNTER — Other Ambulatory Visit: Payer: Self-pay | Admitting: Internal Medicine

## 2022-03-19 DIAGNOSIS — M25562 Pain in left knee: Secondary | ICD-10-CM

## 2022-03-19 NOTE — Telephone Encounter (Signed)
Requested Prescriptions  Pending Prescriptions Disp Refills   diclofenac (VOLTAREN) 75 MG EC tablet [Pharmacy Med Name: DICLOFENAC SOD EC 75 MG TAB] 60 tablet 3    Sig: TAKE 1 TABLET BY MOUTH TWICE A DAY     Analgesics:  NSAIDS Failed - 03/19/2022  1:32 AM      Failed - Manual Review: Labs are only required if the patient has taken medication for more than 8 weeks.      Failed - Cr in normal range and within 360 days    Creatinine  Date Value Ref Range Status  06/23/2014 0.93 mg/dL Final    Comment:    0.44-1.00 NOTE: New Reference Range  05/15/14    Creatinine, Ser  Date Value Ref Range Status  02/12/2022 1.03 (H) 0.57 - 1.00 mg/dL Final         Passed - HGB in normal range and within 360 days    Hemoglobin  Date Value Ref Range Status  02/12/2022 14.7 11.1 - 15.9 g/dL Final         Passed - PLT in normal range and within 360 days    Platelets  Date Value Ref Range Status  02/12/2022 240 150 - 450 x10E3/uL Final         Passed - HCT in normal range and within 360 days    Hematocrit  Date Value Ref Range Status  02/12/2022 43.1 34.0 - 46.6 % Final         Passed - eGFR is 30 or above and within 360 days    EGFR (African American)  Date Value Ref Range Status  06/23/2014 >60  Final   GFR calc Af Amer  Date Value Ref Range Status  02/05/2020 69 >59 mL/min/1.73 Final    Comment:    **In accordance with recommendations from the NKF-ASN Task force,**   [facility] is in the process of updating its eGFR calculation to the   2021 CKD-EPI creatinine equation that estimates kidney function   without a race variable.    EGFR (Non-African Amer.)  Date Value Ref Range Status  06/23/2014 >60  Final    Comment:    eGFR values <63m/min/1.73 m2 may be an indication of chronic kidney disease (CKD). Calculated eGFR is useful in patients with stable renal function. The eGFR calculation will not be reliable in acutely ill patients when serum creatinine is changing rapidly. It  is not useful in patients on dialysis. The eGFR calculation may not be applicable to patients at the low and high extremes of body sizes, pregnant women, and vegetarians.    GFR, Estimated  Date Value Ref Range Status  10/25/2020 47 (L) >60 mL/min Final    Comment:    (NOTE) Calculated using the CKD-EPI Creatinine Equation (2021)    eGFR  Date Value Ref Range Status  02/12/2022 59 (L) >59 mL/min/1.73 Final         Passed - Patient is not pregnant      Passed - Valid encounter within last 12 months    Recent Outpatient Visits           1 month ago Annual physical exam   [place] [facility]   6 months ago Acute non-recurrent maxillary sinusitis   [place] [facility]   9 months ago Acute pain of left knee   [facility], Jesse Sans, MD   11 months ago Benign essential HTN   [place] [facility] at [facility], Jesse Sans, MD   1 year ago Annual physical exam   [facility] at [facility], Jesse Sans, MD       Future Appointments             In 4 months Army Melia, Jesse Sans, MD [facility] at [facility], [facility]   In 11 months Army Melia, Jesse Sans, MD [facility] Primary Care and Sports Medicine at [facility], [facility]

## 2022-03-20 DIAGNOSIS — Z1211 Encounter for screening for malignant neoplasm of colon: Secondary | ICD-10-CM | POA: Diagnosis not present

## 2022-03-22 LAB — FECAL OCCULT BLOOD, IMMUNOCHEMICAL: Fecal Occult Bld: NEGATIVE

## 2022-05-01 ENCOUNTER — Other Ambulatory Visit: Payer: Self-pay | Admitting: Internal Medicine

## 2022-05-01 DIAGNOSIS — E782 Mixed hyperlipidemia: Secondary | ICD-10-CM

## 2022-05-01 NOTE — Telephone Encounter (Signed)
Requested Prescriptions  Pending Prescriptions Disp Refills   atorvastatin (LIPITOR) 10 MG tablet [Pharmacy Med Name: ATORVASTATIN 10 MG TABLET] 90 tablet 2    Sig: TAKE 1 TABLET BY MOUTH EVERY DAY     Cardiovascular:  Antilipid - Statins Failed - 05/01/2022  1:57 AM      Failed - Lipid Panel in normal range within the last 12 months    Cholesterol, Total  Date Value Ref Range Status  02/12/2022 205 (H) 100 - 199 mg/dL Final   LDL Chol Calc (NIH)  Date Value Ref Range Status  02/12/2022 116 (H) 0 - 99 mg/dL Final   HDL  Date Value Ref Range Status  02/12/2022 58 >39 mg/dL Final   Triglycerides  Date Value Ref Range Status  02/12/2022 180 (H) 0 - 149 mg/dL Final         Passed - Patient is not pregnant      Passed - Valid encounter within last 12 months    Recent Outpatient Visits           2 months ago Annual physical exam   [place] at [facility], Jesse Sans, MD   8 months ago Acute non-recurrent maxillary sinusitis   [place] at [facility], Jesse Sans, MD   11 months ago Acute pain of left knee   [facility] at [facility], Jesse Sans, MD   1 year ago Benign essential HTN   [place] [facility] [place] [facility], Jesse Sans, MD   1 year ago Annual physical exam   [place] at [facility], Jesse Sans, MD       Future Appointments             In 3 months Army Melia, Jesse Sans, MD [place] at [facility], [facility]   In 9 months Army Melia, Jesse Sans, MD [place] at [facility], [facility]

## 2022-05-08 ENCOUNTER — Other Ambulatory Visit: Payer: Self-pay | Admitting: Internal Medicine

## 2022-05-08 DIAGNOSIS — I10 Essential (primary) hypertension: Secondary | ICD-10-CM

## 2022-05-09 ENCOUNTER — Other Ambulatory Visit: Payer: Self-pay | Admitting: Internal Medicine

## 2022-05-09 DIAGNOSIS — I10 Essential (primary) hypertension: Secondary | ICD-10-CM

## 2022-05-11 ENCOUNTER — Encounter: Payer: Self-pay | Admitting: Internal Medicine

## 2022-05-11 ENCOUNTER — Other Ambulatory Visit: Payer: Self-pay | Admitting: Internal Medicine

## 2022-05-11 DIAGNOSIS — I10 Essential (primary) hypertension: Secondary | ICD-10-CM

## 2022-05-11 MED ORDER — METOPROLOL SUCCINATE ER 50 MG PO TB24: 50.0000 mg | ORAL_TABLET | Freq: Every day | ORAL | 3 refills | Status: DC

## 2022-05-11 NOTE — Telephone Encounter (Signed)
Dc 'd 02/12/22 Wyatt Haste CMA "completed course"  Requested Prescriptions  Refused Prescriptions Disp Refills   metoprolol succinate (TOPROL-XL) 50 MG 24 hr tablet [Pharmacy Med Name: METOPROLOL SUCC ER 50 MG TAB] 90 tablet 3    Sig: TAKE 1 TABLET BY MOUTH DAILY. TAKE WITH OR IMMEDIATELY FOLLOWING A MEAL.     Cardiovascular:  Beta Blockers Passed - 05/09/2022  8:51 AM      Passed - Last BP in normal range    BP Readings from Last 1 Encounters:  02/12/22 128/78         Passed - Last Heart Rate in normal range    Pulse Readings from Last 1 Encounters:  02/12/22 71         Passed - Valid encounter within last 6 months    Recent Outpatient Visits           2 months ago Annual physical exam   [place] at [facility], Jesse Sans, MD   8 months ago Acute non-recurrent maxillary sinusitis   [place] [facility] at [facility], Jesse Sans, MD   11 months ago Acute pain of left knee   [facility] at [facility], Jesse Sans, MD   1 year ago Benign essential HTN    Primary Care & Sports Medicine at [facility], Jesse Sans, MD   1 year ago Annual physical exam   [place] at [facility], Jesse Sans, MD       Future Appointments             In 3 months Army Melia, Jesse Sans, MD [place] at [facility], [facility]   In 9 months Army Melia, Jesse Sans, MD [place] at [facility], [facility]

## 2022-07-21 ENCOUNTER — Other Ambulatory Visit: Payer: Self-pay | Admitting: Internal Medicine

## 2022-07-21 DIAGNOSIS — M25562 Pain in left knee: Secondary | ICD-10-CM

## 2022-07-21 NOTE — Telephone Encounter (Signed)
Requested medication (s) are due for refill today: Yes  Requested medication (s) are on the active medication list: Yes  Last refill:  03/19/22  Future visit scheduled: Yes  Notes to clinic:  See request.    Requested Prescriptions  Pending Prescriptions Disp Refills   diclofenac (VOLTAREN) 75 MG EC tablet [Pharmacy Med Name: DICLOFENAC SOD EC 75 MG TAB] 60 tablet 3    Sig: TAKE 1 TABLET BY MOUTH TWICE A DAY     Analgesics:  NSAIDS Failed - 07/21/2022  2:46 AM      Failed - Manual Review: Labs are only required if the patient has taken medication for more than 8 weeks.      Failed - Cr in normal range and within 360 days    Creatinine  Date Value Ref Range Status  06/23/2014 0.93 mg/dL Final    Comment:    1.61-0.96 NOTE: New Reference Range  05/15/14    Creatinine, Ser  Date Value Ref Range Status  02/12/2022 1.03 (H) 0.57 - 1.00 mg/dL Final         Passed - HGB in normal range and within 360 days    Hemoglobin  Date Value Ref Range Status  02/12/2022 14.7 11.1 - 15.9 g/dL Final         Passed - PLT in normal range and within 360 days    Platelets  Date Value Ref Range Status  02/12/2022 240 150 - 450 x10E3/uL Final         Passed - HCT in normal range and within 360 days    Hematocrit  Date Value Ref Range Status  02/12/2022 43.1 34.0 - 46.6 % Final         Passed - eGFR is 30 or above and within 360 days    EGFR (African American)  Date Value Ref Range Status  06/23/2014 >60  Final   GFR calc Af Amer  Date Value Ref Range Status  02/05/2020 69 >59 mL/min/1.73 Final    Comment:    **In accordance with recommendations from the NKF-ASN Task force,**   [facility] is in the process of updating its eGFR calculation to the   2021 CKD-EPI creatinine equation that estimates kidney function   without a race variable.    EGFR (Non-African Amer.)  Date Value Ref Range Status  06/23/2014 >60  Final    Comment:    eGFR values <65mL/min/1.73 m2 may be an  indication of chronic kidney disease (CKD). Calculated eGFR is useful in patients with stable renal function. The eGFR calculation will not be reliable in acutely ill patients when serum creatinine is changing rapidly. It is not useful in patients on dialysis. The eGFR calculation may not be applicable to patients at the low and high extremes of body sizes, pregnant women, and vegetarians.    GFR, Estimated  Date Value Ref Range Status  10/25/2020 47 (L) >60 mL/min Final    Comment:    (NOTE) Calculated using the CKD-EPI Creatinine Equation (2021)    eGFR  Date Value Ref Range Status  02/12/2022 59 (L) >59 mL/min/1.73 Final         Passed - Patient is not pregnant      Passed - Valid encounter within last 12 months    Recent Outpatient Visits           5 months ago Annual physical exam   [facility] at [facility], Nyoka Cowden, MD  10 months ago Acute non-recurrent maxillary sinusitis   [place] [facility] at [facility], Nyoka Cowden, MD   1 year ago Acute pain of left knee   [facility] at [facility], Nyoka Cowden, MD   1 year ago Benign essential HTN   [place] [facility] & Sports Medicine at [facility], Nyoka Cowden, MD   1 year ago Annual physical exam   [facility] at [facility], Nyoka Cowden, MD       Future Appointments             In 3 weeks Judithann Graves Nyoka Cowden, MD [facility] Primary Care & Sports [facility] at [facility], [facility]   In 7 months Judithann Graves, Nyoka Cowden, MD [facility] Primary Care & Sports Medicine at [facility], [facility]

## 2022-07-31 ENCOUNTER — Other Ambulatory Visit: Payer: Self-pay | Admitting: Internal Medicine

## 2022-07-31 DIAGNOSIS — I10 Essential (primary) hypertension: Secondary | ICD-10-CM

## 2022-07-31 NOTE — Telephone Encounter (Signed)
Requested Prescriptions  Pending Prescriptions Disp Refills   losartan (COZAAR) 50 MG tablet [Pharmacy Med Name: LOSARTAN POTASSIUM 50 MG TAB] 90 tablet 0    Sig: TAKE 1 TABLET BY MOUTH EVERY DAY     Cardiovascular:  Angiotensin Receptor Blockers Failed - 07/31/2022  2:44 AM      Failed - Cr in normal range and within 180 days    Creatinine  Date Value Ref Range Status  06/23/2014 0.93 mg/dL Final    Comment:    4.09-8.11 NOTE: New Reference Range  05/15/14    Creatinine, Ser  Date Value Ref Range Status  02/12/2022 1.03 (H) 0.57 - 1.00 mg/dL Final         Passed - K in normal range and within 180 days    Potassium  Date Value Ref Range Status  02/12/2022 4.5 3.5 - 5.2 mmol/L Final  06/23/2014 3.7 mmol/L Final    Comment:    3.5-5.1 NOTE: New Reference Range  05/15/14          Passed - Patient is not pregnant      Passed - Last BP in normal range    BP Readings from Last 1 Encounters:  02/12/22 128/78         Passed - Valid encounter within last 6 months    Recent Outpatient Visits           5 months ago Annual physical exam   [place] [facility] at [facility], Nyoka Cowden, MD   11 months ago Acute non-recurrent maxillary sinusitis   [place] [facility] at [facility], Nyoka Cowden, MD   1 year ago Acute pain of left knee   [facility] Sports [facility] at [facility], Nyoka Cowden, MD   1 year ago Benign essential HTN   [place] [facility] at [facility], Nyoka Cowden, MD   1 year ago Annual physical exam   [facility] at [facility], Nyoka Cowden, MD       Future Appointments             In 2 weeks Heidi Powell, Nyoka Cowden, MD [facility] Primary [facility] Medicine at [facility], [facility]   In 6 months Heidi Powell, Nyoka Cowden, MD [facility] at [facility], [facility]

## 2022-08-14 ENCOUNTER — Ambulatory Visit (INDEPENDENT_AMBULATORY_CARE_PROVIDER_SITE_OTHER): Payer: Medicare HMO | Admitting: Internal Medicine

## 2022-08-14 ENCOUNTER — Encounter: Payer: Self-pay | Admitting: Internal Medicine

## 2022-08-14 VITALS — BP 136/84 | HR 83 | Ht 64.0 in | Wt 167.6 lb

## 2022-08-14 DIAGNOSIS — M1711 Unilateral primary osteoarthritis, right knee: Secondary | ICD-10-CM | POA: Insufficient documentation

## 2022-08-14 DIAGNOSIS — E782 Mixed hyperlipidemia: Secondary | ICD-10-CM

## 2022-08-14 DIAGNOSIS — I10 Essential (primary) hypertension: Secondary | ICD-10-CM | POA: Diagnosis not present

## 2022-08-14 MED ORDER — LOSARTAN POTASSIUM 100 MG PO TABS: 100.0000 mg | ORAL_TABLET | Freq: Every day | ORAL | 0 refills | Status: DC

## 2022-08-14 NOTE — Assessment & Plan Note (Addendum)
Stable exam with fairly controlled BP.  Currently taking losartan and metoprolol.. Tolerating medications without concerns or side effects. Will continue to recommend low sodium diet but increase losartan to 100 mg Mild RI noted last check and takes Diclofenac regularly

## 2022-08-14 NOTE — Assessment & Plan Note (Signed)
Tolerating statin medications.  No side effects noted. LDL is  Lab Results  Component Value Date   LDLCALC 116 (H) 02/12/2022  On atorvastatin 10 mg.

## 2022-08-14 NOTE — Patient Instructions (Signed)
Increase the Losartan to 100 mg per day.

## 2022-08-14 NOTE — Progress Notes (Signed)
Date:  08/14/2022   Name:  Heidi Powell   DOB:  Dec 13, 1950   MRN:  [id number]   Chief Complaint: Hypertension and Hyperlipidemia  Hypertension This is a chronic problem. The problem is controlled. Pertinent negatives include no chest pain, headaches, palpitations or shortness of breath. Past treatments include angiotensin blockers and beta blockers. Hypertensive end-organ damage includes kidney disease. There is no history of CAD/MI or CVA.  Hyperlipidemia This is a chronic problem. Recent lipid tests were reviewed and are variable. Pertinent negatives include no chest pain or shortness of breath. Current antihyperlipidemic treatment includes statins. The current treatment provides significant improvement of lipids.  Knee Pain  The pain is present in the right knee and left knee. The quality of the pain is described as aching. The pain is mild. The pain has been Fluctuating since onset.    Lab Results  Component Value Date   NA 139 02/12/2022   K 4.5 02/12/2022   CO2 20 02/12/2022   GLUCOSE 103 (H) 02/12/2022   BUN 15 02/12/2022   CREATININE 1.03 (H) 02/12/2022   CALCIUM 9.5 02/12/2022   EGFR 59 (L) 02/12/2022   GFRNONAA 47 (L) 10/25/2020   Lab Results  Component Value Date   CHOL 205 (H) 02/12/2022   HDL 58 02/12/2022   LDLCALC 116 (H) 02/12/2022   TRIG 180 (H) 02/12/2022   CHOLHDL 3.5 02/12/2022   Lab Results  Component Value Date   TSH 0.618 02/12/2022   Lab Results  Component Value Date   HGBA1C 5.6 02/12/2022   Lab Results  Component Value Date   WBC 6.4 02/12/2022   HGB 14.7 02/12/2022   HCT 43.1 02/12/2022   MCV 91 02/12/2022   PLT 240 02/12/2022   Lab Results  Component Value Date   ALT 21 02/12/2022   AST 27 02/12/2022   ALKPHOS 135 (H) 02/12/2022   BILITOT 0.4 02/12/2022   Lab Results  Component Value Date   VD25OH 39.9 11/06/2014     Review of Systems  Constitutional:  Negative for fatigue and unexpected weight change.  HENT:   Negative for nosebleeds.   Eyes:  Negative for visual disturbance.  Respiratory:  Negative for cough, chest tightness, shortness of breath and wheezing.   Cardiovascular:  Negative for chest pain, palpitations and leg swelling.  Gastrointestinal:  Negative for abdominal pain, constipation and diarrhea.  Musculoskeletal:  Positive for arthralgias.  Neurological:  Negative for dizziness, weakness, light-headedness and headaches.  Psychiatric/Behavioral:  Negative for dysphoric mood and sleep disturbance. The patient is not nervous/anxious.     Patient Active Problem List   Diagnosis Date Noted   Primary osteoarthritis of right knee 08/14/2022   Primary insomnia 04/14/2021   Radial scar of left breast 11/04/2020   Female stress incontinence 01/21/2016   Benign essential HTN 12/18/2014   Mixed hyperlipidemia 10/24/2014    Allergies  Allergen Reactions   Penicillin V Potassium Rash    Past Surgical History:  Procedure Laterality Date   BREAST BIOPSY Right    neg   BREAST BIOPSY Left 04/01/2020   stereo, distortion, ribbon clip-COMPLEX SCLEROSING LESION.   BREAST BIOPSY Left 11/20/2020   Procedure: BREAST BIOPSY WITH NEEDLE LOCALIZATION;  Surgeon: Earline Mayotte, MD;  Location: [facility] ORS;  Service: General;  Laterality: Left;   BREAST EXCISIONAL BIOPSY Left 11/20/2020   excision-COMPLEX SCLEROSING LESION.   COLONOSCOPY  08/21/2008   normal   FRACTURE SURGERY Right    arm no hardware  TUBAL LIGATION      Social History   Tobacco Use   Smoking status: Never   Smokeless tobacco: Never   Tobacco comments:    smoking cessation materials not required  Vaping Use   Vaping Use: Never used  Substance Use Topics   Alcohol use: Yes    Alcohol/week: 2.0 standard drinks of alcohol    Types: 2 Glasses of wine per week   Drug use: Never     Medication list has been reviewed and updated.  Current Meds  Medication Sig   atorvastatin (LIPITOR) 10 MG tablet TAKE 1 TABLET BY  MOUTH EVERY DAY   diclofenac (VOLTAREN) 75 MG EC tablet TAKE 1 TABLET BY MOUTH TWICE A DAY   metoprolol succinate (TOPROL XL) 50 MG 24 hr tablet Take 1 tablet (50 mg total) by mouth daily.   [DISCONTINUED] losartan (COZAAR) 50 MG tablet TAKE 1 TABLET BY MOUTH EVERY DAY   [DISCONTINUED] methocarbamol (ROBAXIN) 500 MG tablet Take 1 tablet (500 mg total) by mouth 4 (four) times daily.       02/12/2022    8:27 AM 09/02/2021    1:28 PM 06/05/2021   11:09 AM 04/14/2021    8:57 AM  GAD 7 : Generalized Anxiety Score  Nervous, Anxious, on Edge 0 0 0 0  Control/stop worrying 0 0 0 0  Worry too much - different things 0 0 0 0  Trouble relaxing 0 0 0 0  Restless 0 0 0 0  Easily annoyed or irritable 0 0 0 0  Afraid - awful might happen 0 0 0 0  Total GAD 7 Score 0 0 0 0  Anxiety Difficulty Not difficult at all Not difficult at all  Not difficult at all       02/12/2022    8:27 AM 02/04/2022    9:20 AM 09/02/2021    1:28 PM  Depression screen PHQ 2/9  Decreased Interest 0 0 1  Down, Depressed, Hopeless 0 0 1  PHQ - 2 Score 0 0 2  Altered sleeping 1 2 1   Tired, decreased energy 1 2 2   Change in appetite 0 0 0  Feeling bad or failure about yourself  0 0 0  Trouble concentrating 0 0 0  Moving slowly or fidgety/restless 0 0 0  Suicidal thoughts 0 0 0  PHQ-9 Score 2 4 5   Difficult doing work/chores Not difficult at all Not difficult at all Not difficult at all    BP Readings from Last 3 Encounters:  08/14/22 136/84  02/12/22 128/78  09/02/21 126/76    Physical Exam Vitals and nursing note reviewed.  Constitutional:      General: She is not in acute distress.    Appearance: Normal appearance. She is well-developed.  HENT:     Head: Normocephalic and atraumatic.  Cardiovascular:     Rate and Rhythm: Normal rate and regular rhythm.     Pulses: Normal pulses.     Heart sounds: No murmur heard. Pulmonary:     Effort: Pulmonary effort is normal. No respiratory distress.     Breath  sounds: No wheezing or rhonchi.  Musculoskeletal:     Cervical back: Normal range of motion.     Right knee: No swelling, effusion or crepitus.     Left knee: No swelling, effusion or crepitus.  Skin:    General: Skin is warm and dry.     Findings: No rash.  Neurological:     Mental Status: She is  alert and oriented to person, place, and time.  Psychiatric:        Mood and Affect: Mood normal.        Behavior: Behavior normal.     Wt Readings from Last 3 Encounters:  08/14/22 167 lb 9.6 oz (76 kg)  02/12/22 169 lb (76.7 kg)  02/04/22 169 lb (76.7 kg)    BP 136/84 (BP Location: Left Arm, Cuff Size: Large)   Pulse 83   Ht 5\' 4"  (1.626 m)   Wt 167 lb 9.6 oz (76 kg)   SpO2 98%   BMI 28.77 kg/m   Assessment and Plan:  Problem List Items Addressed This Visit     Primary osteoarthritis of right knee    Daily symptoms are stable. On Diclofenac bid with good response but mild increase in BP Consider seeing SM for recommendations      Mixed hyperlipidemia (Chronic)    Tolerating statin medications.  No side effects noted. LDL is  Lab Results  Component Value Date   LDLCALC 116 (H) 02/12/2022  On atorvastatin 10 mg.       Relevant Medications   losartan (COZAAR) 100 MG tablet   Benign essential HTN - Primary (Chronic)    Stable exam with fairly controlled BP.  Currently taking losartan and metoprolol.. Tolerating medications without concerns or side effects. Will continue to recommend low sodium diet but increase losartan to 100 mg Mild RI noted last check and takes Diclofenac regularly       Relevant Medications   losartan (COZAAR) 100 MG tablet   Other Relevant Orders   Basic metabolic panel    Return in about 2 months (around 10/14/2022) for HTN.   Partially dictated using Dragon software, any errors are not intentional.  Reubin Milan, MD [facility] Primary Care and Sports Medicine [place], [place]

## 2022-08-14 NOTE — Assessment & Plan Note (Signed)
Daily symptoms are stable. On Diclofenac bid with good response but mild increase in BP Consider seeing SM for recommendations

## 2022-08-15 LAB — BASIC METABOLIC PANEL
BUN/Creatinine Ratio: 16 (ref 12–28)
BUN: 21 mg/dL (ref 8–27)
CO2: 19 mmol/L — ABNORMAL LOW (ref 20–29)
Calcium: 9.5 mg/dL (ref 8.7–10.3)
Chloride: 102 mmol/L (ref 96–106)
Creatinine, Ser: 1.33 mg/dL — ABNORMAL HIGH (ref 0.57–1.00)
Glucose: 94 mg/dL (ref 70–99)
Potassium: 4.8 mmol/L (ref 3.5–5.2)
Sodium: 137 mmol/L (ref 134–144)
eGFR: 43 mL/min/{1.73_m2} — ABNORMAL LOW (ref >59)

## 2022-08-19 ENCOUNTER — Ambulatory Visit
Admission: RE | Admit: 2022-08-19 | Discharge: 2022-08-19 | Disposition: A | Discharge status: Home / Self Care | Payer: Medicare HMO | Attending: Family Medicine | Admitting: Family Medicine

## 2022-08-19 ENCOUNTER — Ambulatory Visit (INDEPENDENT_AMBULATORY_CARE_PROVIDER_SITE_OTHER): Payer: Medicare HMO | Admitting: Family Medicine

## 2022-08-19 ENCOUNTER — Encounter: Payer: Self-pay | Admitting: Family Medicine

## 2022-08-19 ENCOUNTER — Ambulatory Visit
Admission: RE | Admit: 2022-08-19 | Discharge: 2022-08-19 | Disposition: A | Discharge status: Home / Self Care | Payer: Medicare HMO | Source: Ambulatory Visit | Attending: Family Medicine | Admitting: Family Medicine

## 2022-08-19 VITALS — BP 130/82 | HR 72 | Ht 64.0 in | Wt 166.0 lb

## 2022-08-19 DIAGNOSIS — M1711 Unilateral primary osteoarthritis, right knee: Secondary | ICD-10-CM | POA: Insufficient documentation

## 2022-08-19 DIAGNOSIS — M25561 Pain in right knee: Secondary | ICD-10-CM | POA: Diagnosis not present

## 2022-08-19 MED ORDER — MELOXICAM 15 MG PO TABS
15.0000 mg | ORAL_TABLET | Freq: Every day | ORAL | 0 refills | Status: DC
Start: 2022-08-19 — End: 2023-02-10

## 2022-08-19 NOTE — Progress Notes (Signed)
     Primary Care / Sports Medicine Office Visit  Patient Information:  Patient ID: Heidi Powell, female DOB: [birth year] Age: 72 y.o. MRN: [id number]   Heidi Powell is a pleasant 72 y.o. female presenting with the following:  Chief Complaint  Patient presents with   Knee Pain    Right knee for a "long time"    Vitals:   08/19/22 0948  BP: 130/82  Pulse: 72  SpO2: 98%   Vitals:   08/19/22 0948  Weight: 166 lb (75.3 kg)  Height: 5\' 4"  (1.626 m)   Body mass index is 28.49 kg/m.  No results found.   Independent interpretation of notes and tests performed by another provider:   Independent interpretation of right knee x-rays dated 08/19/2022 demonstrates mild medial tibiofemoral narrowing, trace superior patellar osteophyte, mild patellofemoral joint space loss noted primarily at the medial femoral condyle, no acute osseous processes noted.  Procedures performed:   None  Pertinent History, Exam, Impression, and Recommendations:   Heidi Powell was seen today for knee pain.  Primary osteoarthritis of right knee Assessment & Plan: Chronic condition, actively symptomatic, has suboptimal response on diclofenac 75 mg twice daily.  Pain anteromedially, worse with weightbearing, ADLs, despite this tries to maintain regular activity (walking, going to gym, etc.).  Exam shows primary focality to the medial tibiofemoral joint line, superior aspect of the patellar facets both medially and laterally, no ligamentous laxity, equivocal McMurray's localizing laterally, trace effusion.  After a review of both surgical and nonsurgical treatment strategies, plan as follows: - Transition to meloxicam daily x 2-4 weeks (take with food) - For persistent pain, utilize ice at 20-minute intervals, further pain can be treated with Tylenol (acetaminophen) - For pain not responding to the above, use tramadol (previous Rx) - Start home exercises in 1 week - Return for follow-up in 4  weeks - Contact us for any worsening symptoms or questions between now and then  Orders: -     DG Knee Complete 4 Views Right; Future -     Meloxicam; Take 1 tablet (15 mg total) by mouth daily.  Dispense: 30 tablet; Refill: 0     Orders & Medications Meds ordered this encounter  Medications   meloxicam (MOBIC) 15 MG tablet    Sig: Take 1 tablet (15 mg total) by mouth daily.    Dispense:  30 tablet    Refill:  0   Orders Placed This Encounter  Procedures   DG Knee Complete 4 Views Right     Return in about 4 weeks (around 09/16/2022) for f/u right knee.     Jerrol Banana, MD, [facility]   Primary Care Sports Medicine Primary Care and Sports Medicine at [facility]

## 2022-08-19 NOTE — Patient Instructions (Signed)
-   Transition to meloxicam daily x 2-4 weeks (take with food) - For persistent pain, utilize ice at 20-minute intervals, further pain can be treated with Tylenol (acetaminophen) - For pain not responding to the above, use tramadol (previous Rx) - Start home exercises in 1 week - Return for follow-up in 4 weeks - Contact us for any worsening symptoms or questions between now and then

## 2022-08-19 NOTE — Assessment & Plan Note (Addendum)
Chronic condition, actively symptomatic, has suboptimal response on diclofenac 75 mg twice daily.  Pain anteromedially, worse with weightbearing, ADLs, despite this tries to maintain regular activity (walking, going to gym, etc.).  Exam shows primary focality to the medial tibiofemoral joint line, superior aspect of the patellar facets both medially and laterally, no ligamentous laxity, equivocal McMurray's localizing laterally, trace effusion.  After a review of both surgical and nonsurgical treatment strategies, plan as follows: - Transition to meloxicam daily x 2-4 weeks (take with food) - For persistent pain, utilize ice at 20-minute intervals, further pain can be treated with Tylenol (acetaminophen) - For pain not responding to the above, use tramadol (previous Rx) - Start home exercises in 1 week - Return for follow-up in 4 weeks - Contact us for any worsening symptoms or questions between now and then

## 2022-09-02 ENCOUNTER — Encounter: Payer: Self-pay | Admitting: Family Medicine

## 2022-09-02 ENCOUNTER — Ambulatory Visit (INDEPENDENT_AMBULATORY_CARE_PROVIDER_SITE_OTHER): Payer: Medicare HMO | Admitting: Family Medicine

## 2022-09-02 VITALS — BP 138/78 | HR 78 | Ht 64.0 in | Wt 167.0 lb

## 2022-09-02 DIAGNOSIS — M1711 Unilateral primary osteoarthritis, right knee: Secondary | ICD-10-CM

## 2022-09-02 MED ORDER — PREDNISONE 50 MG PO TABS
50.0000 mg | ORAL_TABLET | Freq: Every day | ORAL | 0 refills | Status: DC
Start: 2022-09-02 — End: 2023-02-10

## 2022-09-02 NOTE — Progress Notes (Signed)
     Primary Care / Sports Medicine Office Visit  Patient Information:  Patient ID: Heidi Powell, female DOB: 1950/11/02 Age: 72 y.o. MRN: [id number]   Heidi Powell is a pleasant 72 y.o. female presenting with the following:  Chief Complaint  Patient presents with   Primary osteoarthritis of right knee    Still hurts, compression helps    Vitals:   09/02/22 1039  BP: 138/78  Pulse: 78  SpO2: 98%   Vitals:   09/02/22 1039  Weight: 167 lb (75.8 kg)  Height: 5\' 4"  (1.626 m)   Body mass index is 28.67 kg/m.     Independent interpretation of notes and tests performed by another provider:   None  Procedures performed:   None  Pertinent History, Exam, Impression, and Recommendations:   Heidi Powell was seen today for primary osteoarthritis of right knee.  Primary osteoarthritis of right knee Assessment & Plan: For follow-up to right knee pain in the setting of underlying osteoarthritis, has been dosing meloxicam with modest response, still symptomatic with interference with ADLs.  Reviewed next steps treatment strategies, patient has opted to proceed as follows: - Start prednisone 50 mg 5-day course - After prednisone has completed restart meloxicam daily - Start use of hinged knee brace (information provided on how to obtain) - Status update after the above to determine the need for further escalation - Those measures can include consideration of intra-articular corticosteroid injection, imaging, further oral pharmacotherapy options  Orders: -     predniSONE; Take 1 tablet (50 mg total) by mouth daily.  Dispense: 5 tablet; Refill: 0     Orders & Medications Meds ordered this encounter  Medications   predniSONE (DELTASONE) 50 MG tablet    Sig: Take 1 tablet (50 mg total) by mouth daily.    Dispense:  5 tablet    Refill:  0   No orders of the defined types were placed in this encounter.    No follow-ups on file.     Jerrol Banana, MD,  [facility]   Primary Care Sports Medicine Primary Care and Sports Medicine at [facility]

## 2022-09-02 NOTE — Patient Instructions (Addendum)
-   Stop meloxicam - Start prednisone course - After prednisone completed, restart meloxicam daily - Use hinged knee brace as discussed - Continue activity as tolerated using knee symptoms as a guide - Contact us next week after steroids have completed to provide a status update to discuss next steps

## 2022-09-02 NOTE — Assessment & Plan Note (Signed)
For follow-up to right knee pain in the setting of underlying osteoarthritis, has been dosing meloxicam with modest response, still symptomatic with interference with ADLs.  Reviewed next steps treatment strategies, patient has opted to proceed as follows: - Start prednisone 50 mg 5-day course - After prednisone has completed restart meloxicam daily - Start use of hinged knee brace (information provided on how to obtain) - Status update after the above to determine the need for further escalation - Those measures can include consideration of intra-articular corticosteroid injection, imaging, further oral pharmacotherapy options

## 2022-09-06 ENCOUNTER — Encounter: Payer: Self-pay | Admitting: Family Medicine

## 2022-09-07 NOTE — Telephone Encounter (Signed)
Please advise 

## 2022-09-15 NOTE — Telephone Encounter (Signed)
fyi

## 2022-09-22 ENCOUNTER — Ambulatory Visit: Payer: Medicare HMO | Admitting: Family Medicine

## 2022-09-29 NOTE — Telephone Encounter (Signed)
FYI  KP

## 2022-10-01 NOTE — Telephone Encounter (Signed)
fyi

## 2022-10-14 ENCOUNTER — Ambulatory Visit: Payer: Medicare HMO | Admitting: Internal Medicine

## 2022-10-16 NOTE — Telephone Encounter (Signed)
FYI  KP

## 2022-11-06 ENCOUNTER — Other Ambulatory Visit: Payer: Self-pay | Admitting: Internal Medicine

## 2022-11-06 DIAGNOSIS — I10 Essential (primary) hypertension: Secondary | ICD-10-CM

## 2022-11-06 NOTE — Telephone Encounter (Signed)
Requested Prescriptions  Pending Prescriptions Disp Refills   losartan (COZAAR) 100 MG tablet [Pharmacy Med Name: LOSARTAN POTASSIUM 100 MG TAB] 90 tablet 0    Sig: TAKE 1 TABLET BY MOUTH EVERY DAY     Cardiovascular:  Angiotensin Receptor Blockers Failed - 11/06/2022  1:38 AM      Failed - Cr in normal range and within 180 days    Creatinine  Date Value Ref Range Status  06/23/2014 0.93 mg/dL Final    Comment:    6.21-3.08 NOTE: New Reference Range  05/15/14    Creatinine, Ser  Date Value Ref Range Status  08/14/2022 1.33 (H) 0.57 - 1.00 mg/dL Final         Passed - K in normal range and within 180 days    Potassium  Date Value Ref Range Status  08/14/2022 4.8 3.5 - 5.2 mmol/L Final  06/23/2014 3.7 mmol/L Final    Comment:    3.5-5.1 NOTE: New Reference Range  05/15/14          Passed - Patient is not pregnant      Passed - Last BP in normal range    BP Readings from Last 1 Encounters:  09/02/22 138/78         Passed - Valid encounter within last 6 months    Recent Outpatient Visits           2 months ago Primary osteoarthritis of right knee   [place] [facility] at [facility], Ocie Bob, MD   2 months ago Primary osteoarthritis of right knee   [facility] at [facility], Ocie Bob, MD   2 months ago Benign essential HTN   [place] [facility] & Sports Medicine at [facility], Nyoka Cowden, MD   8 months ago Annual physical exam   [facility] Care & [facility] Medicine at [facility], Nyoka Cowden, MD   1 year ago Acute non-recurrent maxillary sinusitis   [place] Primary Care & Sports Medicine at [facility], Nyoka Cowden, MD       Future Appointments             In 3 months Judithann Graves, Nyoka Cowden, MD [facility] Primary Care & Sports Medicine at [facility], [facility]

## 2022-12-02 ENCOUNTER — Other Ambulatory Visit: Payer: Self-pay | Admitting: Internal Medicine

## 2022-12-02 DIAGNOSIS — M25562 Pain in left knee: Secondary | ICD-10-CM
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
## 2023-01-08 ENCOUNTER — Other Ambulatory Visit: Payer: Self-pay | Admitting: Internal Medicine

## 2023-01-08 DIAGNOSIS — E782 Mixed hyperlipidemia: Secondary | ICD-10-CM

## 2023-01-28 DIAGNOSIS — H5203 Hypermetropia, bilateral: Secondary | ICD-10-CM | POA: Diagnosis not present

## 2023-01-29 ENCOUNTER — Other Ambulatory Visit: Payer: Self-pay | Admitting: Internal Medicine

## 2023-01-29 DIAGNOSIS — I10 Essential (primary) hypertension: Secondary | ICD-10-CM

## 2023-01-29 NOTE — Telephone Encounter (Signed)
Requested Prescriptions  Pending Prescriptions Disp Refills   losartan (COZAAR) 100 MG tablet [Pharmacy Med Name: LOSARTAN POTASSIUM 100 MG TAB] 90 tablet 0    Sig: TAKE 1 TABLET BY MOUTH EVERY DAY     Cardiovascular:  Angiotensin Receptor Blockers Failed - 01/29/2023  1:34 AM      Failed - Cr in normal range and within 180 days    Creatinine  Date Value Ref Range Status  06/23/2014 0.93 mg/dL Final    Comment:    1.47-8.29 NOTE: New Reference Range  05/15/14    Creatinine, Ser  Date Value Ref Range Status  08/14/2022 1.33 (H) 0.57 - 1.00 mg/dL Final         Passed - K in normal range and within 180 days    Potassium  Date Value Ref Range Status  08/14/2022 4.8 3.5 - 5.2 mmol/L Final  06/23/2014 3.7 mmol/L Final    Comment:    3.5-5.1 NOTE: New Reference Range  05/15/14          Passed - Patient is not pregnant      Passed - Last BP in normal range    BP Readings from Last 1 Encounters:  09/02/22 138/78         Passed - Valid encounter within last 6 months    Recent Outpatient Visits           4 months ago Primary osteoarthritis of right knee   [place] [facility] at [facility], Ocie Bob, MD   5 months ago Primary osteoarthritis of right knee   [facility] Primary Care & Sports Medicine at [facility], Ocie Bob, MD   5 months ago Benign essential HTN   [place] [facility] Care & Sports Medicine at [facility], Nyoka Cowden, MD   11 months ago Annual physical exam   [facility] Primary Care & Sports Medicine at [facility], Nyoka Cowden, MD   1 year ago Acute non-recurrent maxillary sinusitis   [place] Primary Care & Sports Medicine at [facility], Nyoka Cowden, MD       Future Appointments             In 2 weeks Judithann Graves, Nyoka Cowden, MD [facility] Primary Care & Sports Medicine at [facility], [facility]

## 2023-02-10 ENCOUNTER — Encounter: Payer: Self-pay | Admitting: Internal Medicine

## 2023-02-10 ENCOUNTER — Ambulatory Visit: Payer: Medicare HMO

## 2023-02-10 DIAGNOSIS — Z0000 Encounter for general adult medical examination without abnormal findings: Secondary | ICD-10-CM | POA: Diagnosis not present

## 2023-02-10 DIAGNOSIS — Z1231 Encounter for screening mammogram for malignant neoplasm of breast: Secondary | ICD-10-CM

## 2023-02-10 NOTE — Progress Notes (Signed)
Subjective:   Heidi Powell is a 72 y.o. female who presents for Medicare Annual (Subsequent) preventive examination.  Visit Complete: Virtual I connected with  [name] on 02/10/23 by a audio enabled telemedicine application and verified that I am speaking with the correct person using two identifiers.  Patient Location: Home  Provider Location: Office/Clinic  I discussed the limitations of evaluation and management by telemedicine. The patient expressed understanding and agreed to proceed.  Vital Signs: Because this visit was a virtual/telehealth visit, some criteria may be missing or patient reported. Any vitals not documented were not able to be obtained and vitals that have been documented are patient reported.  Cardiac Risk Factors include: advanced age (>52men, >64 women);dyslipidemia;hypertension     Objective:    There were no vitals filed for this visit. There is no height or weight on file to calculate BMI.     02/10/2023    8:22 AM 02/04/2022    9:19 AM 02/03/2021    9:32 AM 11/20/2020    9:47 AM 11/08/2020    1:55 PM 10/25/2020   12:23 PM 01/29/2020    9:34 AM  Advanced Directives  Does Patient Have a Medical Advance Directive? Yes Yes Yes Yes Yes No Yes  Type of Estate agent of [place];Living will Healthcare Power of [employer] of [place];Living will Living will Living will  Healthcare Power of [place];Living will  Does patient want to make changes to medical advance directive? No - Patient declined   No - Guardian declined No - Patient declined    Copy of Healthcare Power of Attorney in Chart? Yes - validated most recent copy scanned in chart (See row information) Yes - validated most recent copy scanned in chart (See row information) Yes - validated most recent copy scanned in chart (See row information)    No - copy requested  Would patient like information on creating a medical advance directive?    No -  Patient declined No - Patient declined No - Patient declined     Current Medications (verified) Outpatient Encounter Medications as of 02/10/2023  Medication Sig   Acetaminophen (TYLENOL ARTHRITIS PAIN PO) Take by mouth as needed.   atorvastatin (LIPITOR) 10 MG tablet TAKE 1 TABLET BY MOUTH EVERY DAY   IBUPROFEN PO Take by mouth as needed.   losartan (COZAAR) 100 MG tablet TAKE 1 TABLET BY MOUTH EVERY DAY   metoprolol succinate (TOPROL XL) 50 MG 24 hr tablet Take 1 tablet (50 mg total) by mouth daily.   Multiple Vitamin (MULTIVITAMIN) tablet Take 1 tablet by mouth daily.   [DISCONTINUED] meloxicam (MOBIC) 15 MG tablet Take 1 tablet (15 mg total) by mouth daily.   [DISCONTINUED] predniSONE (DELTASONE) 50 MG tablet Take 1 tablet (50 mg total) by mouth daily.   No facility-administered encounter medications on file as of 02/10/2023.    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   BREAST BIOPSY Left 11/20/2020   Procedure: BREAST BIOPSY WITH NEEDLE LOCALIZATION;  Surgeon: Earline Mayotte, MD;  Location: [facility] ORS;  Service: General;  Laterality: Left;  BREAST EXCISIONAL BIOPSY Left 11/20/2020   excision-COMPLEX SCLEROSING LESION.   COLONOSCOPY  08/21/2008   normal   FRACTURE SURGERY Right    arm no hardware   TUBAL LIGATION     Family History  Problem Relation Age of Onset   Hypertension Mother    Renal cancer Mother    Stroke Mother    Heart disease Father    Hypertension Father    Stroke Brother    Breast cancer Neg Hx    Social History   Socioeconomic History   Marital status: Divorced    Spouse name: Not on file   Number of  children: 1   Years of education: Not on file   Highest education level: Bachelor's degree (e.g., BA, AB, BS)  Occupational History   Not on file  Tobacco Use   Smoking status: Never   Smokeless tobacco: Never   Tobacco comments:    smoking cessation materials not required  Vaping Use   Vaping status: Never Used  Substance and Sexual Activity   Alcohol use: Yes    Alcohol/week: 2.0 standard drinks of alcohol    Types: 2 Glasses of wine per week   Drug use: Never   Sexual activity: Not Currently    Partners: Male    Birth control/protection: None  Other Topics Concern   Not on file  Social History Narrative   Pt lives alone at home .   Social Determinants of Health   Financial Resource Strain: Low Risk  (02/10/2023)   Overall Financial Resource Strain (CARDIA)    Difficulty of Paying Living Expenses: Not hard at all  Food Insecurity: No Food Insecurity (02/10/2023)   Hunger Vital Sign    Worried About Running Out of Food in the Last Year: Never true    Ran Out of Food in the Last Year: Never true  Transportation Needs: No Transportation Needs (02/10/2023)   PRAPARE - Administrator, Civil Service (Medical): No    Lack of Transportation (Non-Medical): No  Physical Activity: Sufficiently Active (02/10/2023)   Exercise Vital Sign    Days of Exercise per Week: 5 days    Minutes of Exercise per Session: 40 min  Stress: No Stress Concern Present (02/10/2023)   [employer] of Occupational Health - Occupational Stress Questionnaire    Feeling of Stress : Not at all  Social Connections: Moderately Isolated (02/10/2023)   Social Connection and Isolation Panel [NHANES]    Frequency of Communication with Friends and Family: More than three times a week    Frequency of Social Gatherings with Friends and Family: More than three times a week    Attends Religious Services: More than 4 times per year    Active Member of [employer] or Organizations: No    Attends Tax inspector Meetings: Never    Marital Status: Divorced    Tobacco Counseling Counseling given: Not Answered Tobacco comments: smoking cessation materials not required   Clinical Intake:  Pre-visit preparation completed: Yes  Pain : No/denies pain     BMI - recorded: 28.7 Nutritional Status: BMI 25 -29 Overweight Nutritional Risks: None Diabetes: No  How often do you need to have someone help you when you read instructions, pamphlets, or other written materials from your doctor or pharmacy?: 1 - Never  Interpreter Needed?: No  Information entered by :: Kennedy Bucker, LPN   Activities of Daily Living    02/10/2023    8:23 AM 02/06/2023    8:52  AM  In your present state of health, do you have any difficulty performing the following activities:  Hearing? 0 0  Vision? 0 0  Difficulty concentrating or making decisions? 0 0  Walking or climbing stairs? 0 0  Dressing or bathing? 0 0  Doing errands, shopping? 0 0  Preparing Food and eating ? N N  Using the Toilet? N N  In the past six months, have you accidently leaked urine? N Y  Do you have problems with loss of bowel control? N Y  Managing your Medications? N N  Managing your Finances? N N  Housekeeping or managing your Housekeeping? N N    Patient Care Team: Reubin Milan, MD as PCP - General (Internal Medicine) Lamar Blinks, MD as Consulting Physician (Cardiology) Jesusita Oka, MD (Dermatology) Lemar Livings Merrily Pew, MD as Consulting Physician (General Surgery) Anthonette Legato, OD (Optometry) [place], Sheppard Plumber, OD (Optometry)  Indicate any recent Medical Services you may have received from other than [facility] providers in the past year (date may be approximate).     Assessment:   This is a routine wellness examination for [place].  Hearing/Vision screen Hearing Screening - Comments:: No aids Vision Screening - Comments:: Readers, driving- Dr.Nice   Goals Addressed             This Visit's  Progress    DIET - EAT MORE FRUITS AND VEGETABLES         Depression Screen    02/10/2023    8:20 AM 02/12/2022    8:27 AM 02/04/2022    9:20 AM 09/02/2021    1:28 PM 06/05/2021   11:08 AM 04/14/2021    8:57 AM 02/10/2021    8:31 AM  PHQ 2/9 Scores  PHQ - 2 Score 0 0 0 2 0 0 0  PHQ- 9 Score 0 2 4 5 3 1 1     Fall Risk    02/10/2023    8:23 AM 02/06/2023    8:52 AM 02/12/2022    8:28 AM 02/04/2022    9:20 AM 09/02/2021    1:28 PM  Fall Risk   Falls in the past year? 0 0 1 1 1   Number falls in past yr: 0 0 1 1 1   Injury with Fall? 0 0 0 0 1  Risk for fall due to : No Fall Risks  No Fall Risks History of fall(s) History of fall(s)  Follow up Falls prevention discussed;Falls evaluation completed  Falls evaluation completed Falls evaluation completed Falls evaluation completed    MEDICARE RISK AT HOME: Medicare Risk at Home Any stairs in or around the home?: No If so, are there any without handrails?: No Home free of loose throw rugs in walkways, pet beds, electrical cords, etc?: Yes Adequate lighting in your home to reduce risk of falls?: Yes Life alert?: No Use of a cane, walker or w/c?: No Grab bars in the bathroom?: No Shower chair or bench in shower?: Yes Elevated toilet seat or a handicapped toilet?: No  TIMED UP AND GO:  Was the test performed?  No    Cognitive Function:        02/10/2023    8:24 AM 02/04/2022    9:21 AM  6CIT Screen  What Year? 0 points 0 points  What month? 0 points 0 points  What time? 0 points 0 points  Count back from 20 0 points 0 points  Months in reverse 0 points 0 points  Repeat phrase 0 points  0 points  Total Score 0 points 0 points    Immunizations Immunization History  Administered Date(s) Administered   Fluad Quad(high Dose 65+) 01/10/2019, 02/05/2020, 02/10/2021, 02/12/2022   Influenza,inj,Quad PF,6+ Mos 01/21/2016, 01/22/2017, 01/25/2018   Influenza-Unspecified 01/21/2016   PFIZER(Purple Top)SARS-COV-2 Vaccination  06/20/2019, 07/11/2019   Pneumococcal Conjugate-13 01/30/2019   Pneumococcal Polysaccharide-23 02/05/2020   Tdap 10/25/2020   Zoster Recombinant(Shingrix) 10/07/2020, 01/28/2021    TDAP status: Up to date  Flu Vaccine status: Declined, Education has been provided regarding the importance of this vaccine but patient still declined. Advised may receive this vaccine at local pharmacy or Health Dept. Aware to provide a copy of the vaccination record if obtained from local pharmacy or Health Dept. Verbalized acceptance and understanding.  Pneumococcal vaccine status: Up to date  Covid-19 vaccine status: Completed vaccines  Qualifies for Shingles Vaccine? Yes   Zostavax completed No   Shingrix Completed?: Yes  Screening Tests Health Maintenance  Topic Date Due   Colonoscopy  08/22/2018   COVID-19 Vaccine (3 - 2023-24 season) 11/08/2022   INFLUENZA VACCINE  06/07/2023 (Originally 10/08/2022)   MAMMOGRAM  03/19/2023   COLON CANCER SCREENING ANNUAL FOBT  03/21/2023   Medicare Annual Wellness (AWV)  02/10/2024   DTaP/Tdap/Td (2 - Td or Tdap) 10/26/2030   Pneumonia Vaccine 37+ Years old  Completed   DEXA SCAN  Completed   Hepatitis C Screening  Completed   Zoster Vaccines- Shingrix  Completed   HPV VACCINES  Aged Out    Health Maintenance  Health Maintenance Due  Topic Date Due   Colonoscopy  08/22/2018   COVID-19 Vaccine (3 - 2023-24 season) 11/08/2022    Colorectal cancer screening: Type of screening: FOBT/FIT. Completed 03/20/22. Repeat every 1 years  Mammogram status: Completed 03/18/22. Repeat every year  Bone Density status: Completed 03/06/19. Results reflect: Bone density results: OSTEOPENIA. Repeat every 5 years.  Lung Cancer Screening: (Low Dose CT Chest recommended if Age 66-80 years, 20 pack-year currently smoking OR have quit w/in 15years.) does not qualify.    Additional Screening:  Hepatitis C Screening: does qualify; Completed 01/21/16  Vision Screening:  Recommended annual ophthalmology exams for early detection of glaucoma and other disorders of the eye. Is the patient up to date with their annual eye exam?  Yes  Who is the provider or what is the name of the office in which the patient attends annual eye exams? [facility] If pt is not established with a provider, would they like to be referred to a provider to establish care? No .   Dental Screening: Recommended annual dental exams for proper oral hygiene   Community Resource Referral / Chronic Care Management: CRR required this visit?  No   CCM required this visit?  No     Plan:     I have personally reviewed and noted the following in the patient's chart:   Medical and social history Use of alcohol, tobacco or illicit drugs  Current medications and supplements including opioid prescriptions. Patient is not currently taking opioid prescriptions. Functional ability and status Nutritional status Physical activity Advanced directives List of other physicians Hospitalizations, surgeries, and ER visits in previous 12 months Vitals Screenings to include cognitive, depression, and falls Referrals and appointments  In addition, I have reviewed and discussed with patient certain preventive protocols, quality metrics, and best practice recommendations. A written personalized care plan for preventive services as well as general preventive health recommendations were provided to patient.     Markhi Kleckner S  Zachery Dauer, LPN   [id number]   After Visit Summary: (MyChart) Due to this being a telephonic visit, the after visit summary with patients personalized plan was offered to patient via MyChart   Nurse Notes: mammogram referral sent

## 2023-02-10 NOTE — Patient Instructions (Addendum)
[name] , Thank you for taking time to come for your Medicare Wellness Visit. I appreciate your ongoing commitment to your health goals. Please review the following plan we discussed and let me know if I can assist you in the future.   Referrals/Orders/Follow-Ups/Clinician Recommendations: mammogram referral made  You have an order for:  []   2D Mammogram  [x]   3D Mammogram  []   Bone Density     Please call for appointment:  [facility]  [address] [place] [address] [phone] [facility] [address] [place], [place] [address] [phone] [facility] [address] Geanie Logan [place], [place] [address] [phone]   Make sure to wear two-piece clothing.  No lotions, powders, or deodorants the day of the appointment. Make sure to bring picture ID and insurance card.  Bring list of medications you are currently taking including any supplements.   Schedule your [place] screening mammogram through MyChart!   Log into your MyChart account.  Go to 'Visit' (or 'Appointments' if on mobile App) --> Schedule an Appointment  Under 'Select a Reason for Visit' choose the Mammogram Screening option.  Complete the pre-visit questions and select the time and place that best fits your schedule.   This is a list of the screening recommended for you and due dates:  Health Maintenance  Topic Date Due   Colon Cancer Screening  08/22/2018   COVID-19 Vaccine (3 - 2023-24 season) 11/08/2022   Flu Shot  06/07/2023*   Mammogram  03/19/2023   Stool Blood Test  03/21/2023   Medicare Annual Wellness Visit  02/10/2024   DTaP/Tdap/Td vaccine (2 - Td or Tdap) 10/26/2030   Pneumonia Vaccine  Completed   DEXA scan (bone density measurement)  Completed   Hepatitis C Screening  Completed   Zoster (Shingles) Vaccine  Completed   HPV Vaccine  Aged Out  *Topic was  postponed. The date shown is not the original due date.    Advanced directives: (In Chart) A copy of your advanced directives are scanned into your chart should your provider ever need it.  Next Medicare Annual Wellness Visit scheduled for next year: Yes   02/16/23 @ 8:10 am by video

## 2023-02-16 ENCOUNTER — Other Ambulatory Visit: Payer: Self-pay

## 2023-02-16 ENCOUNTER — Encounter: Payer: Self-pay | Admitting: Internal Medicine

## 2023-02-16 ENCOUNTER — Ambulatory Visit (INDEPENDENT_AMBULATORY_CARE_PROVIDER_SITE_OTHER): Payer: Medicare HMO | Admitting: Internal Medicine

## 2023-02-16 VITALS — BP 126/76 | HR 78 | Ht 64.0 in | Wt 167.0 lb

## 2023-02-16 DIAGNOSIS — E782 Mixed hyperlipidemia: Secondary | ICD-10-CM | POA: Diagnosis not present

## 2023-02-16 DIAGNOSIS — M1712 Unilateral primary osteoarthritis, left knee: Secondary | ICD-10-CM | POA: Diagnosis not present

## 2023-02-16 DIAGNOSIS — Z1231 Encounter for screening mammogram for malignant neoplasm of breast: Secondary | ICD-10-CM

## 2023-02-16 DIAGNOSIS — M1711 Unilateral primary osteoarthritis, right knee: Secondary | ICD-10-CM

## 2023-02-16 DIAGNOSIS — I10 Essential (primary) hypertension: Secondary | ICD-10-CM | POA: Diagnosis not present

## 2023-02-16 DIAGNOSIS — Z23 Encounter for immunization: Secondary | ICD-10-CM

## 2023-02-16 DIAGNOSIS — Z1211 Encounter for screening for malignant neoplasm of colon: Secondary | ICD-10-CM

## 2023-02-16 DIAGNOSIS — Z0000 Encounter for general adult medical examination without abnormal findings: Secondary | ICD-10-CM

## 2023-02-16 MED ORDER — MELOXICAM 15 MG PO TABS: 15.0000 mg | ORAL_TABLET | Freq: Every day | ORAL | 0 refills | Status: DC | PRN

## 2023-02-16 NOTE — Assessment & Plan Note (Signed)
Recent increase in discomfort after leaf cleanup. Will resume Mobic 15 mg daily PRN

## 2023-02-16 NOTE — Assessment & Plan Note (Signed)
LDL is  Lab Results  Component Value Date   LDLCALC 116 (H) 02/12/2022   Current regimen is atorvastatin.  Tolerating medications well without issues.

## 2023-02-16 NOTE — Assessment & Plan Note (Signed)
 Controlled BP with normal exam. Current regimen is losartan and metoprolol. Will continue same medications; encourage continued reduced sodium diet.

## 2023-02-16 NOTE — Progress Notes (Signed)
Date:  02/16/2023   Name:  Heidi Powell   DOB:  06/21/1950   MRN:  [id number]   Chief Complaint: Annual Exam Heidi Powell is a 72 y.o. female who presents today for her Complete Annual Exam. She feels fairly well. She reports exercising some. She reports she is sleeping fairly well. Breast complaints - none.  Mammogram: scheduled 03/22/23 DEXA: 02/2019 osteopenia Colonoscopy: 03/2022 FIT neg  Health Maintenance Due  Topic Date Due   Colonoscopy  08/22/2018   COVID-19 Vaccine (3 - 2023-24 season) 11/08/2022    Immunization History  Administered Date(s) Administered   Fluad Quad(high Dose 65+) 01/10/2019, 02/05/2020, 02/10/2021, 02/12/2022   Fluad Trivalent(High Dose 65+) 02/16/2023   Influenza,inj,Quad PF,6+ Mos 01/21/2016, 01/22/2017, 01/25/2018   Influenza-Unspecified 01/21/2016   PFIZER(Purple Top)SARS-COV-2 Vaccination 06/20/2019, 07/11/2019   Pneumococcal Conjugate-13 01/30/2019   Pneumococcal Polysaccharide-23 02/05/2020   Tdap 10/25/2020   Zoster Recombinant(Shingrix) 10/07/2020, 01/28/2021    Hypertension The problem is controlled. Pertinent negatives include no chest pain, headaches, palpitations or shortness of breath. Past treatments include beta blockers and angiotensin blockers. The current treatment provides significant improvement. There is no history of kidney disease, CAD/MI or CVA.  Hyperlipidemia This is a chronic problem. Pertinent negatives include no chest pain or shortness of breath. Current antihyperlipidemic treatment includes statins. The current treatment provides significant improvement of lipids.    Review of Systems  Constitutional:  Negative for chills, fatigue and fever.  HENT:  Negative for congestion, hearing loss and trouble swallowing.   Eyes:  Negative for visual disturbance.  Respiratory:  Negative for cough, chest tightness, shortness of breath and wheezing.   Cardiovascular:  Negative for chest pain, palpitations and  leg swelling.  Gastrointestinal:  Negative for abdominal pain, constipation, diarrhea and vomiting.  Endocrine: Negative for polydipsia and polyuria.  Genitourinary:  Negative for dysuria, frequency and vaginal bleeding.  Musculoskeletal:  Positive for arthralgias (recent new knee strain). Negative for gait problem and joint swelling.  Skin:  Negative for color change and rash.  Neurological:  Negative for dizziness, tremors, light-headedness and headaches.  Hematological:  Negative for adenopathy. Does not bruise/bleed easily.  Psychiatric/Behavioral:  Negative for dysphoric mood and sleep disturbance. The patient is not nervous/anxious.      Lab Results  Component Value Date   NA 137 08/14/2022   K 4.8 08/14/2022   CO2 19 (L) 08/14/2022   GLUCOSE 94 08/14/2022   BUN 21 08/14/2022   CREATININE 1.33 (H) 08/14/2022   CALCIUM 9.5 08/14/2022   EGFR 43 (L) 08/14/2022   GFRNONAA 47 (L) 10/25/2020   Lab Results  Component Value Date   CHOL 205 (H) 02/12/2022   HDL 58 02/12/2022   LDLCALC 116 (H) 02/12/2022   TRIG 180 (H) 02/12/2022   CHOLHDL 3.5 02/12/2022   Lab Results  Component Value Date   TSH 0.618 02/12/2022   Lab Results  Component Value Date   HGBA1C 5.6 02/12/2022   Lab Results  Component Value Date   WBC 6.4 02/12/2022   HGB 14.7 02/12/2022   HCT 43.1 02/12/2022   MCV 91 02/12/2022   PLT 240 02/12/2022   Lab Results  Component Value Date   ALT 21 02/12/2022   AST 27 02/12/2022   ALKPHOS 135 (H) 02/12/2022   BILITOT 0.4 02/12/2022   Lab Results  Component Value Date   VD25OH 39.9 11/06/2014     Patient Active Problem List   Diagnosis Date Noted   Primary osteoarthritis of right  knee 08/14/2022   Primary insomnia 04/14/2021   Radial scar of left breast 11/04/2020   Female stress incontinence 01/21/2016   Benign essential HTN 12/18/2014   Mixed hyperlipidemia 10/24/2014    Allergies  Allergen Reactions   Penicillin V Potassium Rash    Past  Surgical History:  Procedure Laterality Date   BREAST BIOPSY Right    neg   BREAST BIOPSY Left 04/01/2020   stereo, distortion, ribbon clip-COMPLEX SCLEROSING LESION.   BREAST BIOPSY Left 11/20/2020   Procedure: BREAST BIOPSY WITH NEEDLE LOCALIZATION;  Surgeon: Earline Mayotte, MD;  Location: [facility] ORS;  Service: General;  Laterality: Left;   BREAST EXCISIONAL BIOPSY Left 11/20/2020   excision-COMPLEX SCLEROSING LESION.   COLONOSCOPY  08/21/2008   normal   FRACTURE SURGERY Right    arm no hardware   TUBAL LIGATION      Social History   Tobacco Use   Smoking status: Never   Smokeless tobacco: Never   Tobacco comments:    smoking cessation materials not required  Vaping Use   Vaping status: Never Used  Substance Use Topics   Alcohol use: Yes    Alcohol/week: 2.0 standard drinks of alcohol    Types: 2 Glasses of wine per week   Drug use: Never     Medication list has been reviewed and updated.  Current Meds  Medication Sig   Acetaminophen (TYLENOL ARTHRITIS PAIN PO) Take by mouth as needed.   atorvastatin (LIPITOR) 10 MG tablet TAKE 1 TABLET BY MOUTH EVERY DAY   losartan (COZAAR) 100 MG tablet TAKE 1 TABLET BY MOUTH EVERY DAY   meloxicam (MOBIC) 15 MG tablet Take 1 tablet (15 mg total) by mouth daily as needed for pain.   metoprolol succinate (TOPROL XL) 50 MG 24 hr tablet Take 1 tablet (50 mg total) by mouth daily.   Multiple Vitamin (MULTIVITAMIN) tablet Take 1 tablet by mouth daily.   [DISCONTINUED] IBUPROFEN PO Take by mouth as needed.       02/16/2023    8:23 AM 02/12/2022    8:27 AM 09/02/2021    1:28 PM 06/05/2021   11:09 AM  GAD 7 : Generalized Anxiety Score  Nervous, Anxious, on Edge 0 0 0 0  Control/stop worrying 0 0 0 0  Worry too much - different things 1 0 0 0  Trouble relaxing 0 0 0 0  Restless 0 0 0 0  Easily annoyed or irritable 0 0 0 0  Afraid - awful might happen 0 0 0 0  Total GAD 7 Score 1 0 0 0  Anxiety Difficulty Not difficult at all Not  difficult at all Not difficult at all        02/16/2023    8:23 AM 02/10/2023    8:20 AM 02/12/2022    8:27 AM  Depression screen PHQ 2/9  Decreased Interest 0 0 0  Down, Depressed, Hopeless 0 0 0  PHQ - 2 Score 0 0 0  Altered sleeping 1 0 1  Tired, decreased energy 0 0 1  Change in appetite 0 0 0  Feeling bad or failure about yourself  0 0 0  Trouble concentrating 0 0 0  Moving slowly or fidgety/restless 0 0 0  Suicidal thoughts 0 0 0  PHQ-9 Score 1 0 2  Difficult doing work/chores Not difficult at all Not difficult at all Not difficult at all    BP Readings from Last 3 Encounters:  02/16/23 126/76  09/02/22 138/78  08/19/22 130/82  Physical Exam Vitals and nursing note reviewed.  Constitutional:      General: She is not in acute distress.    Appearance: She is well-developed.  HENT:     Head: Normocephalic and atraumatic.     Right Ear: Tympanic membrane and ear canal normal.     Left Ear: Tympanic membrane and ear canal normal.     Nose:     Right Sinus: No maxillary sinus tenderness.     Left Sinus: No maxillary sinus tenderness.  Eyes:     General: No scleral icterus.       Right eye: No discharge.        Left eye: No discharge.     Conjunctiva/sclera: Conjunctivae normal.  Neck:     Thyroid: No thyromegaly.     Vascular: No carotid bruit.  Cardiovascular:     Rate and Rhythm: Normal rate and regular rhythm.     Pulses: Normal pulses.     Heart sounds: Normal heart sounds.  Pulmonary:     Effort: Pulmonary effort is normal. No respiratory distress.     Breath sounds: No wheezing.  Abdominal:     General: Bowel sounds are normal.     Palpations: Abdomen is soft.     Tenderness: There is no abdominal tenderness.  Musculoskeletal:     Cervical back: Normal range of motion. No erythema.     Right knee: No effusion. No tenderness.     Left knee: No effusion. Tenderness present over the medial joint line and patellar tendon.     Right lower leg: Normal.  No edema.     Left lower leg: Normal. No edema.  Lymphadenopathy:     Cervical: No cervical adenopathy.  Skin:    General: Skin is warm and dry.     Findings: No rash.  Neurological:     Mental Status: She is alert and oriented to person, place, and time.     Cranial Nerves: No cranial nerve deficit.     Sensory: No sensory deficit.     Deep Tendon Reflexes: Reflexes are normal and symmetric.  Psychiatric:        Attention and Perception: Attention normal.        Mood and Affect: Mood normal.     Wt Readings from Last 3 Encounters:  02/16/23 167 lb (75.8 kg)  09/02/22 167 lb (75.8 kg)  08/19/22 166 lb (75.3 kg)    BP 126/76 (BP Location: Right Arm, Cuff Size: Large)   Pulse 78   Ht 5\' 4"  (1.626 m)   Wt 167 lb (75.8 kg)   SpO2 97%   BMI 28.67 kg/m   Assessment and Plan:  Problem List Items Addressed This Visit       Unprioritized   Mixed hyperlipidemia (Chronic)    LDL is  Lab Results  Component Value Date   LDLCALC 116 (H) 02/12/2022   Current regimen is atorvastatin.  Tolerating medications well without issues.       Relevant Orders   Lipid panel   Benign essential HTN (Chronic)    Controlled BP with normal exam. Current regimen is losartan and metoprolol. Will continue same medications; encourage continued reduced sodium diet.       Relevant Orders   CBC with Differential/Platelet   Comprehensive metabolic panel   TSH   Urinalysis, Routine w reflex microscopic   Primary osteoarthritis of right knee    Recent increase in discomfort after leaf cleanup. Will resume Mobic 15 mg  daily PRN      Relevant Medications   meloxicam (MOBIC) 15 MG tablet   Other Visit Diagnoses     Annual physical exam    -  Primary   Continue healthy diet, exercise she is up to date on screenings and immunizations   Encounter for screening mammogram for breast cancer       Colon cancer screening       Relevant Orders   Fecal occult blood, imunochemical   Need for  influenza vaccination       Relevant Orders   Flu Vaccine Trivalent High Dose (Fluad) (Completed)       Return in about 6 months (around 08/17/2023) for HTN.    Reubin Milan, MD [facility] Primary Care and Sports Medicine [place]

## 2023-02-17 LAB — CBC WITH DIFFERENTIAL/PLATELET
Basophils Absolute: 0 10*3/uL (ref 0.0–0.2)
Basos: 1 %
EOS (ABSOLUTE): 0.1 10*3/uL (ref 0.0–0.4)
Eos: 2 %
Hematocrit: 42.1 % (ref 34.0–46.6)
Hemoglobin: 13.4 g/dL (ref 11.1–15.9)
Immature Grans (Abs): 0 10*3/uL (ref 0.0–0.1)
Immature Granulocytes: 1 %
Lymphocytes Absolute: 1.9 10*3/uL (ref 0.7–3.1)
Lymphs: 29 %
MCH: 30.3 pg (ref 26.6–33.0)
MCHC: 31.8 g/dL (ref 31.5–35.7)
MCV: 95 fL (ref 79–97)
Monocytes Absolute: 0.4 10*3/uL (ref 0.1–0.9)
Monocytes: 6 %
Neutrophils Absolute: 4.1 10*3/uL (ref 1.4–7.0)
Neutrophils: 61 %
Platelets: 253 10*3/uL (ref 150–450)
RBC: 4.42 x10E6/uL (ref 3.77–5.28)
RDW: 12.3 % (ref 11.7–15.4)
WBC: 6.6 10*3/uL (ref 3.4–10.8)

## 2023-02-17 LAB — COMPREHENSIVE METABOLIC PANEL
ALT: 21 [IU]/L (ref 0–32)
AST: 22 [IU]/L (ref 0–40)
Albumin: 4.5 g/dL (ref 3.9–4.9)
Alkaline Phosphatase: 140 [IU]/L — ABNORMAL HIGH (ref 44–121)
BUN/Creatinine Ratio: 17 (ref 12–28)
BUN: 17 mg/dL (ref 8–27)
Bilirubin Total: 0.3 mg/dL (ref 0.0–1.2)
CO2: 19 mmol/L — ABNORMAL LOW (ref 20–29)
Calcium: 9.7 mg/dL (ref 8.7–10.3)
Chloride: 107 mmol/L — ABNORMAL HIGH (ref 96–106)
Creatinine, Ser: 1 mg/dL (ref 0.57–1.00)
Globulin, Total: 2.6 g/dL (ref 1.5–4.5)
Glucose: 97 mg/dL (ref 70–99)
Potassium: 4.4 mmol/L (ref 3.5–5.2)
Sodium: 143 mmol/L (ref 134–144)
Total Protein: 7.1 g/dL (ref 6.0–8.5)
eGFR: 61 mL/min/{1.73_m2} (ref >59)

## 2023-02-17 LAB — URINALYSIS, ROUTINE W REFLEX MICROSCOPIC
Bilirubin, UA: NEGATIVE
Glucose, UA: NEGATIVE
Ketones, UA: NEGATIVE
Leukocytes,UA: NEGATIVE
Nitrite, UA: NEGATIVE
Protein,UA: NEGATIVE
RBC, UA: NEGATIVE
Specific Gravity, UA: 1.011 (ref 1.005–1.030)
Urobilinogen, Ur: 0.2 mg/dL (ref 0.2–1.0)
pH, UA: 6 (ref 5.0–7.5)

## 2023-02-17 LAB — LIPID PANEL
Chol/HDL Ratio: 3.9 {ratio} (ref 0.0–4.4)
Cholesterol, Total: 204 mg/dL — ABNORMAL HIGH (ref 100–199)
HDL: 52 mg/dL (ref >39)
LDL Chol Calc (NIH): 123 mg/dL — ABNORMAL HIGH (ref 0–99)
Triglycerides: 162 mg/dL — ABNORMAL HIGH (ref 0–149)
VLDL Cholesterol Cal: 29 mg/dL (ref 5–40)

## 2023-02-17 LAB — TSH: TSH: 0.65 u[IU]/mL (ref 0.450–4.500)

## 2023-02-18 DIAGNOSIS — Z1211 Encounter for screening for malignant neoplasm of colon: Secondary | ICD-10-CM | POA: Diagnosis not present

## 2023-02-22 ENCOUNTER — Other Ambulatory Visit: Payer: Self-pay | Admitting: Internal Medicine

## 2023-02-22 ENCOUNTER — Encounter: Payer: Self-pay | Admitting: Internal Medicine

## 2023-02-22 DIAGNOSIS — M1711 Unilateral primary osteoarthritis, right knee: Secondary | ICD-10-CM

## 2023-02-22 MED ORDER — PREDNISONE 10 MG PO TABS: ORAL_TABLET | ORAL | 0 refills | Status: AC

## 2023-02-23 LAB — FECAL OCCULT BLOOD, IMMUNOCHEMICAL: Fecal Occult Bld: NEGATIVE

## 2023-03-05 ENCOUNTER — Other Ambulatory Visit (INDEPENDENT_AMBULATORY_CARE_PROVIDER_SITE_OTHER): Payer: Self-pay | Admitting: Radiology

## 2023-03-05 ENCOUNTER — Ambulatory Visit (INDEPENDENT_AMBULATORY_CARE_PROVIDER_SITE_OTHER): Payer: Medicare HMO | Admitting: Family Medicine

## 2023-03-05 VITALS — BP 150/98 | HR 94 | Ht 64.0 in | Wt 169.6 lb

## 2023-03-05 DIAGNOSIS — M25562 Pain in left knee: Secondary | ICD-10-CM | POA: Diagnosis not present

## 2023-03-05 DIAGNOSIS — M1711 Unilateral primary osteoarthritis, right knee: Secondary | ICD-10-CM

## 2023-03-05 MED ORDER — DICLOFENAC SODIUM 50 MG PO TBEC: 50.0000 mg | DELAYED_RELEASE_TABLET | Freq: Two times a day (BID) | ORAL | 0 refills | Status: DC | PRN

## 2023-03-05 MED ORDER — TRIAMCINOLONE ACETONIDE 40 MG/ML IJ SUSP
40.0000 mg | Freq: Once | INTRAMUSCULAR | Status: AC
  Administered 2023-03-05: 40 mg via INTRA_ARTICULAR

## 2023-03-08 DIAGNOSIS — M1712 Unilateral primary osteoarthritis, left knee: Secondary | ICD-10-CM | POA: Insufficient documentation

## 2023-03-08 DIAGNOSIS — M25562 Pain in left knee: Secondary | ICD-10-CM | POA: Insufficient documentation

## 2023-03-08 NOTE — Patient Instructions (Addendum)
-  LEFT KNEE PAIN: Left knee pain can be caused by various factors, including inflammation or injury to the structures within the knee. Today, we administered a corticosteroid injection into your knee joint to help reduce inflammation and pain. We have also discontinued Meloxicam and prescribed a different NSAID (diclofenac) to be taken twice daily. If the pain persists, we may consider a second injection into the pes anserine bursa, which is a small fluid-filled sac in the knee that can become inflamed.

## 2023-03-08 NOTE — Assessment & Plan Note (Signed)
Right Knee Previously symptomatic, but currently asymptomatic after treatment with Prednisone. -Continue current management.

## 2023-03-08 NOTE — Progress Notes (Signed)
Primary Care / Sports Medicine Office Visit  Patient Information:  Patient ID: Heidi Powell, female DOB: 1951-01-28 Age: 72 y.o. MRN: [id number]   Heidi Powell is a pleasant 72 y.o. female presenting with the following:  Chief Complaint  Patient presents with   Knee Pain    Patient presents today for continued left knee pain. She is having the same knee pain she was having the right knee. She took meloxicam and a dose pak 2 weeks ago and it did not help. She gets relief from using ice but she has to use it constantly.     Vitals:   03/05/23 1501  BP: (!) 150/98  Pulse: 94  SpO2: 99%   Vitals:   03/05/23 1501  Weight: 169 lb 9.6 oz (76.9 kg)  Height: 5\' 4"  (1.626 m)   Body mass index is 29.11 kg/m.  No results found.   Independent interpretation of notes and tests performed by another provider:   None  Procedures performed:   Procedure:  Injection of left knee under ultrasound guidance. Ultrasound guidance utilized for anteromedial approach to the right knee, no effusion Samsung HS60 device utilized with permanent recording / reporting. Verbal informed consent obtained and verified. Skin prepped in a sterile fashion. Ethyl chloride for topical local analgesia.  Completed without difficulty and tolerated well. Medication: triamcinolone acetonide 40 mg/mL suspension for injection 1 mL total and 2 mL lidocaine 1% without epinephrine utilized for needle placement anesthetic Advised to contact for fevers/chills, erythema, induration, drainage, or persistent bleeding.   Pertinent History, Exam, Impression, and Recommendations:   Problem List Items Addressed This Visit       Musculoskeletal and Integument   Primary osteoarthritis of right knee   Right Knee Previously symptomatic, but currently asymptomatic after treatment with Prednisone. -Continue current management.      Relevant Medications   diclofenac (VOLTAREN) 50 MG EC tablet      Other   Arthralgia of left knee - Primary   History of Present Illness [name], a patient with a history of arthritis and previous right knee pain, presents with left knee pain that started about a month ago. The pain is located in the inner aspect of the knee, both above and below the kneecap. The patient describes the pain as constant and severe enough to prevent her from walking or exercising. The pain is worse when the knee is straight and is somewhat relieved by wearing a brace, icing the knee, and elevating it with pillows. The patient has been taking meloxicam and arthritis strength Tylenol (two tablets several times a day) for the pain, but these have not provided significant relief. The patient also reports that her knee was swollen and "squishy" a few days before the visit, but the swelling has since decreased.  Physical Exam MUSCULOSKELETAL: Tenderness upon palpation at medial aspect of left knee, specifically medial collateral ligament. No tenderness upon palpation of lateral joint line. Pain reported upon palpation of medial aspect of left knee cap. No fluid accumulation palpated in left knee. No pain upon palpation of outer kneecap. Pain upon deep bending of the knee. Tenderness at pes anserine bursa.  Assessment and Plan Left Knee Pain Pain started about a month ago, located in the medial aspect of the knee. Pain is severe enough to limit walking and exercise. No significant relief with Meloxicam or Prednisone. Examination reveals tenderness in the medial aspect of the knee and the pes anserine bursa. -Plan for corticosteroid injection  into the knee joint today. -Discontinue Meloxicam and prescribe a different non-steroidal anti-inflammatory drug (NSAID) to be taken twice daily. -Consider a second injection into the pes anserine bursa if pain persists.      Relevant Orders   [place] LIMITED JOINT SPACE STRUCTURES LOW LEFT     Orders & Medications Medications:  Meds ordered this  encounter  Medications   triamcinolone acetonide (KENALOG-40) injection 40 mg   diclofenac (VOLTAREN) 50 MG EC tablet    Sig: Take 1 tablet (50 mg total) by mouth 2 (two) times daily as needed.    Dispense:  60 tablet    Refill:  0   Orders Placed This Encounter  Procedures   [place] LIMITED JOINT SPACE STRUCTURES LOW LEFT     No follow-ups on file.     Jerrol Banana, MD, [facility]   Primary Care Sports Medicine Primary Care and Sports Medicine at [facility]

## 2023-03-08 NOTE — Assessment & Plan Note (Addendum)
History of Present Illness Heidi Powell, a patient with a history of arthritis and previous right knee pain, presents with left knee pain that started about a month ago. The pain is located in the inner aspect of the knee, both above and below the kneecap. The patient describes the pain as constant and severe enough to prevent her from walking or exercising. The pain is worse when the knee is straight and is somewhat relieved by wearing a brace, icing the knee, and elevating it with pillows. The patient has been taking meloxicam and arthritis strength Tylenol (two tablets several times a day) for the pain, but these have not provided significant relief. The patient also reports that her knee was swollen a few days before the visit, but the swelling has since decreased.  Physical Exam MUSCULOSKELETAL: Tenderness upon palpation at medial aspect of left knee, specifically medial collateral ligament. No tenderness upon palpation of lateral joint line. Pain reported upon palpation of medial aspect of left knee cap. No fluid accumulation palpated in left knee. No pain upon palpation of outer kneecap. Pain upon deep bending of the knee. Tenderness at pes anserine bursa.  Assessment and Plan Left Knee Pain - arthralgia Pain started about a month ago, located in the medial aspect of the knee. Pain is severe enough to limit walking and exercise. No significant relief with Meloxicam or Prednisone. Examination reveals tenderness in the medial aspect of the knee and the pes anserine bursa. -Plan for corticosteroid injection into the knee joint today. -Discontinue Meloxicam and prescribe a different non-steroidal anti-inflammatory drug (NSAID) to be taken twice daily. -Consider a second injection into the pes anserine bursa if pain persists.

## 2023-03-11 ENCOUNTER — Encounter: Payer: Self-pay | Admitting: Family Medicine

## 2023-03-11 NOTE — Telephone Encounter (Signed)
 Please review.  KP

## 2023-03-17 ENCOUNTER — Encounter: Payer: Self-pay | Admitting: Internal Medicine

## 2023-03-18 ENCOUNTER — Encounter: Payer: Self-pay | Admitting: Family Medicine

## 2023-03-18 ENCOUNTER — Ambulatory Visit (INDEPENDENT_AMBULATORY_CARE_PROVIDER_SITE_OTHER): Payer: Medicare HMO | Admitting: Family Medicine

## 2023-03-18 ENCOUNTER — Ambulatory Visit: Payer: Medicare HMO | Admitting: Physician Assistant

## 2023-03-18 VITALS — BP 140/80 | HR 87 | Ht 64.0 in | Wt 163.4 lb

## 2023-03-18 DIAGNOSIS — I10 Essential (primary) hypertension: Secondary | ICD-10-CM

## 2023-03-18 DIAGNOSIS — J0100 Acute maxillary sinusitis, unspecified: Secondary | ICD-10-CM | POA: Insufficient documentation

## 2023-03-18 DIAGNOSIS — M25562 Pain in left knee: Secondary | ICD-10-CM | POA: Diagnosis not present

## 2023-03-18 NOTE — Assessment & Plan Note (Signed)
 The knee, which was previously rated as a 2 out of 10 on how well it was doing, has improved to an 8 out of 10 following a corticosteroid injection at last visit on 03/08/2023.  The patient has not resumed their regular walking routine or gym activities yet, but reports significant improvement in their knee condition.  Knee arthralgia left Improvement in pain with cortisone injection. Discussed potential future treatment options including viscosupplementation pending possible return of symptoms. -Continue current management and consider gel injections if pain returns.

## 2023-03-18 NOTE — Patient Instructions (Signed)
-   Knee Osteoarthritis:   - Continue current management plan   - Consider gel injections if pain ever returns  - Upper Respiratory Infection:   - Start Claritin, Flonase , and Mucinex  12-hour, can use Delsym as needed   - Re-evaluate if no improvement by March 22, 2023 (contact us  via MyChart/phone call)  - Hypertension:   - Discontinue over-the-counter cold medications that have decongestants   - Monitor blood pressure   - Report any persistent elevation

## 2023-03-18 NOTE — Progress Notes (Signed)
 Primary Care / Sports Medicine Office Visit  Patient Information:  Patient ID: Heidi Powell, female DOB: Jan 31, 1951 Age: 73 y.o. MRN: [id number]   Heidi Powell is a pleasant 73 y.o. female presenting with the following:  Chief Complaint  Patient presents with   Cough    Patient presents today for cough x 2 weeks and 2 days. She has tried OTC Coricidin and [facility] cough DM syrup for cough but her cough is still there. She is concerned because she is hearing her heart beat in her ear and her b/p has been up. She is wondering if the medication is making her have these symptoms. She would like to get something else for her cough.     Vitals:   03/18/23 1112  BP: (!) 140/80  Pulse: 87  SpO2: 97%   Vitals:   03/18/23 1112  Weight: 163 lb 6.4 oz (74.1 kg)  Height: 5' 4 (1.626 m)   Body mass index is 28.05 kg/m.  US  LIMITED JOINT SPACE STRUCTURES LOW LEFT Result Date: 03/08/2023 Procedure:  Injection of left knee under ultrasound guidance. Ultrasound guidance utilized for anteromedial approach to the right knee, no effusion Samsung HS60 device utilized with permanent recording / reporting. Verbal informed consent obtained and verified. Skin prepped in a sterile fashion. Ethyl chloride for topical local analgesia. Completed without difficulty and tolerated well. Medication: triamcinolone  acetonide 40 mg/mL suspension for injection 1 mL total and 2 mL lidocaine  1% without epinephrine  utilized for needle placement anesthetic Advised to contact for fevers/chills, erythema, induration, drainage, or persistent bleeding.     Independent interpretation of notes and tests performed by another provider:   None  Procedures performed:   None  Pertinent History, Exam, Impression, and Recommendations:   Problem List Items Addressed This Visit     Acute non-recurrent maxillary sinusitis - Primary   The patient has been experiencing a cough for the past two weeks, which  started before Christmas. The cough has been producing green phlegm, but the patient reports that the color has changed to clear in the past day. The cough is mostly bothersome at night and has been causing some sleep disturbances.  Overall reporting steady improvement. The patient has been using over-the-counter medications, including a [facility] brand of DayQuil and NyQuil, and a cough medicine similar to Delsym, to manage their symptoms.  Physical Exam HEENT: Right ear exhibits clear fluid posterior to the tympanic membrane.  Contralateral benign. Nasal turbinates slightly erythematous and mildly swollen.  Oropharynx benign.  Mild tenderness at the bilateral maxillary sinus regions. CHEST: Lungs clear to auscultation bilaterally without wheezes, rales, rhonchi. CARDIOVASCULAR: Heart sounds benign.  Assessment and Plan  Maxillary sinusitis Persistent cough for 2 weeks with steady improvement. Discussed the potential need for antibiotics if no further improvement. -Start Claritin, Flonase , and Mucinex  12-hour for symptom relief. -Avoid decongestant formulations due to comorbid hypertension. -Consider antibiotics if no further improvement by 03/22/2023.  Patient contact us  via MyChart/phone call if this is the case.       Arthralgia of left knee   The knee, which was previously rated as a 2 out of 10 on how well it was doing, has improved to an 8 out of 10 following a corticosteroid injection at last visit on 03/08/2023.  The patient has not resumed their regular walking routine or gym activities yet, but reports significant improvement in their knee condition.  Knee arthralgia left Improvement in pain with cortisone injection. Discussed potential future treatment  options including viscosupplementation pending possible return of symptoms. -Continue current management and consider gel injections if pain returns.      Benign essential HTN (Chronic)   The patient also reports high blood pressure,  which has been causing them to hear their heartbeat in their ears. This symptom has been ongoing for several days and has been causing the patient some anxiety. The patient's blood pressure readings have been high, with a reading of 175/108 at one point.  While she has been compliant with her current regimen, she has been dosing OTC formulations with decongestants.  Hypertension Elevated blood pressure likely secondary to over-the-counter cold medications and persistent cough. Discussed the need for consistent blood pressure control. -Discontinue over-the-counter cold medications with decongestants. -Continue monitoring blood pressure and report any persistent elevation.      I provided a total time of 33 minutes including both face-to-face and non-face-to-face time on 03/18/2023 inclusive of time utilized for medical chart review, information gathering, care coordination with staff, and documentation completion.   Orders & Medications Medications: No orders of the defined types were placed in this encounter.  No orders of the defined types were placed in this encounter.    No follow-ups on file.     Selinda JINNY Ku, MD, [facility]   Primary Care Sports Medicine Primary Care and Sports Medicine at [facility] [place]

## 2023-03-18 NOTE — Assessment & Plan Note (Signed)
 The patient also reports high blood pressure, which has been causing them to hear their heartbeat in their ears. This symptom has been ongoing for several days and has been causing the patient some anxiety. The patient's blood pressure readings have been high, with a reading of 175/108 at one point.  While she has been compliant with her current regimen, she has been dosing OTC formulations with decongestants.  Hypertension Elevated blood pressure likely secondary to over-the-counter cold medications and persistent cough. Discussed the need for consistent blood pressure control. -Discontinue over-the-counter cold medications with decongestants. -Continue monitoring blood pressure and report any persistent elevation.

## 2023-03-18 NOTE — Assessment & Plan Note (Addendum)
 The patient has been experiencing a cough for the past two weeks, which started before Christmas. The cough has been producing green phlegm, but the patient reports that the color has changed to clear in the past day. The cough is mostly bothersome at night and has been causing some sleep disturbances.  Overall reporting steady improvement. The patient has been using over-the-counter medications, including a [employer] brand of DayQuil and NyQuil, and a cough medicine similar to Delsym, to manage their symptoms.  Physical Exam HEENT: Right ear exhibits clear fluid posterior to the tympanic membrane.  Contralateral benign. Nasal turbinates slightly erythematous and mildly swollen.  Oropharynx benign.  Mild tenderness at the bilateral maxillary sinus regions. CHEST: Lungs clear to auscultation bilaterally without wheezes, rales, rhonchi. CARDIOVASCULAR: Heart sounds benign.  Assessment and Plan  Maxillary sinusitis Persistent cough for 2 weeks with steady improvement. Discussed the potential need for antibiotics if no further improvement. -Start Claritin, Flonase , and Mucinex  12-hour for symptom relief. -Avoid decongestant formulations due to comorbid hypertension. -Consider antibiotics if no further improvement by 03/22/2023.  Patient contact us  via MyChart/phone call if this is the case.

## 2023-03-22 ENCOUNTER — Encounter: Payer: Self-pay | Admitting: Family Medicine

## 2023-03-22 ENCOUNTER — Ambulatory Visit: Payer: Medicare HMO

## 2023-03-22 ENCOUNTER — Ambulatory Visit
Admission: RE | Admit: 2023-03-22 | Discharge: 2023-03-22 | Disposition: A | Discharge status: Home / Self Care | Payer: Medicare HMO | Source: Ambulatory Visit | Attending: Internal Medicine | Admitting: Internal Medicine

## 2023-03-22 DIAGNOSIS — Z1231 Encounter for screening mammogram for malignant neoplasm of breast: Secondary | ICD-10-CM | POA: Diagnosis not present

## 2023-03-22 NOTE — Telephone Encounter (Signed)
 Please review.  KP

## 2023-03-23 ENCOUNTER — Ambulatory Visit (INDEPENDENT_AMBULATORY_CARE_PROVIDER_SITE_OTHER): Payer: Medicare HMO | Admitting: Internal Medicine

## 2023-03-23 ENCOUNTER — Encounter: Payer: Self-pay | Admitting: Internal Medicine

## 2023-03-23 VITALS — BP 160/92 | HR 78 | Ht 64.0 in | Wt 161.8 lb

## 2023-03-23 DIAGNOSIS — I10 Essential (primary) hypertension: Secondary | ICD-10-CM | POA: Diagnosis not present

## 2023-03-23 DIAGNOSIS — H6501 Acute serous otitis media, right ear: Secondary | ICD-10-CM

## 2023-03-23 DIAGNOSIS — J0100 Acute maxillary sinusitis, unspecified: Secondary | ICD-10-CM

## 2023-03-23 MED ORDER — PREDNISONE 20 MG PO TABS: 40.0000 mg | ORAL_TABLET | Freq: Every day | ORAL | 0 refills | Status: AC

## 2023-03-23 MED ORDER — AZITHROMYCIN 250 MG PO TABS: ORAL_TABLET | ORAL | 0 refills | Status: AC

## 2023-03-23 NOTE — Telephone Encounter (Signed)
 Please review.  KP

## 2023-03-23 NOTE — Assessment & Plan Note (Signed)
 BP has been high during illness - it was normal at [facility] one month ago Will not change medications at this time Will treat ear and sinus infection and re-evaluate in 6 weeks

## 2023-03-23 NOTE — Progress Notes (Signed)
 Date:  03/23/2023   Name:  Heidi Powell   DOB:  [birth year]   MRN:  [id number]   Chief Complaint: Hypertension (X 1 week BP has been elevated. )  Hypertension This is a chronic problem. The problem is controlled (until recent illness). Associated symptoms include headaches. Pertinent negatives include no chest pain, palpitations or shortness of breath. Past treatments include angiotensin blockers and beta blockers. The current treatment provides moderate improvement.  Ear Fullness  There is pain in the right ear. The current episode started 1 to 4 weeks ago. The problem occurs constantly. The problem has been unchanged. There has been no fever. The patient is experiencing no pain. Associated symptoms include coughing and headaches. Pertinent negatives include no ear discharge, hearing loss or sore throat. Treatments tried: Flonase , Mucinex  and cough suppressant.    Review of Systems  Constitutional:  Negative for chills, fatigue and fever.  HENT:  Positive for congestion and sinus pressure. Negative for ear discharge, hearing loss and sore throat.   Respiratory:  Positive for cough. Negative for chest tightness, shortness of breath and wheezing.   Cardiovascular:  Negative for chest pain and palpitations.  Neurological:  Positive for headaches. Negative for dizziness.     Lab Results  Component Value Date   NA 143 02/16/2023   K 4.4 02/16/2023   CO2 19 (L) 02/16/2023   GLUCOSE 97 02/16/2023   BUN 17 02/16/2023   CREATININE 1.00 02/16/2023   CALCIUM  9.7 02/16/2023   EGFR 61 02/16/2023   GFRNONAA 47 (L) 10/25/2020   Lab Results  Component Value Date   CHOL 204 (H) 02/16/2023   HDL 52 02/16/2023   LDLCALC 123 (H) 02/16/2023   TRIG 162 (H) 02/16/2023   CHOLHDL 3.9 02/16/2023   Lab Results  Component Value Date   TSH 0.650 02/16/2023   Lab Results  Component Value Date   HGBA1C 5.6 02/12/2022   Lab Results  Component Value Date   WBC 6.6 02/16/2023   HGB 13.4  02/16/2023   HCT 42.1 02/16/2023   MCV 95 02/16/2023   PLT 253 02/16/2023   Lab Results  Component Value Date   ALT 21 02/16/2023   AST 22 02/16/2023   ALKPHOS 140 (H) 02/16/2023   BILITOT 0.3 02/16/2023   Lab Results  Component Value Date   VD25OH 39.9 11/06/2014     Patient Active Problem List   Diagnosis Date Noted   Acute non-recurrent maxillary sinusitis 03/18/2023   Arthralgia of left knee 03/08/2023   Primary osteoarthritis of right knee 08/14/2022   Primary insomnia 04/14/2021   Radial scar of left breast 11/04/2020   Female stress incontinence 01/21/2016   Benign essential HTN 12/18/2014   Mixed hyperlipidemia 10/24/2014    Allergies  Allergen Reactions   Penicillin V Potassium Rash    Past Surgical History:  Procedure Laterality Date   BREAST BIOPSY Right    neg   BREAST BIOPSY Left 04/01/2020   stereo, distortion, ribbon clip-COMPLEX SCLEROSING LESION.   BREAST BIOPSY Left 11/20/2020   Procedure: BREAST BIOPSY WITH NEEDLE LOCALIZATION;  Surgeon: Dessa Reyes ORN, MD;  Location: [facility] ORS;  Service: General;  Laterality: Left;   BREAST EXCISIONAL BIOPSY Left 11/20/2020   excision-COMPLEX SCLEROSING LESION.   COLONOSCOPY  08/21/2008   normal   FRACTURE SURGERY Right    arm no hardware   TUBAL LIGATION      Social History   Tobacco Use   Smoking status: Never   Smokeless  tobacco: Never   Tobacco comments:    smoking cessation materials not required  Vaping Use   Vaping status: Never Used  Substance Use Topics   Alcohol use: Yes    Alcohol/week: 2.0 standard drinks of alcohol    Types: 2 Glasses of wine per week   Drug use: Never     Medication list has been reviewed and updated.  Current Meds  Medication Sig   atorvastatin  (LIPITOR) 10 MG tablet TAKE 1 TABLET BY MOUTH EVERY DAY   azithromycin  (ZITHROMAX  Z-PAK) 250 MG tablet UAD   diclofenac  (VOLTAREN ) 50 MG EC tablet Take 1 tablet (50 mg total) by mouth 2 (two) times daily as needed.    losartan  (COZAAR ) 100 MG tablet TAKE 1 TABLET BY MOUTH EVERY DAY   metoprolol  succinate (TOPROL  XL) 50 MG 24 hr tablet Take 1 tablet (50 mg total) by mouth daily.   Multiple Vitamin (MULTIVITAMIN) tablet Take 1 tablet by mouth daily.   predniSONE  (DELTASONE ) 20 MG tablet Take 2 tablets (40 mg total) by mouth daily with breakfast for 5 days.       03/23/2023   10:23 AM 02/16/2023    8:23 AM 02/12/2022    8:27 AM 09/02/2021    1:28 PM  GAD 7 : Generalized Anxiety Score  Nervous, Anxious, on Edge 0 0 0 0  Control/stop worrying 1 0 0 0  Worry too much - different things 0 1 0 0  Trouble relaxing 1 0 0 0  Restless 0 0 0 0  Easily annoyed or irritable 0 0 0 0  Afraid - awful might happen 0 0 0 0  Total GAD 7 Score 2 1 0 0  Anxiety Difficulty Not difficult at all Not difficult at all Not difficult at all Not difficult at all       03/23/2023   10:23 AM 02/16/2023    8:23 AM 02/10/2023    8:20 AM  Depression screen PHQ 2/9  Decreased Interest 0 0 0  Down, Depressed, Hopeless 0 0 0  PHQ - 2 Score 0 0 0  Altered sleeping 2 1 0  Tired, decreased energy 1 0 0  Change in appetite 0 0 0  Feeling bad or failure about yourself  0 0 0  Trouble concentrating 0 0 0  Moving slowly or fidgety/restless 0 0 0  Suicidal thoughts 0 0 0  PHQ-9 Score 3 1 0  Difficult doing work/chores Not difficult at all Not difficult at all Not difficult at all    BP Readings from Last 3 Encounters:  03/23/23 (!) 160/92  03/18/23 (!) 140/80  03/05/23 (!) 150/98    Physical Exam Constitutional:      Appearance: Normal appearance. She is well-developed.  HENT:     Right Ear: Ear canal and external ear normal. No middle ear effusion. Tympanic membrane is erythematous and bulging. Tympanic membrane is not retracted.     Left Ear: Ear canal and external ear normal.  No middle ear effusion. Tympanic membrane is not erythematous or retracted.     Nose:     Right Sinus: Maxillary sinus tenderness present. No  frontal sinus tenderness.     Left Sinus: Maxillary sinus tenderness present. No frontal sinus tenderness.     Mouth/Throat:     Mouth: No oral lesions.     Pharynx: No oropharyngeal exudate or posterior oropharyngeal erythema.  Cardiovascular:     Rate and Rhythm: Normal rate and regular rhythm.     Heart  sounds: Normal heart sounds.  Pulmonary:     Breath sounds: Normal breath sounds. No wheezing or rales.  Lymphadenopathy:     Cervical: No cervical adenopathy.  Neurological:     Mental Status: She is alert and oriented to person, place, and time.     Wt Readings from Last 3 Encounters:  03/23/23 161 lb 12.8 oz (73.4 kg)  03/18/23 163 lb 6.4 oz (74.1 kg)  03/05/23 169 lb 9.6 oz (76.9 kg)    BP (!) 160/92   Pulse 78   Ht 5' 4 (1.626 m)   Wt 161 lb 12.8 oz (73.4 kg)   SpO2 97%   BMI 27.77 kg/m   Assessment and Plan:  Problem List Items Addressed This Visit       Unprioritized   Benign essential HTN (Chronic)   BP has been high during illness - it was normal at [facility] one month ago Will not change medications at this time Will treat ear and sinus infection and re-evaluate in 6 weeks      Acute non-recurrent maxillary sinusitis   Relevant Medications   azithromycin  (ZITHROMAX  Z-PAK) 250 MG tablet   predniSONE  (DELTASONE ) 20 MG tablet   Other Visit Diagnoses       Non-recurrent acute serous otitis media of right ear    -  Primary   Relevant Medications   azithromycin  (ZITHROMAX  Z-PAK) 250 MG tablet   predniSONE  (DELTASONE ) 20 MG tablet       Return in about 6 weeks (around 05/04/2023) for HTN.    Leita HILARIO Adie, MD [facility] [place]

## 2023-04-01 ENCOUNTER — Other Ambulatory Visit: Payer: Self-pay | Admitting: Family Medicine

## 2023-04-01 NOTE — Telephone Encounter (Signed)
Requested Prescriptions  Pending Prescriptions Disp Refills   diclofenac (VOLTAREN) 50 MG EC tablet [Pharmacy Med Name: DICLOFENAC SOD EC 50 MG TAB] 60 tablet 0    Sig: TAKE 1 TABLET BY MOUTH 2 TIMES DAILY AS NEEDED.     Analgesics:  NSAIDS Failed - 04/01/2023  9:55 AM      Failed - Manual Review: Labs are only required if the patient has taken medication for more than 8 weeks.      Passed - Cr in normal range and within 360 days    Creatinine  Date Value Ref Range Status  06/23/2014 0.93 mg/dL Final    Comment:    1.61-0.96 NOTE: New Reference Range  05/15/14    Creatinine, Ser  Date Value Ref Range Status  02/16/2023 1.00 0.57 - 1.00 mg/dL Final         Passed - HGB in normal range and within 360 days    Hemoglobin  Date Value Ref Range Status  02/16/2023 13.4 11.1 - 15.9 g/dL Final         Passed - PLT in normal range and within 360 days    Platelets  Date Value Ref Range Status  02/16/2023 253 150 - 450 x10E3/uL Final         Passed - HCT in normal range and within 360 days    Hematocrit  Date Value Ref Range Status  02/16/2023 42.1 34.0 - 46.6 % Final         Passed - eGFR is 30 or above and within 360 days    EGFR (African American)  Date Value Ref Range Status  06/23/2014 >60  Final   GFR calc Af Amer  Date Value Ref Range Status  02/05/2020 69 >59 mL/min/1.73 Final    Comment:    **In accordance with recommendations from the NKF-ASN Task force,**   [facility] is in the process of updating its eGFR calculation to the   2021 CKD-EPI creatinine equation that estimates kidney function   without a race variable.    EGFR (Non-African Amer.)  Date Value Ref Range Status  06/23/2014 >60  Final    Comment:    eGFR values <36mL/min/1.73 m2 may be an indication of chronic kidney disease (CKD). Calculated eGFR is useful in patients with stable renal function. The eGFR calculation will not be reliable in acutely ill patients when serum creatinine is changing  rapidly. It is not useful in patients on dialysis. The eGFR calculation may not be applicable to patients at the low and high extremes of body sizes, pregnant women, and vegetarians.    GFR, Estimated  Date Value Ref Range Status  10/25/2020 47 (L) >60 mL/min Final    Comment:    (NOTE) Calculated using the CKD-EPI Creatinine Equation (2021)    eGFR  Date Value Ref Range Status  02/16/2023 61 >59 mL/min/1.73 Final         Passed - Patient is not pregnant      Passed - Valid encounter within last 12 months    Recent Outpatient Visits           1 week ago Non-recurrent acute serous otitis media of right ear   [place] [facility] at [facility], Nyoka Cowden, MD   2 weeks ago Acute non-recurrent maxillary sinusitis   [place] [facility] at [facility], Ocie Bob, MD   3 weeks ago Arthralgia of left knee   [facility], Ocie Bob, MD   1 month ago Annual physical exam   [facility] at [facility], Nyoka Cowden, MD   7 months ago Primary osteoarthritis of right knee   [facility] at [facility], Ocie Bob, MD       Future Appointments             In 4 months Judithann Graves, Nyoka Cowden, MD [facility] at [facility], [facility]

## 2023-04-07 ENCOUNTER — Ambulatory Visit (INDEPENDENT_AMBULATORY_CARE_PROVIDER_SITE_OTHER): Payer: Medicare HMO | Admitting: Physician Assistant

## 2023-04-07 ENCOUNTER — Encounter: Payer: Self-pay | Admitting: Physician Assistant

## 2023-04-07 VITALS — BP 154/88 | HR 81 | Temp 98.0°F | Ht 64.0 in | Wt 162.0 lb

## 2023-04-07 DIAGNOSIS — H6993 Unspecified Eustachian tube disorder, bilateral: Secondary | ICD-10-CM

## 2023-04-07 DIAGNOSIS — I10 Essential (primary) hypertension: Secondary | ICD-10-CM

## 2023-04-07 MED ORDER — FEXOFENADINE HCL 180 MG PO TABS: 180.0000 mg | ORAL_TABLET | Freq: Every day | ORAL | Status: DC

## 2023-04-07 NOTE — Progress Notes (Signed)
Date:  04/07/2023   Name:  Heidi Powell   DOB:  [birth year]   MRN:  [id number]   Chief Complaint: Ear Fullness (Hearing heart beat in ears)  Ear Fullness  There is pain in the right ear. This is a chronic problem. The current episode started more than 1 month ago. The problem occurs constantly. There has been no fever. The pain is at a severity of 0/10. The patient is experiencing no pain. Associated symptoms comments: Numbness in jaw at times. Treatments tried: flonase.   Heidi Powell is a pleasant 73 year old female who typically sees my colleague Dr. Bari Edward, here for evaluation of persistent aural fullness on the back end of URI symptoms which have mostly improved.  Currently using intranasal Flonase, stopped antihistamine due to concerns for elevating blood pressure.  Mentions blood pressure has been elevated for the last month or two.  Used to monitor home BP, but this was making her anxious so she stopped.  Currently taking losartan 100 mg and metoprolol 50 mg.  Prior dose increased on metoprolol caused her to feel fatigued.   Medication list has been reviewed and updated.  Current Meds  Medication Sig   atorvastatin (LIPITOR) 10 MG tablet TAKE 1 TABLET BY MOUTH EVERY DAY   diclofenac (VOLTAREN) 50 MG EC tablet TAKE 1 TABLET BY MOUTH 2 TIMES DAILY AS NEEDED.   losartan (COZAAR) 100 MG tablet TAKE 1 TABLET BY MOUTH EVERY DAY   metoprolol succinate (TOPROL XL) 50 MG 24 hr tablet Take 1 tablet (50 mg total) by mouth daily.   Multiple Vitamin (MULTIVITAMIN) tablet Take 1 tablet by mouth daily.     Review of Systems  Patient Active Problem List   Diagnosis Date Noted   Acute non-recurrent maxillary sinusitis 03/18/2023   Arthralgia of left knee 03/08/2023   Primary osteoarthritis of right knee 08/14/2022   Primary insomnia 04/14/2021   Radial scar of left breast 11/04/2020   Female stress incontinence 01/21/2016   Benign essential HTN 12/18/2014   Mixed  hyperlipidemia 10/24/2014    Allergies  Allergen Reactions   Penicillin V Potassium Rash    Immunization History  Administered Date(s) Administered   Fluad Quad(high Dose 65+) 01/10/2019, 02/05/2020, 02/10/2021, 02/12/2022   Fluad Trivalent(High Dose 65+) 02/16/2023   Influenza,inj,Quad PF,6+ Mos 01/21/2016, 01/22/2017, 01/25/2018   Influenza-Unspecified 01/21/2016   PFIZER(Purple Top)SARS-COV-2 Vaccination 06/20/2019, 07/11/2019   Pneumococcal Conjugate-13 01/30/2019   Pneumococcal Polysaccharide-23 02/05/2020   Tdap 10/25/2020   Zoster Recombinant(Shingrix) 10/07/2020, 01/28/2021    Past Surgical History:  Procedure Laterality Date   BREAST BIOPSY Right    neg   BREAST BIOPSY Left 04/01/2020   stereo, distortion, ribbon clip-COMPLEX SCLEROSING LESION.   BREAST BIOPSY Left 11/20/2020   Procedure: BREAST BIOPSY WITH NEEDLE LOCALIZATION;  Surgeon: Earline Mayotte, MD;  Location: [facility] ORS;  Service: General;  Laterality: Left;   BREAST EXCISIONAL BIOPSY Left 11/20/2020   excision-COMPLEX SCLEROSING LESION.   COLONOSCOPY  08/21/2008   normal   FRACTURE SURGERY Right    arm no hardware   TUBAL LIGATION      Social History   Tobacco Use   Smoking status: Never   Smokeless tobacco: Never   Tobacco comments:    smoking cessation materials not required  Vaping Use   Vaping status: Never Used  Substance Use Topics   Alcohol use: Yes    Alcohol/week: 2.0 standard drinks of alcohol    Types: 2 Glasses of wine per week  Drug use: Never    Family History  Problem Relation Age of Onset   Hypertension Mother    Renal cancer Mother    Stroke Mother    Heart disease Father    Hypertension Father    Stroke Brother    Breast cancer Neg Hx         04/07/2023   11:10 AM 03/23/2023   10:23 AM 02/16/2023    8:23 AM 02/12/2022    8:27 AM  GAD 7 : Generalized Anxiety Score  Nervous, Anxious, on Edge 0 0 0 0  Control/stop worrying 0 1 0 0  Worry too much - different  things 0 0 1 0  Trouble relaxing 0 1 0 0  Restless 0 0 0 0  Easily annoyed or irritable 0 0 0 0  Afraid - awful might happen 0 0 0 0  Total GAD 7 Score 0 2 1 0  Anxiety Difficulty Not difficult at all Not difficult at all Not difficult at all Not difficult at all       04/07/2023   11:10 AM 03/23/2023   10:23 AM 02/16/2023    8:23 AM  Depression screen PHQ 2/9  Decreased Interest 0 0 0  Down, Depressed, Hopeless 0 0 0  PHQ - 2 Score 0 0 0  Altered sleeping  2 1  Tired, decreased energy  1 0  Change in appetite  0 0  Feeling bad or failure about yourself   0 0  Trouble concentrating  0 0  Moving slowly or fidgety/restless  0 0  Suicidal thoughts  0 0  PHQ-9 Score  3 1  Difficult doing work/chores  Not difficult at all Not difficult at all    BP Readings from Last 3 Encounters:  04/07/23 (!) 154/88  03/23/23 (!) 160/92  03/18/23 (!) 140/80    Wt Readings from Last 3 Encounters:  04/07/23 162 lb (73.5 kg)  03/23/23 161 lb 12.8 oz (73.4 kg)  03/18/23 163 lb 6.4 oz (74.1 kg)    BP (!) 154/88 (BP Location: Left Arm, Patient Position: Sitting, Cuff Size: Large)   Pulse 81   Temp 98 F (36.7 C)   Ht 5\' 4"  (1.626 m)   Wt 162 lb (73.5 kg)   SpO2 97%   BMI 27.81 kg/m   Physical Exam Vitals and nursing note reviewed.  Constitutional:      Appearance: Normal appearance.  HENT:     Right Ear: No middle ear effusion. Tympanic membrane is not erythematous.     Left Ear: A middle ear effusion is present. Tympanic membrane is not erythematous.  Cardiovascular:     Rate and Rhythm: Normal rate.  Pulmonary:     Effort: Pulmonary effort is normal.  Abdominal:     General: There is no distension.  Musculoskeletal:        General: Normal range of motion.  Skin:    General: Skin is warm and dry.  Neurological:     Mental Status: She is alert and oriented to person, place, and time.     Gait: Gait is intact.  Psychiatric:        Mood and Affect: Mood and affect normal.      Recent Labs     Component Value Date/Time   NA 143 02/16/2023 0950   NA 138 06/23/2014 1210   K 4.4 02/16/2023 0950   K 3.7 06/23/2014 1210   CL 107 (H) 02/16/2023 0950   CL 107 06/23/2014  1210   CO2 19 (L) 02/16/2023 0950   CO2 26 06/23/2014 1210   GLUCOSE 97 02/16/2023 0950   GLUCOSE 135 (H) 10/25/2020 1222   GLUCOSE 120 (H) 06/23/2014 1210   BUN 17 02/16/2023 0950   BUN 16 06/23/2014 1210   CREATININE 1.00 02/16/2023 0950   CREATININE 0.93 06/23/2014 1210   CALCIUM 9.7 02/16/2023 0950   CALCIUM 9.3 06/23/2014 1210   PROT 7.1 02/16/2023 0950   PROT 7.9 06/23/2014 1210   ALBUMIN 4.5 02/16/2023 0950   ALBUMIN 4.4 06/23/2014 1210   AST 22 02/16/2023 0950   AST 28 06/23/2014 1210   ALT 21 02/16/2023 0950   ALT 31 06/23/2014 1210   ALKPHOS 140 (H) 02/16/2023 0950   ALKPHOS 128 (H) 06/23/2014 1210   BILITOT 0.3 02/16/2023 0950   BILITOT 0.6 06/23/2014 1210   GFRNONAA 47 (L) 10/25/2020 1222   GFRNONAA >60 06/23/2014 1210   GFRAA 69 02/05/2020 0901   GFRAA >60 06/23/2014 1210    Lab Results  Component Value Date   WBC 6.6 02/16/2023   HGB 13.4 02/16/2023   HCT 42.1 02/16/2023   MCV 95 02/16/2023   PLT 253 02/16/2023   Lab Results  Component Value Date   HGBA1C 5.6 02/12/2022   Lab Results  Component Value Date   CHOL 204 (H) 02/16/2023   HDL 52 02/16/2023   LDLCALC 123 (H) 02/16/2023   TRIG 162 (H) 02/16/2023   CHOLHDL 3.9 02/16/2023   Lab Results  Component Value Date   TSH 0.650 02/16/2023     Assessment and Plan:  1. Eustachian tube dysfunction, bilateral (Primary) Patient education printed and attached to today's visit.  Discussed conservative measures such as chewing gum and auto insufflation, but also giving sample of fexofenadine.  Patient reassured that since this does not have the decongestant in it, it should not affect blood pressure.  - fexofenadine (ALLEGRA) 180 MG tablet; Take 1 tablet (180 mg total) by mouth daily.  Dispense: 10  tablet  2. Benign essential HTN Blood pressure high x 2 today, similar readings on recent clinic visits.  Suspect whitecoat component and encouraged home BP monitoring with a log for review by myself or Dr. Judithann Graves at next office visit.  Might consider addition of thiazide diuretic which may have added benefit for any potential inner ear problems which may or may not be present.    Return if symptoms worsen or fail to improve.    Alvester Morin, PA-C, DMSc, Nutritionist [facility] [phone]

## 2023-04-07 NOTE — Patient Instructions (Signed)
Reviewed the "Seven S's" of hypertension including salt, smoking, stimulants (e.g. caffeine), stress, sleep, snoring (OSA), sedentary lifestyle.

## 2023-04-20 ENCOUNTER — Encounter: Payer: Self-pay | Admitting: Internal Medicine

## 2023-04-20 ENCOUNTER — Other Ambulatory Visit: Payer: Self-pay | Admitting: Internal Medicine

## 2023-04-20 ENCOUNTER — Ambulatory Visit: Payer: Medicare HMO | Admitting: Internal Medicine

## 2023-04-20 VITALS — BP 146/94 | HR 83 | Ht 64.0 in | Wt 165.0 lb

## 2023-04-20 DIAGNOSIS — H6991 Unspecified Eustachian tube disorder, right ear: Secondary | ICD-10-CM | POA: Diagnosis not present

## 2023-04-20 DIAGNOSIS — I10 Essential (primary) hypertension: Secondary | ICD-10-CM | POA: Diagnosis not present

## 2023-04-20 MED ORDER — OLMESARTAN MEDOXOMIL-HCTZ 40-12.5 MG PO TABS: 1.0000 | ORAL_TABLET | Freq: Every day | ORAL | 1 refills | Status: DC

## 2023-04-20 NOTE — Progress Notes (Signed)
Date:  04/20/2023   Name:  Heidi Powell   DOB:  [birth year]   MRN:  [id number]   Chief Complaint: Ear Fullness (Patient c/o of ears stopped up. Getting better but wants ears checked today. Seen Jesusita Oka 01/14.) and Hypertension (Patient having high readings of BP at home for the last few weeks. Concerns medication is not helping. )  Ear Fullness  There is pain in the right ear. This is a chronic problem. The problem has been gradually improving. Pertinent negatives include no abdominal pain, coughing, diarrhea, headaches or hearing loss.  Hypertension This is a chronic problem. The problem is uncontrolled. Pertinent negatives include no chest pain, headaches, palpitations or shortness of breath. Past treatments include angiotensin blockers and beta blockers. The current treatment provides moderate improvement. There are no compliance problems.     Review of Systems  Constitutional:  Negative for fatigue and unexpected weight change.  HENT:  Positive for congestion. Negative for ear pain, hearing loss and nosebleeds.   Eyes:  Negative for visual disturbance.  Respiratory:  Negative for cough, chest tightness, shortness of breath and wheezing.   Cardiovascular:  Negative for chest pain, palpitations and leg swelling.  Gastrointestinal:  Negative for abdominal pain, constipation and diarrhea.  Neurological:  Negative for dizziness, weakness, light-headedness and headaches.     Lab Results  Component Value Date   NA 143 02/16/2023   K 4.4 02/16/2023   CO2 19 (L) 02/16/2023   GLUCOSE 97 02/16/2023   BUN 17 02/16/2023   CREATININE 1.00 02/16/2023   CALCIUM 9.7 02/16/2023   EGFR 61 02/16/2023   GFRNONAA 47 (L) 10/25/2020   Lab Results  Component Value Date   CHOL 204 (H) 02/16/2023   HDL 52 02/16/2023   LDLCALC 123 (H) 02/16/2023   TRIG 162 (H) 02/16/2023   CHOLHDL 3.9 02/16/2023   Lab Results  Component Value Date   TSH 0.650 02/16/2023   Lab Results  Component Value  Date   HGBA1C 5.6 02/12/2022   Lab Results  Component Value Date   WBC 6.6 02/16/2023   HGB 13.4 02/16/2023   HCT 42.1 02/16/2023   MCV 95 02/16/2023   PLT 253 02/16/2023   Lab Results  Component Value Date   ALT 21 02/16/2023   AST 22 02/16/2023   ALKPHOS 140 (H) 02/16/2023   BILITOT 0.3 02/16/2023   Lab Results  Component Value Date   VD25OH 39.9 11/06/2014     Patient Active Problem List   Diagnosis Date Noted   Acute non-recurrent maxillary sinusitis 03/18/2023   Arthralgia of left knee 03/08/2023   Primary osteoarthritis of right knee 08/14/2022   Primary insomnia 04/14/2021   Radial scar of left breast 11/04/2020   Female stress incontinence 01/21/2016   Benign essential HTN 12/18/2014   Mixed hyperlipidemia 10/24/2014    Allergies  Allergen Reactions   Penicillin V Potassium Rash    Past Surgical History:  Procedure Laterality Date   BREAST BIOPSY Right    neg   BREAST BIOPSY Left 04/01/2020   stereo, distortion, ribbon clip-COMPLEX SCLEROSING LESION.   BREAST BIOPSY Left 11/20/2020   Procedure: BREAST BIOPSY WITH NEEDLE LOCALIZATION;  Surgeon: Earline Mayotte, MD;  Location: [facility] ORS;  Service: General;  Laterality: Left;   BREAST EXCISIONAL BIOPSY Left 11/20/2020   excision-COMPLEX SCLEROSING LESION.   COLONOSCOPY  08/21/2008   normal   FRACTURE SURGERY Right    arm no hardware   TUBAL LIGATION  Social History   Tobacco Use   Smoking status: Never   Smokeless tobacco: Never   Tobacco comments:    smoking cessation materials not required  Vaping Use   Vaping status: Never Used  Substance Use Topics   Alcohol use: Yes    Alcohol/week: 2.0 standard drinks of alcohol    Types: 2 Glasses of wine per week   Drug use: Never     Medication list has been reviewed and updated.  Current Meds  Medication Sig   atorvastatin (LIPITOR) 10 MG tablet TAKE 1 TABLET BY MOUTH EVERY DAY   metoprolol succinate (TOPROL XL) 50 MG 24 hr tablet  Take 1 tablet (50 mg total) by mouth daily.   Multiple Vitamin (MULTIVITAMIN) tablet Take 1 tablet by mouth daily.   olmesartan-hydrochlorothiazide (BENICAR HCT) 40-12.5 MG tablet Take 1 tablet by mouth daily.   [DISCONTINUED] losartan (COZAAR) 100 MG tablet TAKE 1 TABLET BY MOUTH EVERY DAY       04/20/2023    9:58 AM 04/07/2023   11:10 AM 03/23/2023   10:23 AM 02/16/2023    8:23 AM  GAD 7 : Generalized Anxiety Score  Nervous, Anxious, on Edge 0 0 0 0  Control/stop worrying 0 0 1 0  Worry too much - different things 0 0 0 1  Trouble relaxing 0 0 1 0  Restless 0 0 0 0  Easily annoyed or irritable 0 0 0 0  Afraid - awful might happen 0 0 0 0  Total GAD 7 Score 0 0 2 1  Anxiety Difficulty Not difficult at all Not difficult at all Not difficult at all Not difficult at all       04/20/2023    9:58 AM 04/07/2023   11:10 AM 03/23/2023   10:23 AM  Depression screen PHQ 2/9  Decreased Interest 0 0 0  Down, Depressed, Hopeless 0 0 0  PHQ - 2 Score 0 0 0  Altered sleeping   2  Tired, decreased energy   1  Change in appetite   0  Feeling bad or failure about yourself    0  Trouble concentrating   0  Moving slowly or fidgety/restless   0  Suicidal thoughts   0  PHQ-9 Score   3  Difficult doing work/chores Not difficult at all  Not difficult at all    BP Readings from Last 3 Encounters:  04/20/23 (!) 146/94  04/07/23 (!) 154/88  03/23/23 (!) 160/92     Physical Exam Vitals and nursing note reviewed.  Constitutional:      General: She is not in acute distress.    Appearance: Normal appearance. She is well-developed.  HENT:     Head: Normocephalic and atraumatic.     Right Ear: Tympanic membrane is retracted. Tympanic membrane is not erythematous.     Left Ear: Tympanic membrane is retracted. Tympanic membrane is not erythematous.  Cardiovascular:     Rate and Rhythm: Normal rate and regular rhythm.     Heart sounds: No murmur heard. Pulmonary:     Effort: Pulmonary effort  is normal. No respiratory distress.     Breath sounds: No wheezing.  Musculoskeletal:     Cervical back: Normal range of motion.     Right lower leg: No edema.     Left lower leg: No edema.  Skin:    General: Skin is warm and dry.     Findings: No rash.  Neurological:     Mental Status: She  is alert and oriented to person, place, and time.  Psychiatric:        Mood and Affect: Mood normal.        Behavior: Behavior normal.     Wt Readings from Last 3 Encounters:  04/20/23 165 lb (74.8 kg)  04/07/23 162 lb (73.5 kg)  03/23/23 161 lb 12.8 oz (73.4 kg)    BP (!) 146/94   Pulse 83   Ht 5\' 4"  (1.626 m)   Wt 165 lb (74.8 kg)   SpO2 97%   BMI 28.32 kg/m   Assessment and Plan:  Problem List Items Addressed This Visit       Unprioritized   Benign essential HTN - Primary (Chronic)   BP running higher recently. Now mostly recovered from sinus infection Will change to Benicar HCT Follow up if no improvement      Relevant Medications   olmesartan-hydrochlorothiazide (BENICAR HCT) 40-12.5 MG tablet   Other Visit Diagnoses       Eustachian tube dysfunction, right       slowly resolving       No follow-ups on file.    Reubin Milan, MD [facility] [place]

## 2023-04-20 NOTE — Assessment & Plan Note (Signed)
BP running higher recently. Now mostly recovered from sinus infection Will change to Benicar HCT Follow up if no improvement

## 2023-04-20 NOTE — Progress Notes (Unsigned)
Date:  04/20/2023   Name:  Heidi Powell   DOB:  1950/08/24   MRN:  [id number]   Chief Complaint: No chief complaint on file.  HPI  Review of Systems   Lab Results  Component Value Date   NA 143 02/16/2023   K 4.4 02/16/2023   CO2 19 (L) 02/16/2023   GLUCOSE 97 02/16/2023   BUN 17 02/16/2023   CREATININE 1.00 02/16/2023   CALCIUM 9.7 02/16/2023   EGFR 61 02/16/2023   GFRNONAA 47 (L) 10/25/2020   Lab Results  Component Value Date   CHOL 204 (H) 02/16/2023   HDL 52 02/16/2023   LDLCALC 123 (H) 02/16/2023   TRIG 162 (H) 02/16/2023   CHOLHDL 3.9 02/16/2023   Lab Results  Component Value Date   TSH 0.650 02/16/2023   Lab Results  Component Value Date   HGBA1C 5.6 02/12/2022   Lab Results  Component Value Date   WBC 6.6 02/16/2023   HGB 13.4 02/16/2023   HCT 42.1 02/16/2023   MCV 95 02/16/2023   PLT 253 02/16/2023   Lab Results  Component Value Date   ALT 21 02/16/2023   AST 22 02/16/2023   ALKPHOS 140 (H) 02/16/2023   BILITOT 0.3 02/16/2023   Lab Results  Component Value Date   VD25OH 39.9 11/06/2014     Patient Active Problem List   Diagnosis Date Noted   Acute non-recurrent maxillary sinusitis 03/18/2023   Arthralgia of left knee 03/08/2023   Primary osteoarthritis of right knee 08/14/2022   Primary insomnia 04/14/2021   Radial scar of left breast 11/04/2020   Female stress incontinence 01/21/2016   Benign essential HTN 12/18/2014   Mixed hyperlipidemia 10/24/2014    Allergies  Allergen Reactions   Penicillin V Potassium Rash    Past Surgical History:  Procedure Laterality Date   BREAST BIOPSY Right    neg   BREAST BIOPSY Left 04/01/2020   stereo, distortion, ribbon clip-COMPLEX SCLEROSING LESION.   BREAST BIOPSY Left 11/20/2020   Procedure: BREAST BIOPSY WITH NEEDLE LOCALIZATION;  Surgeon: Earline Mayotte, MD;  Location: [facility] ORS;  Service: General;  Laterality: Left;   BREAST EXCISIONAL BIOPSY Left 11/20/2020    excision-COMPLEX SCLEROSING LESION.   COLONOSCOPY  08/21/2008   normal   FRACTURE SURGERY Right    arm no hardware   TUBAL LIGATION      Social History   Tobacco Use   Smoking status: Never   Smokeless tobacco: Never   Tobacco comments:    smoking cessation materials not required  Vaping Use   Vaping status: Never Used  Substance Use Topics   Alcohol use: Yes    Alcohol/week: 2.0 standard drinks of alcohol    Types: 2 Glasses of wine per week   Drug use: Never     Medication list has been reviewed and updated.  No outpatient medications have been marked as taking for the 04/20/23 encounter (Orders Only) with Reubin Milan, MD.       04/07/2023   11:10 AM 03/23/2023   10:23 AM 02/16/2023    8:23 AM 02/12/2022    8:27 AM  GAD 7 : Generalized Anxiety Score  Nervous, Anxious, on Edge 0 0 0 0  Control/stop worrying 0 1 0 0  Worry too much - different things 0 0 1 0  Trouble relaxing 0 1 0 0  Restless 0 0 0 0  Easily annoyed or irritable 0 0 0 0  Afraid - awful  might happen 0 0 0 0  Total GAD 7 Score 0 2 1 0  Anxiety Difficulty Not difficult at all Not difficult at all Not difficult at all Not difficult at all       04/07/2023   11:10 AM 03/23/2023   10:23 AM 02/16/2023    8:23 AM  Depression screen PHQ 2/9  Decreased Interest 0 0 0  Down, Depressed, Hopeless 0 0 0  PHQ - 2 Score 0 0 0  Altered sleeping  2 1  Tired, decreased energy  1 0  Change in appetite  0 0  Feeling bad or failure about yourself   0 0  Trouble concentrating  0 0  Moving slowly or fidgety/restless  0 0  Suicidal thoughts  0 0  PHQ-9 Score  3 1  Difficult doing work/chores  Not difficult at all Not difficult at all    BP Readings from Last 3 Encounters:  04/07/23 (!) 154/88  03/23/23 (!) 160/92  03/18/23 (!) 140/80    Physical Exam  Wt Readings from Last 3 Encounters:  04/07/23 162 lb (73.5 kg)  03/23/23 161 lb 12.8 oz (73.4 kg)  03/18/23 163 lb 6.4 oz (74.1 kg)    There  were no vitals taken for this visit.  Assessment and Plan:  Problem List Items Addressed This Visit   None   No follow-ups on file.    Reubin Milan, MD [facility] [place]

## 2023-04-30 ENCOUNTER — Other Ambulatory Visit: Payer: Self-pay | Admitting: Internal Medicine

## 2023-04-30 DIAGNOSIS — I10 Essential (primary) hypertension: Secondary | ICD-10-CM

## 2023-04-30 NOTE — Telephone Encounter (Signed)
Requested Prescriptions  Pending Prescriptions Disp Refills   metoprolol succinate (TOPROL-XL) 50 MG 24 hr tablet [Pharmacy Med Name: METOPROLOL SUCC ER 50 MG TAB] 90 tablet 1    Sig: TAKE 1 TABLET BY MOUTH EVERY DAY     Cardiovascular:  Beta Blockers Failed - 04/30/2023  3:30 PM      Failed - Last BP in normal range    BP Readings from Last 1 Encounters:  04/20/23 (!) 146/94         Passed - Last Heart Rate in normal range    Pulse Readings from Last 1 Encounters:  04/20/23 83         Passed - Valid encounter within last 6 months    Recent Outpatient Visits           3 weeks ago Eustachian tube dysfunction, bilateral   [place] [facility] at [facility], Melton Alar, PA   1 month ago Non-recurrent acute serous otitis media of right ear   [place] [facility] at [facility], Nyoka Cowden, MD   1 month ago Acute non-recurrent maxillary sinusitis    [facility] at [facility], Ocie Bob, MD   1 month ago Arthralgia of left knee   [facility] at [facility], Ocie Bob, MD   2 months ago Annual physical exam   [facility] at [facility], Nyoka Cowden, MD       Future Appointments             In 3 months Judithann Graves, Nyoka Cowden, MD [facility] Medicine at [facility], [facility]

## 2023-05-05 ENCOUNTER — Encounter: Payer: Self-pay | Admitting: Internal Medicine

## 2023-05-05 NOTE — Telephone Encounter (Signed)
 Please review.  KP

## 2023-06-07 DIAGNOSIS — L820 Inflamed seborrheic keratosis: Secondary | ICD-10-CM | POA: Diagnosis not present

## 2023-06-07 DIAGNOSIS — D485 Neoplasm of uncertain behavior of skin: Secondary | ICD-10-CM | POA: Diagnosis not present

## 2023-06-07 DIAGNOSIS — D225 Melanocytic nevi of trunk: Secondary | ICD-10-CM | POA: Diagnosis not present

## 2023-06-07 DIAGNOSIS — L821 Other seborrheic keratosis: Secondary | ICD-10-CM | POA: Diagnosis not present

## 2023-06-07 DIAGNOSIS — D2271 Melanocytic nevi of right lower limb, including hip: Secondary | ICD-10-CM | POA: Diagnosis not present

## 2023-06-07 DIAGNOSIS — D2272 Melanocytic nevi of left lower limb, including hip: Secondary | ICD-10-CM | POA: Diagnosis not present

## 2023-06-07 DIAGNOSIS — D2261 Melanocytic nevi of right upper limb, including shoulder: Secondary | ICD-10-CM | POA: Diagnosis not present

## 2023-06-07 DIAGNOSIS — L570 Actinic keratosis: Secondary | ICD-10-CM | POA: Diagnosis not present

## 2023-06-07 DIAGNOSIS — Z08 Encounter for follow-up examination after completed treatment for malignant neoplasm: Secondary | ICD-10-CM | POA: Diagnosis not present

## 2023-06-07 DIAGNOSIS — D2262 Melanocytic nevi of left upper limb, including shoulder: Secondary | ICD-10-CM | POA: Diagnosis not present

## 2023-06-07 DIAGNOSIS — Z85828 Personal history of other malignant neoplasm of skin: Secondary | ICD-10-CM | POA: Diagnosis not present

## 2023-06-14 ENCOUNTER — Ambulatory Visit (INDEPENDENT_AMBULATORY_CARE_PROVIDER_SITE_OTHER): Admitting: Internal Medicine

## 2023-06-14 ENCOUNTER — Encounter: Payer: Self-pay | Admitting: Internal Medicine

## 2023-06-14 VITALS — BP 112/68 | HR 77 | Ht 64.0 in | Wt 162.0 lb

## 2023-06-14 DIAGNOSIS — M25562 Pain in left knee: Secondary | ICD-10-CM

## 2023-06-14 DIAGNOSIS — I10 Essential (primary) hypertension: Secondary | ICD-10-CM

## 2023-06-14 MED ORDER — PREDNISONE 10 MG PO TABS: ORAL_TABLET | ORAL | 0 refills | Status: AC

## 2023-06-14 NOTE — Assessment & Plan Note (Signed)
 Last visit stopped losartan and started Benicar hct Now BP are slightly low -  Will change to 1/2 Benicar hct daily; continue metoprolol

## 2023-06-14 NOTE — Progress Notes (Signed)
 Date:  06/14/2023   Name:  Heidi Powell   DOB:  April 19, 1950   MRN:  [id number]   Chief Complaint: Hypertension (Patient would like to discuss not having much energy since starting switching bp medication ) and Knee Pain (Patient would like prednisone, left knee pain, unable to walk much due to pain)  Hypertension This is a chronic problem. The problem has been gradually improving since onset. The problem is controlled. Pertinent negatives include no chest pain, headaches, palpitations or shortness of breath. Past treatments include angiotensin blockers, beta blockers and diuretics. There is no history of kidney disease, CAD/MI or CVA.  Knee Pain  There was no injury mechanism (but did a lot of walking over the weekend and was very painful last night).    Review of Systems  Constitutional:  Negative for chills, fatigue and fever.  Respiratory:  Negative for chest tightness and shortness of breath.   Cardiovascular:  Negative for chest pain and palpitations.  Musculoskeletal:  Positive for arthralgias and gait problem. Negative for joint swelling and myalgias.  Neurological:  Positive for light-headedness. Negative for dizziness and headaches.  Psychiatric/Behavioral:  Negative for dysphoric mood and sleep disturbance. The patient is not nervous/anxious.      Lab Results  Component Value Date   NA 143 02/16/2023   K 4.4 02/16/2023   CO2 19 (L) 02/16/2023   GLUCOSE 97 02/16/2023   BUN 17 02/16/2023   CREATININE 1.00 02/16/2023   CALCIUM 9.7 02/16/2023   EGFR 61 02/16/2023   GFRNONAA 47 (L) 10/25/2020   Lab Results  Component Value Date   CHOL 204 (H) 02/16/2023   HDL 52 02/16/2023   LDLCALC 123 (H) 02/16/2023   TRIG 162 (H) 02/16/2023   CHOLHDL 3.9 02/16/2023   Lab Results  Component Value Date   TSH 0.650 02/16/2023   Lab Results  Component Value Date   HGBA1C 5.6 02/12/2022   Lab Results  Component Value Date   WBC 6.6 02/16/2023   HGB 13.4 02/16/2023    HCT 42.1 02/16/2023   MCV 95 02/16/2023   PLT 253 02/16/2023   Lab Results  Component Value Date   ALT 21 02/16/2023   AST 22 02/16/2023   ALKPHOS 140 (H) 02/16/2023   BILITOT 0.3 02/16/2023   Lab Results  Component Value Date   VD25OH 39.9 11/06/2014     Patient Active Problem List   Diagnosis Date Noted   Acute non-recurrent maxillary sinusitis 03/18/2023   Arthralgia of left knee 03/08/2023   Primary osteoarthritis of right knee 08/14/2022   Primary insomnia 04/14/2021   Radial scar of left breast 11/04/2020   Female stress incontinence 01/21/2016   Benign essential HTN 12/18/2014   Mixed hyperlipidemia 10/24/2014    Allergies  Allergen Reactions   Penicillin V Potassium Rash    Past Surgical History:  Procedure Laterality Date   BREAST BIOPSY Right    neg   BREAST BIOPSY Left 04/01/2020   stereo, distortion, ribbon clip-COMPLEX SCLEROSING LESION.   BREAST BIOPSY Left 11/20/2020   Procedure: BREAST BIOPSY WITH NEEDLE LOCALIZATION;  Surgeon: Earline Mayotte, MD;  Location: [facility] ORS;  Service: General;  Laterality: Left;   BREAST EXCISIONAL BIOPSY Left 11/20/2020   excision-COMPLEX SCLEROSING LESION.   COLONOSCOPY  08/21/2008   normal   FRACTURE SURGERY Right    arm no hardware   TUBAL LIGATION      Social History   Tobacco Use   Smoking status: Never  Smokeless tobacco: Never   Tobacco comments:    smoking cessation materials not required  Vaping Use   Vaping status: Never Used  Substance Use Topics   Alcohol use: Yes    Alcohol/week: 2.0 standard drinks of alcohol    Types: 2 Glasses of wine per week   Drug use: Never     Medication list has been reviewed and updated.  Current Meds  Medication Sig   atorvastatin (LIPITOR) 10 MG tablet TAKE 1 TABLET BY MOUTH EVERY DAY   metoprolol succinate (TOPROL-XL) 50 MG 24 hr tablet TAKE 1 TABLET BY MOUTH EVERY DAY   Multiple Vitamin (MULTIVITAMIN) tablet Take 1 tablet by mouth daily.    olmesartan-hydrochlorothiazide (BENICAR HCT) 40-12.5 MG tablet Take 1 tablet by mouth daily.   predniSONE (DELTASONE) 10 MG tablet Take 6 tablets (60 mg total) by mouth daily with breakfast for 1 day, THEN 5 tablets (50 mg total) daily with breakfast for 1 day, THEN 4 tablets (40 mg total) daily with breakfast for 1 day, THEN 3 tablets (30 mg total) daily with breakfast for 1 day, THEN 2 tablets (20 mg total) daily with breakfast for 1 day, THEN 1 tablet (10 mg total) daily with breakfast for 1 day.       04/20/2023    9:58 AM 04/07/2023   11:10 AM 03/23/2023   10:23 AM 02/16/2023    8:23 AM  GAD 7 : Generalized Anxiety Score  Nervous, Anxious, on Edge 0 0 0 0  Control/stop worrying 0 0 1 0  Worry too much - different things 0 0 0 1  Trouble relaxing 0 0 1 0  Restless 0 0 0 0  Easily annoyed or irritable 0 0 0 0  Afraid - awful might happen 0 0 0 0  Total GAD 7 Score 0 0 2 1  Anxiety Difficulty Not difficult at all Not difficult at all Not difficult at all Not difficult at all       04/20/2023    9:58 AM 04/07/2023   11:10 AM 03/23/2023   10:23 AM  Depression screen PHQ 2/9  Decreased Interest 0 0 0  Down, Depressed, Hopeless 0 0 0  PHQ - 2 Score 0 0 0  Altered sleeping   2  Tired, decreased energy   1  Change in appetite   0  Feeling bad or failure about yourself    0  Trouble concentrating   0  Moving slowly or fidgety/restless   0  Suicidal thoughts   0  PHQ-9 Score   3  Difficult doing work/chores Not difficult at all  Not difficult at all    BP Readings from Last 3 Encounters:  06/14/23 112/68  04/20/23 (!) 146/94  04/07/23 (!) 154/88    Physical Exam Vitals and nursing note reviewed.  Constitutional:      General: She is not in acute distress.    Appearance: Normal appearance. She is well-developed.  HENT:     Head: Normocephalic and atraumatic.  Cardiovascular:     Rate and Rhythm: Normal rate and regular rhythm.  Pulmonary:     Effort: Pulmonary effort is  normal. No respiratory distress.     Breath sounds: No wheezing or rhonchi.  Musculoskeletal:     Left knee: No crepitus. Decreased range of motion. Tenderness present over the MCL.     Right lower leg: No edema.     Left lower leg: No edema.  Skin:    General: Skin is  warm and dry.     Findings: No rash.  Neurological:     General: No focal deficit present.     Mental Status: She is alert and oriented to person, place, and time.  Psychiatric:        Mood and Affect: Mood normal.        Behavior: Behavior normal.     Wt Readings from Last 3 Encounters:  06/14/23 162 lb (73.5 kg)  04/20/23 165 lb (74.8 kg)  04/07/23 162 lb (73.5 kg)    BP 112/68   Pulse 77   Ht 5\' 4"  (1.626 m)   Wt 162 lb (73.5 kg)   SpO2 97%   BMI 27.81 kg/m   Assessment and Plan:  Problem List Items Addressed This Visit       Unprioritized   Benign essential HTN - Primary (Chronic)   Last visit stopped losartan and started Benicar hct Now BP are slightly low -  Will change to 1/2 Benicar hct daily; continue metoprolol       Other Visit Diagnoses       Medial knee pain, left       had steroid injection on right with good results currently Advil and Aleve are not helping will try steroid taper   Relevant Medications   predniSONE (DELTASONE) 10 MG tablet       No follow-ups on file.    Reubin Milan, MD [facility] [place]

## 2023-06-14 NOTE — Patient Instructions (Signed)
 Take 1/2 olmesartan/hct daily; continue metoprolol

## 2023-07-06 ENCOUNTER — Ambulatory Visit (INDEPENDENT_AMBULATORY_CARE_PROVIDER_SITE_OTHER): Admitting: Family Medicine

## 2023-07-06 ENCOUNTER — Telehealth: Payer: Self-pay | Admitting: Family Medicine

## 2023-07-06 ENCOUNTER — Encounter: Payer: Self-pay | Admitting: Family Medicine

## 2023-07-06 VITALS — BP 134/66 | HR 77 | Ht 64.0 in | Wt 167.0 lb

## 2023-07-06 DIAGNOSIS — M1712 Unilateral primary osteoarthritis, left knee: Secondary | ICD-10-CM | POA: Diagnosis not present

## 2023-07-06 MED ORDER — PREDNISONE 20 MG PO TABS: 20.0000 mg | ORAL_TABLET | Freq: Two times a day (BID) | ORAL | 0 refills | Status: AC

## 2023-07-06 NOTE — Patient Instructions (Signed)
 Patient Plan  Left Knee Osteoarthritis:  1. Start taking prednisone  20 mg tablets twice daily with food for 6 days. 2. Apply Voltaren  gel to the affected knee 4 times a day while taking prednisone . 3. Contact our office if symptoms persist beyond 2 weeks to discuss the possibility of another cortisone injection. 4. Follow up as needed based on symptom improvement and treatment response.  Red Flags:  - If you experience increased swelling, severe pain, or any new symptoms, please contact your healthcare provider immediately.

## 2023-07-06 NOTE — Progress Notes (Signed)
     Primary Care / Sports Medicine Office Visit  Patient Information:  Patient ID: Heidi Powell, female DOB: Jul 19, 1950 Age: 73 y.o. MRN: [id number]   Heidi Powell is a pleasant 73 y.o. female presenting with the following:  Chief Complaint  Patient presents with   Knee Pain    Left knee pain x 1 week. Patient had some swelling and pain on the medial aspect of the left knee. She has some difficulty walking, going from sit to stand, and bearing weight. She has taken IBU and tylenol  arthritis which has helped some. She had injection back in December which helped a lot but she She would like to discuss other treatment options.    Vitals:   07/06/23 0849  BP: 134/66  Pulse: 77  SpO2: 98%   Vitals:   07/06/23 0849  Weight: 167 lb (75.8 kg)  Height: 5\' 4"  (1.626 m)   Body mass index is 28.67 kg/m.  No results found.   Independent interpretation of notes and tests performed by another provider:   None  Procedures performed:   None  Pertinent History, Exam, Impression, and Recommendations:   Problem List Items Addressed This Visit     Osteoarthritis of left knee - Primary   Roughly 1 month history of atraumatic left medial knee pain in the setting of known underlying osteoarthritis.  Denies any swelling, painful ambulation is reported.  She did dose ibuprofen  and Tylenol  arthritis throughout the day yesterday and has awakened feeling slightly improved.  Of note she received an intra-articular corticosteroid injection on 03/08/2023 and noted symptom resolution until this past month.  Examination with 1+ effusion, tenderness at the anterior aspect of the medial joint line primarily, secondarily to the patellar facets and lateral joint line, no laxity.  Left knee osteoarthritis Recurrence of symptoms roughly 3 months following corticosteroid injection most likely represents typical duration of cortisone symptom control.  Given her positive response from OTC  NSAIDs, we reviewed additional treatment strategies.  Additionally, given the recurrence of symptoms, we will seek authorization for viscosupplementation. -Start prednisone , take twice daily with food for 6 days -Utilize Voltaren  gel 4 times a day during prednisone  course -Contact our office if symptoms persist at the 2-week mark beyond to consider cortisone injection -Follow-up as needed      Relevant Medications   predniSONE  (DELTASONE ) 20 MG tablet     Orders & Medications Medications:  Meds ordered this encounter  Medications   predniSONE  (DELTASONE ) 20 MG tablet    Sig: Take 1 tablet (20 mg total) by mouth 2 (two) times daily with a meal for 6 days.    Dispense:  12 tablet    Refill:  0   No orders of the defined types were placed in this encounter.    Return if symptoms worsen or fail to improve.     Ma Saupe, MD, [facility]   Primary Care Sports Medicine Primary Care and Sports Medicine at [facility] [place]

## 2023-07-06 NOTE — Assessment & Plan Note (Signed)
 Roughly 1 month history of atraumatic left medial knee pain in the setting of known underlying osteoarthritis.  Denies any swelling, painful ambulation is reported.  She did dose ibuprofen  and Tylenol  arthritis throughout the day yesterday and has awakened feeling slightly improved.  Of note she received an intra-articular corticosteroid injection on 03/08/2023 and noted symptom resolution until this past month.  Examination with 1+ effusion, tenderness at the anterior aspect of the medial joint line primarily, secondarily to the patellar facets and lateral joint line, no laxity.  Left knee osteoarthritis Recurrence of symptoms roughly 3 months following corticosteroid injection most likely represents typical duration of cortisone symptom control.  Given her positive response from OTC NSAIDs, we reviewed additional treatment strategies.  Additionally, given the recurrence of symptoms, we will seek authorization for viscosupplementation. -Start prednisone , take twice daily with food for 6 days -Utilize Voltaren  gel 4 times a day during prednisone  course -Contact our office if symptoms persist at the 2-week mark beyond to consider cortisone injection -Follow-up as needed

## 2023-07-08 NOTE — Telephone Encounter (Signed)
 I have initiated benefits review for Monovisc.  JM

## 2023-07-21 ENCOUNTER — Encounter: Payer: Self-pay | Admitting: Family Medicine

## 2023-07-21 ENCOUNTER — Ambulatory Visit (INDEPENDENT_AMBULATORY_CARE_PROVIDER_SITE_OTHER): Admitting: Family Medicine

## 2023-07-21 VITALS — BP 140/80 | HR 86 | Ht 64.0 in

## 2023-07-21 DIAGNOSIS — M1711 Unilateral primary osteoarthritis, right knee: Secondary | ICD-10-CM

## 2023-07-21 NOTE — Progress Notes (Signed)
     Primary Care / Sports Medicine Office Visit  Patient Information:  Patient ID: Heidi Powell, female DOB: May 17, 1950 Age: 73 y.o. MRN: [id number]   Heidi Powell is a pleasant 73 y.o. female presenting with the following:  Chief Complaint  Patient presents with   Knee Pain    Patient presents today to get a cortisone injection in her left knee.    Vitals:   07/21/23 1021  BP: (!) 140/80  Pulse: 86  SpO2: 97%   Vitals:   07/21/23 1021  Height: 5\' 4"  (1.626 m)   Body mass index is 28.67 kg/m.  No results found.   Independent interpretation of notes and tests performed by another provider:   None  Procedures performed:   None  Pertinent History, Exam, Impression, and Recommendations:   Problem List Items Addressed This Visit     Primary osteoarthritis of right knee - Primary   History of Present Illness Heidi Powell is a 73 year old female who presents with knee pain and consideration for a cortisone shot.  She experiences persistent knee pain, typically rated as six or seven out of ten, recently improving to two out of ten. Prednisone  provided some relief previously, but she prefers to avoid it unless necessary. She has not taken arthritis strength Tylenol  in several days, indicating decreased pain severity.  She is planning a trip to the beach tomorrow and a [place] trip at the end of the month, requiring comfortable walking. She is concerned about managing knee pain during these trips. The first cortisone shot took about five days to provide relief.  Assessment and Plan Right knee osteoarthritis Chronic knee pain improved with prednisone , not fully resolved. Discussed at length cortisone injection timing and prophylactic prescriptions for anti-inflammatory and prednisone  for [facility] trip management. - Consider cortisone injection if pain increases, ideally 5-6 days before trip. - Provide emergency kit with prednisone  and  anti-inflammatory medication, call for Rx. - Avoid aggravating activities during beach trip. - Use MyChart for scheduling or medication needs next week. - If patient contact our office, please work in        Orders & Medications Medications: No orders of the defined types were placed in this encounter.  No orders of the defined types were placed in this encounter.    No follow-ups on file.     Ma Saupe, MD, [facility]   Primary Care Sports Medicine Primary Care and Sports Medicine at [facility]

## 2023-07-21 NOTE — Assessment & Plan Note (Signed)
 History of Present Illness Heidi Powell is a 73 year old female who presents with knee pain and consideration for a cortisone shot.  She experiences persistent knee pain, typically rated as six or seven out of ten, recently improving to two out of ten. Prednisone  provided some relief previously, but she prefers to avoid it unless necessary. She has not taken arthritis strength Tylenol  in several days, indicating decreased pain severity.  She is planning a trip to the beach tomorrow and a [place] trip at the end of the month, requiring comfortable walking. She is concerned about managing knee pain during these trips. The first cortisone shot took about five days to provide relief.  Assessment and Plan Right knee osteoarthritis Chronic knee pain improved with prednisone , not fully resolved. Discussed at length cortisone injection timing and prophylactic prescriptions for anti-inflammatory and prednisone  for [facility] trip management. - Consider cortisone injection if pain increases, ideally 5-6 days before trip. - Provide emergency kit with prednisone  and anti-inflammatory medication, call for Rx. - Avoid aggravating activities during beach trip. - Use MyChart for scheduling or medication needs next week. - If patient contact our office, please work in

## 2023-07-21 NOTE — Patient Instructions (Signed)
 Patient Plan  Right Knee Osteoarthritis:  1. If your knee pain worsens, consider scheduling a cortisone injection 5-6 days before your trip. 2. Prepare an "emergency kit" with prednisone  and anti-inflammatory medication. Call us  if you need a prescription. 3. Avoid activities that may aggravate your knee during your beach trip. 4. Use MyChart to schedule appointments or request medication next week, please let clinical staff know to check chart for my notes regarding expedited scheduling due to medically time-sensitive circumstances.  Red Flags:  - Contact your healthcare provider if you experience significant pain or swelling that does not improve.

## 2023-07-26 ENCOUNTER — Telehealth: Payer: Self-pay

## 2023-07-26 NOTE — Telephone Encounter (Signed)
 Copied from CRM [id number]. Topic: Clinical - Medication Prior Auth >> Jul 26, 2023 11:40 AM Star East wrote: Reason for CRM: update on prior auth for Monovisc which is not covered by insurance, there is an alternative that is covered called Euflexxa or another one called  Durolane- [phone]

## 2023-07-27 ENCOUNTER — Telehealth: Payer: Self-pay

## 2023-07-27 ENCOUNTER — Other Ambulatory Visit: Payer: Self-pay

## 2023-07-27 DIAGNOSIS — M1711 Unilateral primary osteoarthritis, right knee: Secondary | ICD-10-CM

## 2023-07-27 DIAGNOSIS — M1712 Unilateral primary osteoarthritis, left knee: Secondary | ICD-10-CM

## 2023-07-27 MED ORDER — SODIUM HYALURONATE 60 MG/3ML IX PRSY: 60.0000 mg | PREFILLED_SYRINGE | Freq: Once | INTRA_ARTICULAR | Status: DC

## 2023-07-27 MED ORDER — EUFLEXXA 20 MG/2ML IX SOSY: 2.0000 mL | PREFILLED_SYRINGE | INTRA_ARTICULAR | 0 refills | Status: DC

## 2023-07-27 MED ORDER — SODIUM HYALURONATE 60 MG/3ML IX PRSY
60.0000 mg | PREFILLED_SYRINGE | Freq: Once | INTRA_ARTICULAR | Status: DC
Start: 2023-07-27 — End: 2023-07-27

## 2023-07-27 NOTE — Telephone Encounter (Signed)
 I have placed a order for Euflexxa fore Left knee OA.

## 2023-07-27 NOTE — Telephone Encounter (Signed)
 Durolane injection was ordered in error. Was not administered.   JM

## 2023-07-28 ENCOUNTER — Telehealth: Payer: Self-pay

## 2023-07-28 ENCOUNTER — Encounter: Payer: Self-pay | Admitting: Family Medicine

## 2023-07-28 ENCOUNTER — Ambulatory Visit (INDEPENDENT_AMBULATORY_CARE_PROVIDER_SITE_OTHER): Admitting: Family Medicine

## 2023-07-28 ENCOUNTER — Other Ambulatory Visit (INDEPENDENT_AMBULATORY_CARE_PROVIDER_SITE_OTHER): Payer: Self-pay | Admitting: Radiology

## 2023-07-28 VITALS — BP 134/86 | HR 72 | Ht 64.0 in | Wt 167.0 lb

## 2023-07-28 DIAGNOSIS — M1712 Unilateral primary osteoarthritis, left knee: Secondary | ICD-10-CM

## 2023-07-28 MED ORDER — PREDNISONE 20 MG PO TABS: 40.0000 mg | ORAL_TABLET | Freq: Every day | ORAL | 0 refills | Status: AC

## 2023-07-28 MED ORDER — EUFLEXXA 20 MG/2ML IX SOSY: 2.0000 mL | PREFILLED_SYRINGE | INTRA_ARTICULAR | 0 refills | Status: DC

## 2023-07-28 MED ORDER — TRIAMCINOLONE ACETONIDE 40 MG/ML IJ SUSP
40.0000 mg | Freq: Once | INTRAMUSCULAR | Status: AC
  Administered 2023-07-28: 40 mg via INTRA_ARTICULAR

## 2023-07-28 NOTE — Patient Instructions (Signed)
 You have just been given a cortisone injection to reduce pain and inflammation. After the injection you may notice immediate relief of pain as a result of the Lidocaine . It is important to rest the area of the injection for 24 to 48 hours after the injection. There is a possibility of some temporary increased discomfort and swelling for up to 72 hours until the cortisone begins to work. If you do have pain, simply rest the joint and use ice. If you can tolerate over the counter medications, you can try Tylenol , Aleve, or Advil  for added relief per package instructions.   Patient Plan  1. Cortisone Injection    - Receive a cortisone injection under ultrasound guidance for knee pain relief.    - Rest and apply ice to the knee for two days after the injection.  2. Medications    - Take diclofenac  twice daily until the cortisone injection takes effect, then use as needed for pain. Take with food.    - If experiencing a severe flare-up, take prednisone  as prescribed for five days. Take with food and in the morning.    - Remember to bring both diclofenac  and prednisone  on your trip.  3. Future Treatment    - Consider a gel injection for long-term maintenance once acute symptoms have improved.  Red Flags: - If you experience increased pain, swelling, or any side effects from medications, contact your healthcare provider immediately.

## 2023-07-28 NOTE — Assessment & Plan Note (Signed)
 History of Present Illness Heidi Powell is a 73 year old female with osteoarthritis who presents with knee pain.  She experiences significant knee pain, particularly in the medial aspect of her left knee, exacerbated by recent weather changes. The pain is severe enough to prevent her from engaging in usual activities, such as walking with her daughter.  She has a history of osteoarthritis in the left knee and has previously undergone various non-injection treatments, including medications and bracing. A cortisone injection approximately three months ago provided relief for that duration. Currently, she is not taking any anti-inflammatory medications, although she has diclofenac  at home in both 50 mg and 75 mg doses.  She is planning a trip to [place] next week and is concerned about managing her knee pain during the trip. Her mother also experienced increased arthritis pain with weather changes.  Assessment and Plan Osteoarthritis of left knee Chronic osteoarthritis with recent exacerbation affecting daily activities. Cortisone injection planned for relief. Gel injection discussed for maintenance post-acute phase. Explained medication side effects and usage guidelines. - Administer cortisone injection under ultrasound guidance. - Advise rest and ice application for two days post-injection. - Prescribe diclofenac  twice daily until cortisone takes effect, then as needed. - Prescribe five-day course of prednisone  for severe flare-ups during travel. - Advise taking prednisone  with food and in the morning. - Instruct to bring diclofenac  and prednisone  on the trip. - Discuss gel injection as maintenance option once acute symptoms resolve.

## 2023-07-28 NOTE — Progress Notes (Signed)
     Primary Care / Sports Medicine Office Visit  Patient Information:  Patient ID: Heidi Powell, female DOB: [birth year] Age: 73 y.o. MRN: [id number]   Heidi Powell is a pleasant 73 y.o. female presenting with the following:  Chief Complaint  Patient presents with   Knee Pain    Left knee pain continues. She has been taking tylenol  and IBU but the pain is still there. She is requesting an injection today.     Vitals:   07/28/23 1025  BP: 134/86  Pulse: 72  SpO2: 98%   Vitals:   07/28/23 1025  Weight: 167 lb (75.8 kg)  Height: 5\' 4"  (1.626 m)   Body mass index is 28.67 kg/m.  No results found.   Independent interpretation of notes and tests performed by another provider:   None  Procedures performed:   Procedure:  Injection of left knee under ultrasound guidance. Ultrasound guidance utilized for anterolateral injection Samsung HS60 device utilized with permanent recording / reporting. Verbal informed consent obtained and verified. Skin prepped in a sterile fashion. Ethyl chloride for topical local analgesia.  Completed without difficulty and tolerated well. Medication: triamcinolone  acetonide 40 mg/mL suspension for injection 1 mL total and 2 mL lidocaine  1% without epinephrine  utilized for needle placement anesthetic Advised to contact for fevers/chills, erythema, induration, drainage, or persistent bleeding.   Pertinent History, Exam, Impression, and Recommendations:   Problem List Items Addressed This Visit     Osteoarthritis of left knee - Primary   Relevant Medications   predniSONE  (DELTASONE ) 20 MG tablet   Other Relevant Orders   US  LIMITED JOINT SPACE STRUCTURES LOW LEFT     Orders & Medications Medications:  Meds ordered this encounter  Medications   triamcinolone  acetonide (KENALOG -40) injection 40 mg   predniSONE  (DELTASONE ) 20 MG tablet    Sig: Take 2 tablets (40 mg total) by mouth daily for 5 days.    Dispense:  10 tablet     Refill:  0   Orders Placed This Encounter  Procedures   US  LIMITED JOINT SPACE STRUCTURES LOW LEFT     No follow-ups on file.     Ma Saupe, MD, [facility]   Primary Care Sports Medicine Primary Care and Sports Medicine at [facility]

## 2023-07-28 NOTE — Telephone Encounter (Signed)
 Spoke with Ceclia Cohens and informed me that we needed to send new script to [facility] who would then fill RX and sent to our location. Resent RX to [employer].   JM  Copied from CRM [id number]. Topic: Clinical - Medication Question >> Jul 28, 2023  2:43 PM Star East wrote: Reason for CRM: Betty Bruckner with [facility] - please send an updated prescription for Sodium Hyaluronate, Viscosup, (EUFLEXXA) 20 MG/2ML SOSY- [phone]

## 2023-07-29 ENCOUNTER — Telehealth: Payer: Self-pay

## 2023-07-29 NOTE — Telephone Encounter (Signed)
 Spoke with debra and informed her this will be for Bil knees. They are working on RX now and will ship to patient house.  JM   Copied from CRM [id number]. Topic: Clinical - Prescription Issue >> Jul 28, 2023  5:09 PM Phil Braun wrote: Reason for CRM:   Sodium Hyaluronate, Viscosup, (EUFLEXXA) 20 MG/2ML [facility] [phone]   Pharmacist needs to know which knee of injection will be going in. Please advise.

## 2023-07-30 ENCOUNTER — Ambulatory Visit: Admitting: Internal Medicine

## 2023-08-04 ENCOUNTER — Ambulatory Visit (INDEPENDENT_AMBULATORY_CARE_PROVIDER_SITE_OTHER): Admitting: Internal Medicine

## 2023-08-04 ENCOUNTER — Encounter: Payer: Self-pay | Admitting: Internal Medicine

## 2023-08-04 ENCOUNTER — Ambulatory Visit: Payer: Self-pay | Admitting: Internal Medicine

## 2023-08-04 VITALS — BP 124/78 | HR 61 | Ht 64.0 in | Wt 166.4 lb

## 2023-08-04 DIAGNOSIS — I10 Essential (primary) hypertension: Secondary | ICD-10-CM

## 2023-08-04 NOTE — Progress Notes (Signed)
 Date:  08/04/2023   Name:  Heidi Powell   DOB:  1951/02/22   MRN:  [id number]   Chief Complaint: Hypertension  Hypertension This is a chronic problem. Pertinent negatives include no chest pain, headaches, palpitations or shortness of breath. Past treatments include angiotensin blockers, diuretics and beta blockers. The current treatment provides significant improvement. There are no compliance problems.     Review of Systems  Constitutional:  Negative for fatigue and unexpected weight change.  HENT:  Negative for trouble swallowing.   Eyes:  Negative for visual disturbance.  Respiratory:  Negative for cough, chest tightness, shortness of breath and wheezing.   Cardiovascular:  Negative for chest pain, palpitations and leg swelling.  Gastrointestinal:  Negative for abdominal pain, constipation and diarrhea.  Musculoskeletal:  Negative for arthralgias and myalgias.  Neurological:  Negative for dizziness, weakness, light-headedness and headaches.  Psychiatric/Behavioral:  Negative for dysphoric mood and sleep disturbance. The patient is not nervous/anxious.      Lab Results  Component Value Date   NA 143 02/16/2023   K 4.4 02/16/2023   CO2 19 (L) 02/16/2023   GLUCOSE 97 02/16/2023   BUN 17 02/16/2023   CREATININE 1.00 02/16/2023   CALCIUM  9.7 02/16/2023   EGFR 61 02/16/2023   GFRNONAA 47 (L) 10/25/2020   Lab Results  Component Value Date   CHOL 204 (H) 02/16/2023   HDL 52 02/16/2023   LDLCALC 123 (H) 02/16/2023   TRIG 162 (H) 02/16/2023   CHOLHDL 3.9 02/16/2023   Lab Results  Component Value Date   TSH 0.650 02/16/2023   Lab Results  Component Value Date   HGBA1C 5.6 02/12/2022   Lab Results  Component Value Date   WBC 6.6 02/16/2023   HGB 13.4 02/16/2023   HCT 42.1 02/16/2023   MCV 95 02/16/2023   PLT 253 02/16/2023   Lab Results  Component Value Date   ALT 21 02/16/2023   AST 22 02/16/2023   ALKPHOS 140 (H) 02/16/2023   BILITOT 0.3 02/16/2023    Lab Results  Component Value Date   VD25OH 39.9 11/06/2014     Patient Active Problem List   Diagnosis Date Noted   Acute non-recurrent maxillary sinusitis 03/18/2023   Osteoarthritis of left knee 03/08/2023   Primary osteoarthritis of right knee 08/14/2022   Primary insomnia 04/14/2021   Radial scar of left breast 11/04/2020   Female stress incontinence 01/21/2016   Benign essential HTN 12/18/2014   Mixed hyperlipidemia 10/24/2014    Allergies  Allergen Reactions   Penicillin V Potassium Rash    Past Surgical History:  Procedure Laterality Date   BREAST BIOPSY Right    neg   BREAST BIOPSY Left 04/01/2020   stereo, distortion, ribbon clip-COMPLEX SCLEROSING LESION.   BREAST BIOPSY Left 11/20/2020   Procedure: BREAST BIOPSY WITH NEEDLE LOCALIZATION;  Surgeon: Marshall Skeeter, MD;  Location: [facility] ORS;  Service: General;  Laterality: Left;   BREAST EXCISIONAL BIOPSY Left 11/20/2020   excision-COMPLEX SCLEROSING LESION.   COLONOSCOPY  08/21/2008   normal   FRACTURE SURGERY Right    arm no hardware   TUBAL LIGATION      Social History   Tobacco Use   Smoking status: Never   Smokeless tobacco: Never   Tobacco comments:    smoking cessation materials not required  Vaping Use   Vaping status: Never Used  Substance Use Topics   Alcohol use: Yes    Alcohol/week: 2.0 standard drinks of alcohol  Types: 2 Glasses of wine per week   Drug use: Never     Medication list has been reviewed and updated.  Current Meds  Medication Sig   atorvastatin  (LIPITOR) 10 MG tablet TAKE 1 TABLET BY MOUTH EVERY DAY   metoprolol  succinate (TOPROL -XL) 50 MG 24 hr tablet TAKE 1 TABLET BY MOUTH EVERY DAY   Multiple Vitamin (MULTIVITAMIN) tablet Take 1 tablet by mouth daily.   olmesartan -hydrochlorothiazide (BENICAR  HCT) 40-12.5 MG tablet Take 0.5 tablets by mouth daily.   Sodium Hyaluronate, Viscosup, (EUFLEXXA) 20 MG/2ML SOSY Inject 20 mg into the articular space once a week. x3  weeks.  Dx Knee osteoarthritis, patient will bring syringes to me and I will inject   [DISCONTINUED] olmesartan -hydrochlorothiazide (BENICAR  HCT) 40-12.5 MG tablet Take 1 tablet by mouth daily.       08/04/2023   11:00 AM 07/06/2023    8:56 AM 04/20/2023    9:58 AM 04/07/2023   11:10 AM  GAD 7 : Generalized Anxiety Score  Nervous, Anxious, on Edge 0 0 0 0  Control/stop worrying 0 0 0 0  Worry too much - different things 0 0 0 0  Trouble relaxing 0 0 0 0  Restless 0 0 0 0  Easily annoyed or irritable 0 0 0 0  Afraid - awful might happen 0 0 0 0  Total GAD 7 Score 0 0 0 0  Anxiety Difficulty Not difficult at all Not difficult at all Not difficult at all Not difficult at all       08/04/2023   10:59 AM 07/06/2023    8:55 AM 04/20/2023    9:58 AM  Depression screen PHQ 2/9  Decreased Interest 0 0 0  Down, Depressed, Hopeless 0 0 0  PHQ - 2 Score 0 0 0  Altered sleeping 1 2   Tired, decreased energy 0 1   Change in appetite 0 0   Feeling bad or failure about yourself  0 0   Trouble concentrating 0 0   Moving slowly or fidgety/restless 0 0   Suicidal thoughts 0 0   PHQ-9 Score 1 3   Difficult doing work/chores Not difficult at all Not difficult at all Not difficult at all    BP Readings from Last 3 Encounters:  08/04/23 124/78  07/28/23 134/86  07/21/23 (!) 140/80    Physical Exam Vitals and nursing note reviewed.  Constitutional:      General: She is not in acute distress.    Appearance: She is well-developed.  HENT:     Head: Normocephalic and atraumatic.  Cardiovascular:     Rate and Rhythm: Normal rate and regular rhythm.  Pulmonary:     Effort: Pulmonary effort is normal. No respiratory distress.     Breath sounds: No wheezing or rhonchi.  Musculoskeletal:     Cervical back: Normal range of motion.     Right lower leg: No edema.     Left lower leg: No edema.  Skin:    General: Skin is warm and dry.     Findings: No rash.  Neurological:     General: No focal  deficit present.     Mental Status: She is alert and oriented to person, place, and time.  Psychiatric:        Mood and Affect: Mood normal.        Behavior: Behavior normal.     Wt Readings from Last 3 Encounters:  08/04/23 166 lb 6 oz (75.5 kg)  07/28/23 167  lb (75.8 kg)  07/06/23 167 lb (75.8 kg)    BP 124/78   Pulse 61   Ht 5\' 4"  (1.626 m)   Wt 166 lb 6 oz (75.5 kg)   SpO2 97%   BMI 28.56 kg/m   Assessment and Plan:  Problem List Items Addressed This Visit       Unprioritized   Benign essential HTN - Primary (Chronic)   Blood pressure is well controlled.  Current medications are Benicar  hct 1/2 tab daily and metoprolol . Will continue same regimen along with efforts to limit dietary sodium.       Relevant Medications   olmesartan -hydrochlorothiazide (BENICAR  HCT) 40-12.5 MG tablet    Return in about 6 months (around 02/04/2024) for CPX after 02/16/24.    Sheron Dixons, MD [facility] Primary Care and Sports Medicine [place]

## 2023-08-04 NOTE — Assessment & Plan Note (Signed)
 Blood pressure is well controlled.  Current medications are Benicar  hct 1/2 tab daily and metoprolol . Will continue same regimen along with efforts to limit dietary sodium.

## 2023-08-06 ENCOUNTER — Telehealth: Payer: Self-pay

## 2023-08-06 NOTE — Telephone Encounter (Signed)
 Spoke with patient and informed her [facility] is trying to get in touch with her to mail her Euflexxa. She said she was not even sure if she wanted that. She is still trying to see how well the cortisone is going to work.  JM  Copied from CRM [id number]. Topic: Clinical - Prescription Issue >> Aug 05, 2023  4:39 PM Lajean Pike wrote: Reason for CRM:  [facility], [phone] wanted the clinic to be aware that they've been having a hard time reaching the patient and are awaiting a response from the patient.

## 2023-08-18 ENCOUNTER — Telehealth: Payer: Self-pay

## 2023-08-18 NOTE — Telephone Encounter (Signed)
 Spoke with Gilmer Lady and informed him yes it is only supposed to be for left knee. M17.12 dx code.  JM  Copied from CRM [id number]. Topic: Clinical - Prescription Issue >> Aug 18, 2023  3:33 PM Hobson Luna F wrote: Reason for CRM: [facility] is calling in for clarification on the injection site. They received a prescription for Euflexxa for the left knee and then they received a verbal prescription that said it was for both knees. When they reached out to the patient they said it should only be the left knee. The pharmacy is requesting clarification for what knee it is

## 2023-08-19 ENCOUNTER — Encounter: Payer: Self-pay | Admitting: Family Medicine

## 2023-08-19 ENCOUNTER — Other Ambulatory Visit: Payer: Self-pay

## 2023-08-19 DIAGNOSIS — M1712 Unilateral primary osteoarthritis, left knee: Secondary | ICD-10-CM

## 2023-08-19 DIAGNOSIS — M25562 Pain in left knee: Secondary | ICD-10-CM

## 2023-08-24 ENCOUNTER — Telehealth: Payer: Self-pay

## 2023-08-24 NOTE — Telephone Encounter (Signed)
 Patient out of town will call back when she gets back into town and advise.  JM   Copied from CRM [id number]. Topic: Clinical - Prescription Issue >> Aug 23, 2023  4:18 PM Heidi Powell wrote: Reason for CRM: [facility] has been attempting to reach patient to get their consent to ship the Sodium Hyaluronate, Viscosup, (EUFLEXXA) 20 MG/2ML SOSY to the office. Confirmed that [facility] has same phone number we have. [facility] wanted to know if office could attempt to contact patient and have her call them at [phone] so consent can be completed and medication can be shipped to office.

## 2023-08-25 ENCOUNTER — Ambulatory Visit
Admission: RE | Admit: 2023-08-25 | Discharge: 2023-08-25 | Disposition: A | Discharge status: Home / Self Care | Source: Ambulatory Visit | Attending: Family Medicine | Admitting: Family Medicine

## 2023-08-25 DIAGNOSIS — M25562 Pain in left knee: Secondary | ICD-10-CM | POA: Diagnosis not present

## 2023-08-25 DIAGNOSIS — S83242A Other tear of medial meniscus, current injury, left knee, initial encounter: Secondary | ICD-10-CM | POA: Diagnosis not present

## 2023-08-25 DIAGNOSIS — M2242 Chondromalacia patellae, left knee: Secondary | ICD-10-CM | POA: Diagnosis not present

## 2023-08-25 DIAGNOSIS — M1712 Unilateral primary osteoarthritis, left knee: Secondary | ICD-10-CM | POA: Diagnosis not present

## 2023-08-25 DIAGNOSIS — M25462 Effusion, left knee: Secondary | ICD-10-CM | POA: Diagnosis not present

## 2023-08-25 DIAGNOSIS — R609 Edema, unspecified: Secondary | ICD-10-CM | POA: Diagnosis not present

## 2023-08-26 NOTE — Telephone Encounter (Signed)
 Please Review   Heidi Powell

## 2023-09-05 ENCOUNTER — Ambulatory Visit: Payer: Self-pay | Admitting: Family Medicine

## 2023-09-06 NOTE — Telephone Encounter (Signed)
 PT response.  JM

## 2023-09-08 ENCOUNTER — Encounter: Payer: Self-pay | Admitting: Family Medicine

## 2023-09-08 ENCOUNTER — Ambulatory Visit (INDEPENDENT_AMBULATORY_CARE_PROVIDER_SITE_OTHER): Admitting: Family Medicine

## 2023-09-08 VITALS — BP 96/66 | HR 75 | Ht 64.0 in | Wt 166.0 lb

## 2023-09-08 DIAGNOSIS — M1712 Unilateral primary osteoarthritis, left knee: Secondary | ICD-10-CM

## 2023-09-08 DIAGNOSIS — M2392 Unspecified internal derangement of left knee: Secondary | ICD-10-CM | POA: Insufficient documentation

## 2023-09-08 NOTE — Patient Instructions (Signed)
 Patient Plan for Post-Visit Guidance  1. Torn Medial Meniscus:    - Large tear at the root of the posterior horn with medial extrusion.    - Refer to an orthopedic surgeon for evaluation and discussion of surgical management options.  2. Loose Body in Knee Joint:    - Presence of a loose body in the anterior joint space, likely causing intermittent sharp pain.    - Refer to an orthopedic surgeon for evaluation and potential surgical management.  3. Severe Chondromalacia:    - Severe chondromalacia with full-thickness cartilage wear contributing to symptoms.    - Discuss surgical options with an orthopedic surgeon.  4. Osteoarthritis of the Knee:    - Chronic osteoarthritis with severe chondromalacia and full-thickness cartilage wear contributing to pain.    - Refer to an orthopedic surgeon for evaluation and discussion of surgical options.  Relevant Orders: - Ambulatory referral to Orthopedic Surgery for comprehensive evaluation and management of knee conditions.

## 2023-09-08 NOTE — Assessment & Plan Note (Signed)
 History of Present Illness Ellee Wawrzyniak is a 73 year old female who presents with persistent knee pain and MRI findings.  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 with areas of full-thickness cartilage loss  Prior treatments and response - History of cortisone injections and prednisone  without significant relief - Not currently taking pain medications unless pain becomes severe  Results RADIOLOGY Knee MRI: ACL and PCL intact, medial collateral ligament and lateral collateral ligament intact, lateral meniscus unremarkable, large tear to the root of the posterior horn of the medial meniscus with the body extruded medially, myxoid degeneration of the body, loose body in the anterior joint space measuring up to 12 mm, severe chondromalacia along the inner aspect with areas of full-thickness cartilage loss, mild to moderate chondromalacia in the patellofemoral compartment with areas of full-thickness cartilage loss at the upper pole, small joint effusion (08/25/2023).  Assessment and Plan Torn medial meniscus Large tear at the root of the posterior horn with medial extrusion. - Refer to orthopedic surgeon for evaluation and surgical management options.  Loose body in knee joint Loose body in anterior joint space, most likely etiology for intermittent sharp pain. - Refer to orthopedic surgeon  for evaluation and surgical management.  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 - Discuss surgical options with orthopedic surgeon  Osteoarthritis of knee Chronic osteoarthritis with severe chondromalacia and full thickness cartilage wear contributing to pain. - Refer to orthopedic surgeon for evaluation and discussion of surgical options.

## 2023-09-08 NOTE — Progress Notes (Signed)
 Primary Care / Sports Medicine Office Visit  Patient Information:  Patient ID: Heidi Powell, female DOB: 14-Sep-1950 Age: 73 y.o. MRN: [id number]   Heidi Powell is a pleasant 73 y.o. female presenting with the following:  Chief Complaint  Patient presents with   Knee Pain    MRI review of left knee     Vitals:   09/08/23 1109  BP: 96/66  Pulse: 75  SpO2: 94%   Vitals:   09/08/23 1109  Weight: 166 lb (75.3 kg)  Height: 5' 4 (1.626 m)   Body mass index is 28.49 kg/m.     Independent interpretation of notes and tests performed by another provider:   RADIOLOGY Knee MRI: ACL and PCL intact, medial collateral ligament and lateral collateral ligament intact, lateral meniscus unremarkable, large tear to the root of the posterior horn of the medial meniscus with the body extruded medially, myxoid degeneration of the body, loose body in the anterior joint space measuring up to 12 mm, severe chondromalacia along the inner aspect with areas of full-thickness cartilage loss, mild to moderate chondromalacia in the patellofemoral compartment with areas of full-thickness cartilage loss at the upper pole, small joint effusion (08/25/2023).  Procedures performed:   None  Pertinent History, Exam, Impression, and Recommendations:   Problem List Items Addressed This Visit     Internal derangement of left knee - Primary   History of Present Illness Heidi Powell is a 73 year old female who presents with persistent knee pain and MRI findings.  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 aspect of the knee with areas of full-thickness cartilage loss  Prior treatments and response - History of cortisone injections and prednisone  without significant relief - Not currently taking pain medications unless pain becomes severe  Results RADIOLOGY Knee MRI: ACL and PCL intact, medial collateral ligament and lateral collateral ligament intact, lateral meniscus unremarkable, large tear to the root of the posterior horn of the medial meniscus with the body extruded medially, myxoid degeneration of the body, loose body in the anterior joint space measuring up to 12 mm, severe chondromalacia along the inner aspect with areas of full-thickness cartilage loss, mild to moderate chondromalacia in the patellofemoral compartment with areas of full-thickness cartilage loss at the upper pole, small joint effusion (08/25/2023).  Assessment and Plan Torn medial meniscus Large tear at the root of the posterior horn with medial extrusion. - Refer to orthopedic surgeon for evaluation and surgical management options.  Loose body in knee joint Loose body in anterior joint space, most likely etiology for intermittent sharp pain. - Refer to orthopedic surgeon for evaluation and surgical management.  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 - Discuss surgical options with orthopedic surgeon  Osteoarthritis of knee Chronic osteoarthritis with severe chondromalacia and full thickness cartilage wear contributing to pain. - Refer to orthopedic surgeon for  evaluation and discussion of surgical options.      Relevant Orders   Ambulatory referral to Orthopedic Surgery   Osteoarthritis of left knee   Relevant Orders   Ambulatory referral  to Orthopedic Surgery   I provided a total time of 33 minutes including both face-to-face and non-face-to-face time on 09/08/2023 inclusive of time utilized for medical chart review, information gathering, care coordination with staff, and documentation completion.   Orders & Medications Medications: No orders of the defined types were placed in this encounter.  Orders Placed This Encounter  Procedures   Ambulatory referral to Orthopedic Surgery     No follow-ups on file.     Selinda JINNY Ku, MD, [facility]   Primary Care Sports Medicine Primary Care and Sports Medicine at [facility]

## 2023-09-20 DIAGNOSIS — S83221A Peripheral tear of medial meniscus, current injury, right knee, initial encounter: Secondary | ICD-10-CM | POA: Diagnosis not present

## 2023-09-22 ENCOUNTER — Encounter: Payer: Self-pay | Admitting: Family Medicine

## 2023-09-22 ENCOUNTER — Other Ambulatory Visit: Payer: Self-pay | Admitting: Family Medicine

## 2023-09-22 MED ORDER — MELOXICAM 15 MG PO TABS: 15.0000 mg | ORAL_TABLET | Freq: Every day | ORAL | 0 refills | Status: DC | PRN

## 2023-09-22 NOTE — Telephone Encounter (Signed)
 Please review and advise patient.   JM

## 2023-10-05 ENCOUNTER — Other Ambulatory Visit: Payer: Self-pay | Admitting: Internal Medicine

## 2023-10-05 DIAGNOSIS — I10 Essential (primary) hypertension: Secondary | ICD-10-CM

## 2023-10-06 NOTE — Telephone Encounter (Signed)
 Requested medication (s) are due for refill today: yes  Requested medication (s) are on the active medication list: yes,historical  Last refill:  08/04/23  Future visit scheduled: yes  Notes to clinic:  Unable to refill per protocol, historical medication. Routing for review.     Requested Prescriptions  Pending Prescriptions Disp Refills   olmesartan -hydrochlorothiazide (BENICAR  HCT) 40-12.5 MG tablet [Pharmacy Med Name: OLMESARTAN -HCTZ 40-12.5 MG TAB] 90 tablet 1    Sig: TAKE 1 TABLET BY MOUTH EVERY DAY     Cardiovascular: ARB + Diuretic Combos Failed - 10/06/2023  9:25 AM      Failed - K in normal range and within 180 days    Potassium  Date Value Ref Range Status  02/16/2023 4.4 3.5 - 5.2 mmol/L Final  06/23/2014 3.7 mmol/L Final    Comment:    3.5-5.1 NOTE: New Reference Range  05/15/14          Failed - Na in normal range and within 180 days    Sodium  Date Value Ref Range Status  02/16/2023 143 134 - 144 mmol/L Final  06/23/2014 138 mmol/L Final    Comment:    135-145 NOTE: New Reference Range  05/15/14          Failed - Cr in normal range and within 180 days    Creatinine  Date Value Ref Range Status  06/23/2014 0.93 mg/dL Final    Comment:    9.55-8.99 NOTE: New Reference Range  05/15/14    Creatinine, Ser  Date Value Ref Range Status  02/16/2023 1.00 0.57 - 1.00 mg/dL Final         Failed - eGFR is 10 or above and within 180 days    EGFR (African American)  Date Value Ref Range Status  06/23/2014 >60  Final   GFR calc Af Amer  Date Value Ref Range Status  02/05/2020 69 >59 mL/min/1.73 Final    Comment:    **In accordance with recommendations from the NKF-ASN Task force,**   [facility] is in the process of updating its eGFR calculation to the   2021 CKD-EPI creatinine equation that estimates kidney function   without a race variable.    EGFR (Non-African Amer.)  Date Value Ref Range Status  06/23/2014 >60  Final    Comment:    eGFR  values <24mL/min/1.73 m2 may be an indication of chronic kidney disease (CKD). Calculated eGFR is useful in patients with stable renal function. The eGFR calculation will not be reliable in acutely ill patients when serum creatinine is changing rapidly. It is not useful in patients on dialysis. The eGFR calculation may not be applicable to patients at the low and high extremes of body sizes, pregnant women, and vegetarians.    GFR, Estimated  Date Value Ref Range Status  10/25/2020 47 (L) >60 mL/min Final    Comment:    (NOTE) Calculated using the CKD-EPI Creatinine Equation (2021)    eGFR  Date Value Ref Range Status  02/16/2023 61 >59 mL/min/1.73 Final         Passed - Patient is not pregnant      Passed - Last BP in normal range    BP Readings from Last 1 Encounters:  09/08/23 96/66         Passed - Valid encounter within last 6 months    Recent Outpatient Visits           4 weeks ago Internal derangement of left knee   [facility], Selinda PARAS, MD   2 months ago Benign essential HTN   [facility] Primary Care & Sports Medicine at [facility], Leita DEL, MD   2 months ago Primary osteoarthritis of left knee   [facility] at [facility], Selinda PARAS, MD   2 months ago Primary osteoarthritis of right knee   [facility] Primary [facility] Medicine at [facility], Selinda PARAS, MD   3 months ago Primary osteoarthritis of left knee   [facility] Primary Care & Sports Medicine at [facility] [place], Selinda PARAS, MD       Future Appointments             In 4 months Justus, Leita DEL, MD [facility] Primary Care & Sports Medicine at [facility], [facility]

## 2023-10-13 ENCOUNTER — Other Ambulatory Visit: Payer: Self-pay | Admitting: Internal Medicine

## 2023-10-13 DIAGNOSIS — I10 Essential (primary) hypertension: Secondary | ICD-10-CM

## 2023-10-14 NOTE — Telephone Encounter (Signed)
 Requested Prescriptions  Pending Prescriptions Disp Refills   metoprolol  succinate (TOPROL -XL) 50 MG 24 hr tablet [Pharmacy Med Name: METOPROLOL  SUCC ER 50 MG TAB] 90 tablet 0    Sig: TAKE 1 TABLET BY MOUTH EVERY DAY     Cardiovascular:  Beta Blockers Passed - 10/14/2023  3:44 PM      Passed - Last BP in normal range    BP Readings from Last 1 Encounters:  09/08/23 96/66         Passed - Last Heart Rate in normal range    Pulse Readings from Last 1 Encounters:  09/08/23 75         Passed - Valid encounter within last 6 months    Recent Outpatient Visits           1 month ago Internal derangement of left knee   [place] [facility] at [facility], Selinda PARAS, MD   2 months ago Benign essential HTN   [place] [facility] Care & Sports Medicine at [facility], Leita DEL, MD   2 months ago Primary osteoarthritis of left knee   [facility] Primary [facility] Medicine at [facility], Selinda PARAS, MD   2 months ago Primary osteoarthritis of right knee   [facility] Primary Care & Sports Medicine at [facility], Selinda PARAS, MD   3 months ago Primary osteoarthritis of left knee   [facility] Primary Care & Sports Medicine at [facility], Selinda PARAS, MD       Future Appointments             In 4 months Justus, Leita DEL, MD [facility] Primary Care & Sports Medicine at [facility], [facility]

## 2023-10-15 ENCOUNTER — Emergency Department

## 2023-10-15 ENCOUNTER — Other Ambulatory Visit: Payer: Self-pay

## 2023-10-15 ENCOUNTER — Emergency Department
Admission: EM | Admit: 2023-10-15 | Discharge: 2023-10-15 | Disposition: A | Discharge status: Home / Self Care | Attending: Emergency Medicine | Admitting: Emergency Medicine

## 2023-10-15 DIAGNOSIS — R079 Chest pain, unspecified: Secondary | ICD-10-CM | POA: Diagnosis not present

## 2023-10-15 DIAGNOSIS — R202 Paresthesia of skin: Secondary | ICD-10-CM | POA: Diagnosis not present

## 2023-10-15 DIAGNOSIS — R0789 Other chest pain: Secondary | ICD-10-CM | POA: Insufficient documentation

## 2023-10-15 DIAGNOSIS — I10 Essential (primary) hypertension: Secondary | ICD-10-CM | POA: Diagnosis not present

## 2023-10-15 LAB — BASIC METABOLIC PANEL WITH GFR
Anion gap: 12 (ref 5–15)
BUN: 25 mg/dL — ABNORMAL HIGH (ref 8–23)
CO2: 20 mmol/L — ABNORMAL LOW (ref 22–32)
Calcium: 9.2 mg/dL (ref 8.9–10.3)
Chloride: 105 mmol/L (ref 98–111)
Creatinine, Ser: 1.49 mg/dL — ABNORMAL HIGH (ref 0.44–1.00)
GFR, Estimated: 37 mL/min — ABNORMAL LOW (ref >60)
Glucose, Bld: 135 mg/dL — ABNORMAL HIGH (ref 70–99)
Potassium: 4 mmol/L (ref 3.5–5.1)
Sodium: 137 mmol/L (ref 135–145)

## 2023-10-15 LAB — CBC
HCT: 36 % (ref 36.0–46.0)
Hemoglobin: 12 g/dL (ref 12.0–15.0)
MCH: 32 pg (ref 26.0–34.0)
MCHC: 33.3 g/dL (ref 30.0–36.0)
MCV: 96 fL (ref 80.0–100.0)
Platelets: 234 K/uL (ref 150–400)
RBC: 3.75 MIL/uL — ABNORMAL LOW (ref 3.87–5.11)
RDW: 12.7 % (ref 11.5–15.5)
WBC: 8.4 K/uL (ref 4.0–10.5)
nRBC: 0 % (ref 0.0–0.2)

## 2023-10-15 LAB — TROPONIN I (HIGH SENSITIVITY)
Troponin I (High Sensitivity): 3 ng/L (ref <18)
Troponin I (High Sensitivity): 7 ng/L (ref <18)

## 2023-10-15 NOTE — ED Provider Notes (Signed)
 [facility] Provider Note    Event Date/Time   First MD Initiated Contact with Patient 10/15/23 1927     (approximate)   History   Chest Pain   HPI  Heidi Powell is a 73 y.o. female with history of hyperlipidemia, hypertension who presents with complaints of chest pressure, now much improved, she reports symptoms started about 1 hour prior to arrival.  She reports she was doing some shopping, not overly significant exertion, has just returned from the beach.  No shortness of breath, no pleurisy, no calf pain or swelling.  No fevers chills or cough     Physical Exam   Triage Vital Signs: ED Triage Vitals  Encounter Vitals Group     BP 10/15/23 1717 103/80     Girls Systolic BP Percentile --      Girls Diastolic BP Percentile --      Boys Systolic BP Percentile --      Boys Diastolic BP Percentile --      Pulse Rate 10/15/23 1717 62     Resp 10/15/23 1717 16     Temp 10/15/23 1717 98.8 F (37.1 C)     Temp Source 10/15/23 1717 Oral     SpO2 10/15/23 1717 98 %     Weight --      Height 10/15/23 1718 1.626 m (5' 4)     Head Circumference --      Peak Flow --      Pain Score 10/15/23 1718 0     Pain Loc --      Pain Education --      Exclude from Growth Chart --     Most recent vital signs: Vitals:   10/15/23 1717 10/15/23 2003  BP: 103/80 (!) 160/72  Pulse: 62 66  Resp: 16 16  Temp: 98.8 F (37.1 C)   SpO2: 98% 99%     General: Awake, no distress.  CV:  Good peripheral perfusion.  Regular rate and rhythm Resp:  Normal effort.  Clear to auscultation bilaterally Abd:  No distention.  Other:  No calf pain or swelling   ED Results / Procedures / Treatments   Labs (all labs ordered are listed, but only abnormal results are displayed) Labs Reviewed  BASIC METABOLIC PANEL WITH GFR - Abnormal; Notable for the following components:      Result Value   CO2 20 (*)    Glucose, Bld 135 (*)    BUN 25 (*)    Creatinine, Ser  1.49 (*)    GFR, Estimated 37 (*)    All other components within normal limits  CBC - Abnormal; Notable for the following components:   RBC 3.75 (*)    All other components within normal limits  TROPONIN I (HIGH SENSITIVITY)  TROPONIN I (HIGH SENSITIVITY)     EKG  ED ECG REPORT I, Lamar Price, the attending physician, personally viewed and interpreted this ECG.  Date: 10/15/2023  Rhythm: normal sinus rhythm QRS Axis: normal Intervals: normal ST/T Wave abnormalities: normal Narrative Interpretation: no evidence of acute ischemia    RADIOLOGY Chest x-ray viewed interpret by me, no acute abnormality    PROCEDURES:  Critical Care performed:   Procedures   MEDICATIONS ORDERED IN ED: Medications - No data to display   IMPRESSION / MDM / ASSESSMENT AND PLAN / ED COURSE  I reviewed the triage vital signs and the nursing notes. Patient's presentation is most consistent with acute presentation with potential threat  to life or bodily function.  Patient presents with chest discomfort as detailed above, differential includes ACS, angina, esophagitis, musculoskeletal pain  Overall she is well-appearing and in no acute distress here, and is feeling improved.  EKG is reassuring, initial troponin is normal  Chest x-ray without acute abnormality.  Pending delta troponin   Second troponin is normal, the patient is entirely asymptomatic at this time.  We discussed admission however she would prefer discharge and outpatient follow-up with cardiology, she agrees to return if worsening symptoms or if symptoms return.        FINAL CLINICAL IMPRESSION(S) / ED DIAGNOSES   Final diagnoses:  Atypical chest pain     Rx / DC Orders   ED Discharge Orders          Ordered    Ambulatory referral to Cardiology       Comments: If you have not heard from the Cardiology office within the next 72 hours please call [phone].   10/15/23 2113             Note:  This  document was prepared using Dragon voice recognition software and may include unintentional dictation errors.   Arlander Charleston, MD 10/15/23 [id number]

## 2023-10-15 NOTE — ED Triage Notes (Signed)
 Pt to ed from home via [facility] for CP. Sudden onset of CP and pressure x 1 hour. Pt also has some tingling in her hands. Pt just traveled today from [place]. Vitals for EMS : 12lead WNL 156/82 54 HR  99% RA  BGL 118

## 2023-10-18 ENCOUNTER — Encounter: Payer: Self-pay | Admitting: Internal Medicine

## 2023-10-27 DIAGNOSIS — M1712 Unilateral primary osteoarthritis, left knee: Secondary | ICD-10-CM | POA: Diagnosis not present

## 2023-10-27 DIAGNOSIS — M25562 Pain in left knee: Secondary | ICD-10-CM | POA: Diagnosis not present

## 2023-10-28 ENCOUNTER — Other Ambulatory Visit: Payer: Self-pay | Admitting: Nurse Practitioner

## 2023-10-28 DIAGNOSIS — E782 Mixed hyperlipidemia: Secondary | ICD-10-CM

## 2023-10-28 DIAGNOSIS — I10 Essential (primary) hypertension: Secondary | ICD-10-CM | POA: Diagnosis not present

## 2023-10-28 DIAGNOSIS — R55 Syncope and collapse: Secondary | ICD-10-CM | POA: Diagnosis not present

## 2023-10-28 DIAGNOSIS — R0789 Other chest pain: Secondary | ICD-10-CM

## 2023-10-28 DIAGNOSIS — Z8249 Family history of ischemic heart disease and other diseases of the circulatory system: Secondary | ICD-10-CM

## 2023-10-28 DIAGNOSIS — Z01818 Encounter for other preprocedural examination: Secondary | ICD-10-CM

## 2023-11-04 ENCOUNTER — Ambulatory Visit (INDEPENDENT_AMBULATORY_CARE_PROVIDER_SITE_OTHER): Admitting: Internal Medicine

## 2023-11-04 ENCOUNTER — Encounter: Payer: Self-pay | Admitting: Internal Medicine

## 2023-11-04 VITALS — BP 136/82 | HR 77 | Ht 64.0 in | Wt 163.0 lb

## 2023-11-04 DIAGNOSIS — M1712 Unilateral primary osteoarthritis, left knee: Secondary | ICD-10-CM

## 2023-11-04 DIAGNOSIS — I10 Essential (primary) hypertension: Secondary | ICD-10-CM | POA: Diagnosis not present

## 2023-11-04 NOTE — Progress Notes (Signed)
 Date:  11/04/2023   Name:  Heidi Powell   DOB:  1950/05/14   MRN:  [id number]   Chief Complaint: Surgical Clearance and Anxiety (Patient is explaining she does not feel well. She said her BP has been higher at home. Today was good. She had to go to the ER several weeks ago due to chest pain/pressure/anxiousness. She is very anxious for tomorrow because she has to have a stress test with cardiology.)  Hypertension This is a chronic problem. The problem has been gradually improving since onset. Associated symptoms include chest pain. Pertinent negatives include no palpitations, peripheral edema or shortness of breath. Past treatments include angiotensin blockers and diuretics. The current treatment provides moderate improvement. There are no compliance problems.     Review of Systems  Respiratory:  Negative for shortness of breath.   Cardiovascular:  Positive for chest pain. Negative for palpitations.     Lab Results  Component Value Date   NA 137 10/15/2023   K 4.0 10/15/2023   CO2 20 (L) 10/15/2023   GLUCOSE 135 (H) 10/15/2023   BUN 25 (H) 10/15/2023   CREATININE 1.49 (H) 10/15/2023   CALCIUM  9.2 10/15/2023   EGFR 61 02/16/2023   GFRNONAA 37 (L) 10/15/2023   Lab Results  Component Value Date   CHOL 204 (H) 02/16/2023   HDL 52 02/16/2023   LDLCALC 123 (H) 02/16/2023   TRIG 162 (H) 02/16/2023   CHOLHDL 3.9 02/16/2023   Lab Results  Component Value Date   TSH 0.650 02/16/2023   Lab Results  Component Value Date   HGBA1C 5.6 02/12/2022   Lab Results  Component Value Date   WBC 8.4 10/15/2023   HGB 12.0 10/15/2023   HCT 36.0 10/15/2023   MCV 96.0 10/15/2023   PLT 234 10/15/2023   Lab Results  Component Value Date   ALT 21 02/16/2023   AST 22 02/16/2023   ALKPHOS 140 (H) 02/16/2023   BILITOT 0.3 02/16/2023   Lab Results  Component Value Date   VD25OH 39.9 11/06/2014     Patient Active Problem List   Diagnosis Date Noted   Internal derangement  of left knee 09/08/2023   Osteoarthritis of left knee 03/08/2023   Primary osteoarthritis of right knee 08/14/2022   Primary insomnia 04/14/2021   Radial scar of left breast 11/04/2020   Female stress incontinence 01/21/2016   Benign essential HTN 12/18/2014   Mixed hyperlipidemia 10/24/2014    Allergies  Allergen Reactions   Penicillin V Potassium Rash    Past Surgical History:  Procedure Laterality Date   BREAST BIOPSY Right    neg   BREAST BIOPSY Left 04/01/2020   stereo, distortion, ribbon clip-COMPLEX SCLEROSING LESION.   BREAST BIOPSY Left 11/20/2020   Procedure: BREAST BIOPSY WITH NEEDLE LOCALIZATION;  Surgeon: Dessa Reyes ORN, MD;  Location: [facility] ORS;  Service: General;  Laterality: Left;   BREAST EXCISIONAL BIOPSY Left 11/20/2020   excision-COMPLEX SCLEROSING LESION.   COLONOSCOPY  08/21/2008   normal   FRACTURE SURGERY Right    arm no hardware   TUBAL LIGATION      Social History   Tobacco Use   Smoking status: Never   Smokeless tobacco: Never   Tobacco comments:    smoking cessation materials not required  Vaping Use   Vaping status: Never Used  Substance Use Topics   Alcohol use: Yes    Alcohol/week: 2.0 standard drinks of alcohol    Types: 2 Glasses of wine per week  Drug use: Never     Medication list has been reviewed and updated.  Current Meds  Medication Sig   atorvastatin  (LIPITOR) 10 MG tablet TAKE 1 TABLET BY MOUTH EVERY DAY   metoprolol  succinate (TOPROL -XL) 50 MG 24 hr tablet TAKE 1 TABLET BY MOUTH EVERY DAY   Multiple Vitamin (MULTIVITAMIN) tablet Take 1 tablet by mouth daily.   olmesartan -hydrochlorothiazide (BENICAR  HCT) 40-12.5 MG tablet TAKE 1 TABLET BY MOUTH EVERY DAY       11/04/2023   10:04 AM 08/04/2023   11:00 AM 07/06/2023    8:56 AM 04/20/2023    9:58 AM  GAD 7 : Generalized Anxiety Score  Nervous, Anxious, on Edge 2 0 0 0  Control/stop worrying 2 0 0 0  Worry too much - different things 2 0 0 0  Trouble relaxing 0  0 0 0  Restless 0 0 0 0  Easily annoyed or irritable 0 0 0 0  Afraid - awful might happen 0 0 0 0  Total GAD 7 Score 6 0 0 0  Anxiety Difficulty Not difficult at all Not difficult at all Not difficult at all Not difficult at all       11/04/2023   10:04 AM 08/04/2023   10:59 AM 07/06/2023    8:55 AM  Depression screen PHQ 2/9  Decreased Interest 0 0 0  Down, Depressed, Hopeless 0 0 0  PHQ - 2 Score 0 0 0  Altered sleeping 0 1 2  Tired, decreased energy 3 0 1  Change in appetite 3 0 0  Feeling bad or failure about yourself  0 0 0  Trouble concentrating 0 0 0  Moving slowly or fidgety/restless 0 0 0  Suicidal thoughts 0 0 0  PHQ-9 Score 6 1 3   Difficult doing work/chores Not difficult at all Not difficult at all Not difficult at all    BP Readings from Last 3 Encounters:  11/04/23 136/82  10/15/23 (!) 160/72  09/08/23 96/66    Physical Exam Vitals and nursing note reviewed.  Constitutional:      General: She is not in acute distress.    Appearance: Normal appearance. She is well-developed.  HENT:     Head: Normocephalic and atraumatic.  Cardiovascular:     Rate and Rhythm: Normal rate and regular rhythm.     Heart sounds: No murmur heard. Pulmonary:     Effort: Pulmonary effort is normal. No respiratory distress.     Breath sounds: No wheezing or rhonchi.  Musculoskeletal:     Cervical back: Normal range of motion.  Skin:    General: Skin is warm and dry.     Findings: No rash.  Neurological:     Mental Status: She is alert and oriented to person, place, and time.  Psychiatric:        Mood and Affect: Mood normal.        Behavior: Behavior normal.     Wt Readings from Last 3 Encounters:  11/04/23 163 lb (73.9 kg)  09/08/23 166 lb (75.3 kg)  08/04/23 166 lb 6 oz (75.5 kg)    BP 136/82   Pulse 77   Ht 5' 4 (1.626 m)   Wt 163 lb (73.9 kg)   SpO2 98%   BMI 27.98 kg/m   Assessment and Plan:  Problem List Items Addressed This Visit        Unprioritized   Benign essential HTN - Primary (Chronic)   Blood pressure is improving on Toprol   50 mg and Olmesartan  40/12.5 daily.  BP is improved since increasing olmesartan  to a whole pill. Myocardial perfusion testing scheduled for tomorrow. No medication side effects noted. Plan to continue current medications.       Osteoarthritis of left knee   Planning TKA  in the near future once cleared by Cardiology. I will sign off on clearance form and fax.      Surgical clearance form will be completed. No follow-ups on file.    Leita HILARIO Adie, MD [facility] Primary Care and Sports Medicine [place]

## 2023-11-04 NOTE — Assessment & Plan Note (Addendum)
 Planning TKA  in the near future once cleared by Cardiology. I will sign off on clearance form and fax.

## 2023-11-04 NOTE — Assessment & Plan Note (Addendum)
 Blood pressure is improving on Toprol  50 mg and Olmesartan  40/12.5 daily.  BP is improved since increasing olmesartan  to a whole pill. Myocardial perfusion testing scheduled for tomorrow. No medication side effects noted. Plan to continue current medications.

## 2023-11-05 ENCOUNTER — Emergency Department
Admit: 2023-11-05 | Discharge: 2023-11-05 | Disposition: A | Discharge status: Home / Self Care | Attending: Nurse Practitioner | Admitting: Nurse Practitioner

## 2023-11-05 ENCOUNTER — Other Ambulatory Visit: Payer: Self-pay

## 2023-11-05 ENCOUNTER — Emergency Department
Admission: EM | Admit: 2023-11-05 | Discharge: 2023-11-05 | Disposition: A | Discharge status: Home / Self Care | Attending: Emergency Medicine | Admitting: Emergency Medicine

## 2023-11-05 ENCOUNTER — Ambulatory Visit
Admission: RE | Admit: 2023-11-05 | Discharge: 2023-11-05 | Disposition: A | Discharge status: Home / Self Care | Source: Ambulatory Visit | Attending: Nurse Practitioner | Admitting: Nurse Practitioner

## 2023-11-05 ENCOUNTER — Encounter: Payer: Self-pay | Admitting: Emergency Medicine

## 2023-11-05 DIAGNOSIS — Z8249 Family history of ischemic heart disease and other diseases of the circulatory system: Secondary | ICD-10-CM | POA: Diagnosis not present

## 2023-11-05 DIAGNOSIS — R0789 Other chest pain: Secondary | ICD-10-CM | POA: Insufficient documentation

## 2023-11-05 DIAGNOSIS — R55 Syncope and collapse: Secondary | ICD-10-CM | POA: Diagnosis not present

## 2023-11-05 DIAGNOSIS — E782 Mixed hyperlipidemia: Secondary | ICD-10-CM | POA: Insufficient documentation

## 2023-11-05 DIAGNOSIS — I10 Essential (primary) hypertension: Secondary | ICD-10-CM | POA: Insufficient documentation

## 2023-11-05 DIAGNOSIS — Z01818 Encounter for other preprocedural examination: Secondary | ICD-10-CM | POA: Insufficient documentation

## 2023-11-05 LAB — NM MYOCAR MULTI W/SPECT W/WALL MOTION / EF
Base ST Depression (mm): 0 mm
Estimated workload: 1
Exercise duration (min): 1 min
Exercise duration (sec): 0 s
MPHR: 149 {beats}/min
Nuc Stress EF: 62 %
Peak HR: 105 {beats}/min
Percent HR: 70 %
Rest HR: 81 {beats}/min
Rest Nuclear Isotope Dose: 10.3 mCi
SDS: 0
SRS: 0
SSS: 0
ST Depression (mm): 0 mm
Stress Nuclear Isotope Dose: 32.9 mCi
TID: 1.11

## 2023-11-05 LAB — COMPREHENSIVE METABOLIC PANEL WITH GFR
ALT: 16 U/L (ref 0–44)
AST: 20 U/L (ref 15–41)
Albumin: 4 g/dL (ref 3.5–5.0)
Alkaline Phosphatase: 90 U/L (ref 38–126)
Anion gap: 11 (ref 5–15)
BUN: 20 mg/dL (ref 8–23)
CO2: 23 mmol/L (ref 22–32)
Calcium: 9.6 mg/dL (ref 8.9–10.3)
Chloride: 100 mmol/L (ref 98–111)
Creatinine, Ser: 1.34 mg/dL — ABNORMAL HIGH (ref 0.44–1.00)
GFR, Estimated: 42 mL/min — ABNORMAL LOW (ref >60)
Glucose, Bld: 157 mg/dL — ABNORMAL HIGH (ref 70–99)
Potassium: 4.2 mmol/L (ref 3.5–5.1)
Sodium: 134 mmol/L — ABNORMAL LOW (ref 135–145)
Total Bilirubin: 0.9 mg/dL (ref 0.0–1.2)
Total Protein: 7.5 g/dL (ref 6.5–8.1)

## 2023-11-05 LAB — CBC
HCT: 39 % (ref 36.0–46.0)
Hemoglobin: 13.1 g/dL (ref 12.0–15.0)
MCH: 31.4 pg (ref 26.0–34.0)
MCHC: 33.6 g/dL (ref 30.0–36.0)
MCV: 93.5 fL (ref 80.0–100.0)
Platelets: 298 K/uL (ref 150–400)
RBC: 4.17 MIL/uL (ref 3.87–5.11)
RDW: 12.1 % (ref 11.5–15.5)
WBC: 9.5 K/uL (ref 4.0–10.5)
nRBC: 0 % (ref 0.0–0.2)

## 2023-11-05 LAB — GLUCOSE, CAPILLARY: Glucose-Capillary: 156 mg/dL — ABNORMAL HIGH (ref 70–99)

## 2023-11-05 LAB — TROPONIN I (HIGH SENSITIVITY)
Troponin I (High Sensitivity): 4 ng/L (ref <18)
Troponin I (High Sensitivity): 4 ng/L (ref <18)

## 2023-11-05 MED ORDER — REGADENOSON 0.4 MG/5ML IV SOLN
0.4000 mg | Freq: Once | INTRAVENOUS | Status: AC
  Administered 2023-11-05: 0.4 mg via INTRAVENOUS

## 2023-11-05 MED ORDER — TECHNETIUM TC 99M TETROFOSMIN IV KIT
10.0000 | PACK | Freq: Once | INTRAVENOUS | Status: AC | PRN
  Administered 2023-11-05: 10.26 via INTRAVENOUS

## 2023-11-05 MED ORDER — SODIUM CHLORIDE 0.9 % IV BOLUS
1000.0000 mL | Freq: Once | INTRAVENOUS | Status: AC
  Administered 2023-11-05: 1000 mL via INTRAVENOUS

## 2023-11-05 MED ORDER — TECHNETIUM TC 99M TETROFOSMIN IV KIT
32.8600 | PACK | Freq: Once | INTRAVENOUS | Status: AC | PRN
  Administered 2023-11-05: 32.86 via INTRAVENOUS

## 2023-11-05 NOTE — ED Triage Notes (Signed)
 Pt to ED after rapid response was called on pt in outpatient nuclear med. Pt was having IV inserted and had a syncopal episode. When code team responded, pt was alert but slow to respond and was diaphoretic. Pt was c/o feeling dizzy. Pts initial blood pressure was 76/47, pts CBG 157. While attempting to take blood pressure on other arm, pt had another syncopal episode with decreased respirations, code blue was called on pt. After pt was move from chair to bed, pt became responsive, no chest compressions were preformed.   On arrival to ED pt is A & O. Pt states that she was here today to have a stress test for clearance for knee surgery. Pt denies chest pain at this time. Pt reports dizziness is better now that she is laying down. Pt is in NAD at this time.

## 2023-11-05 NOTE — Discharge Instructions (Signed)
 Please proceed directly to nuclear medicine for your stress test.  Return to the emergency department for any further episodes of passing out any chest pain or any other symptom concerning to yourself.

## 2023-11-05 NOTE — Significant Event (Addendum)
 Responded to rapid response call to nuclear med. Pt became unresponisive after saline injected suspected vasovagal. On arrival pt was awake alert oriented maintaining airway. While assessing pt BP 76/47, CBG 156. Pt then became unresponsive, no palpable pulse, no spontaneous respirations. Moving pt over to bed,pt regained pulse and started breathing and was responsive. Requested I contact her daughter. Called pts daughter listed in [facility]. Explained what had happened but that pt seemed stable alert oriented. Daughter said she was on the way here. Pt was immediately taken to ED room 24. MD and staff present.

## 2023-11-05 NOTE — ED Provider Notes (Signed)
 [facility]    Event Date/Time   First MD Initiated Contact with Patient 11/05/23 [phone]     (approximate)  History   Chief Complaint: Loss of Consciousness  HPI  Markella Dao is a 73 y.o. female with a past medical history of hypertension, hyperlipidemia, arthritis, presents to the emergency department as a rapid response from nuclear medicine where she was scheduled to have a stress test today.  According to the patient and report patient is attempting to get cardiology clearance for her left knee replacement.  Patient was scheduled for a stress test this morning, she was in nuclear medicine getting an IV placed when the patient began feeling lightheaded and briefly lost consciousness.  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  Per nursing report patient had a brief LOC and they were concerned given her decreased respiratory rate so they brought her to the emergency department.  Upon arrival to the emergency department in a stretcher patient is awake alert oriented able to give a good history denies any symptoms at this time.  Patient states this has happened to her previously with IV attempts especially when she is not eating or drinking.  Physical Exam   Triage Vital Signs: ED Triage Vitals  Encounter Vitals Group     BP      Girls Systolic BP Percentile      Girls Diastolic BP Percentile      Boys Systolic BP Percentile      Boys Diastolic BP Percentile      Pulse      Resp      Temp      Temp src      SpO2      Weight      Height      Head Circumference      Peak Flow      Pain Score      Pain Loc      Pain Education      Exclude from Growth Chart     Most recent vital signs: There were no vitals filed for this visit.  General: Awake, no distress.  CV:  Good peripheral perfusion.  Regular  rate and rhythm  Resp:  Normal effort.  Equal breath sounds bilaterally.  Abd:  No distention.  Soft, nontender.  No rebound or guarding.  ED Results / Procedures / Treatments   EKG  EKG viewed and interpreted by myself shows a sinus rhythm at 63 bpm with a narrow QRS, normal axis, normal intervals, no concerning ST changes.  MEDICATIONS ORDERED IN ED: Medications  sodium chloride  0.9 % bolus 1,000 mL (has no administration in time range)     IMPRESSION / MDM / ASSESSMENT AND PLAN / ED COURSE  I reviewed the triage vital signs and the nursing notes.  Patient's presentation is most consistent with acute presentation with potential threat to life or bodily function.  Patient presents to the emergency department after a brief LOC during an IV attempt.  History of vasovagal events in the past per patient.  We will check labs we will IV hydrate (IV was ultimately able to be placed when the event occurred).  Will obtain an EKG and continue to closely monitor.  Patient's workup is reassuring with a normal CBC, normal chemistry, troponin of 4.  Patient's  blood pressure is 120/73 she has no symptoms currently.  Patient states this is a very longstanding issue for her.  States she has passed out multiple times with IV attempts in fact she told the person at nuclear medicine that she was likely going to pass out when they put the IV in.  Patient states that she has been extremely anxious recently for the stress test to get it over with.  She is adamant that she would still like it performed today if at all possible.  I called nuclear medicine they are still willing to do the stress test.  Patient strongly wishes to have it done as she already has the IV in place and states if she has to come back on another day a similar event will likely occur.  I believe it would be reasonable given her strong history of vagal events with likely vagal event and negative workup to get the patient back to nuclear  medicine to have the stress test performed so the patient can move on with her orthopedic surgery.  Patient agreeable to plan of care.  FINAL CLINICAL IMPRESSION(S) / ED DIAGNOSES   Syncope   Note:  This document was prepared using Dragon voice recognition software and may include unintentional dictation errors.   Dorothyann Drivers, MD 11/05/23 1047

## 2023-11-05 NOTE — Progress Notes (Signed)
   11/05/23 0830  Spiritual Encounters  Type of Visit Initial  Care provided to: Patient  Conversation partners present during encounter Nurse;Physician  Referral source Code page  Reason for visit Code  OnCall Visit Yes  Spiritual Framework  Presenting Themes Coping tools  Interventions  Spiritual Care Interventions Made Established relationship of care and support;Compassionate presence;Normalization of emotions  Intervention Outcomes  Outcomes Connection to spiritual care;Reduced anxiety   Chaplain responded to a rapid response in nuclear medicine where pt was being prepared to have a stress test. Pt was taken to ED for some tests and chaplain stayed at pt's bedside until her daughter, Damien, could be reached. Pt stated she felt anxious about the stress test and chaplain encouraged her to continue to take some deep breaths and relax.

## 2023-11-05 NOTE — ED Notes (Addendum)
 Pt going to be discharged from ED per EDP and pt will proceed directly to nuclear medicine for her stress test that she was getting prior to arriving in the ED. Per EDP, we will leave pt's IV access in for use at nuclear medicine.

## 2023-11-05 NOTE — ED Notes (Signed)
Pt's family members at bedside at this time.

## 2023-11-05 NOTE — ED Notes (Signed)
 Pt picked up by Nuc Med. IV in place.

## 2023-11-05 NOTE — ED Notes (Addendum)
 Bed alarm on, fall band applied, and pt has shoes on. Pt CAOx4, normal in color, and breathing normally.

## 2023-11-08 ENCOUNTER — Encounter: Payer: Self-pay | Admitting: Internal Medicine

## 2023-11-09 ENCOUNTER — Ambulatory Visit (INDEPENDENT_AMBULATORY_CARE_PROVIDER_SITE_OTHER): Admitting: Internal Medicine

## 2023-11-09 ENCOUNTER — Encounter: Payer: Self-pay | Admitting: Internal Medicine

## 2023-11-09 VITALS — BP 136/80 | HR 95 | Ht 64.0 in | Wt 163.0 lb

## 2023-11-09 DIAGNOSIS — I10 Essential (primary) hypertension: Secondary | ICD-10-CM | POA: Diagnosis not present

## 2023-11-09 DIAGNOSIS — F5101 Primary insomnia: Secondary | ICD-10-CM | POA: Diagnosis not present

## 2023-11-09 MED ORDER — DAYVIGO 5 MG PO TABS: 1.0000 | ORAL_TABLET | Freq: Every evening | ORAL | 0 refills | Status: DC | PRN

## 2023-11-09 MED ORDER — OLMESARTAN MEDOXOMIL 20 MG PO TABS: 20.0000 mg | ORAL_TABLET | Freq: Every day | ORAL | 0 refills | Status: DC

## 2023-11-09 NOTE — Assessment & Plan Note (Addendum)
 BP has been labile - up and down drastically at home. She cut the olmesartan  back due to low readings but then high again.  Yesterday BP 110 systolic and felt lightheaded.  Several hours later BP systolic 170.  She has a large component of anxiety as well. Recent labs at ED for syncope related to IV placement - low sodium and elevated CR. Stop olmesartan  hct. Begin olmesartan  20 mg in AM and continue Toprol  in PM Supplement intake with Boost/Ensure/Premier protein.  Aggressive hydration. Check BP routinely every AM and record. Follow up in 3 weeks.

## 2023-11-09 NOTE — Progress Notes (Signed)
 Date:  11/09/2023   Name:  Heidi Powell   DOB:  May 16, 1950   MRN:  [id number]   Chief Complaint: Hypertension  Hypertension This is a chronic problem. The problem is controlled. Pertinent negatives include no chest pain, headaches, palpitations or shortness of breath. Past treatments include angiotensin blockers and diuretics.  Insomnia Primary symptoms: sleep disturbance, frequent awakening.   The onset quality is undetermined. The problem occurs nightly. The problem has been rapidly worsening since onset. The symptoms are aggravated by anxiety (and worry over BP and upcoming TKA).  She has not felt well recently.  BP yesterday was low in the AM after doing light yard work.  She hydrated and rested then went to dinner at her daughter's house.  She felt poorly there - nausea and generally unwell.  BP was very high 179 systolic.  She went home and took another benicar  hct 20 mg.  She felt cold and shaky and slept only about 2 hours, worried about pressure.  This am she feels okay - took benicar  20 mg again. Overall she feels she is hydrating fairly well but not eating much due to decreased appetite. Labs from ED last Friday reviewed.  Low Na and elevated CR.  Of note, her Myocardial perfusion test done last Friday was WNL.    Review of Systems  Constitutional:  Positive for appetite change and chills. Negative for fatigue and unexpected weight change.  HENT:  Negative for trouble swallowing.   Eyes:  Negative for visual disturbance.  Respiratory:  Negative for cough, chest tightness, shortness of breath and wheezing.   Cardiovascular:  Negative for chest pain, palpitations and leg swelling.  Gastrointestinal:  Negative for abdominal pain, constipation and diarrhea.  Musculoskeletal:  Positive for arthralgias and gait problem. Negative for myalgias.  Neurological:  Positive for light-headedness. Negative for dizziness, weakness and headaches.  Psychiatric/Behavioral:  Positive for  sleep disturbance. Negative for dysphoric mood. The patient is nervous/anxious and has insomnia.      Lab Results  Component Value Date   NA 134 (L) 11/05/2023   K 4.2 11/05/2023   CO2 23 11/05/2023   GLUCOSE 157 (H) 11/05/2023   BUN 20 11/05/2023   CREATININE 1.34 (H) 11/05/2023   CALCIUM  9.6 11/05/2023   EGFR 61 02/16/2023   GFRNONAA 42 (L) 11/05/2023   Lab Results  Component Value Date   CHOL 204 (H) 02/16/2023   HDL 52 02/16/2023   LDLCALC 123 (H) 02/16/2023   TRIG 162 (H) 02/16/2023   CHOLHDL 3.9 02/16/2023   Lab Results  Component Value Date   TSH 0.650 02/16/2023   Lab Results  Component Value Date   HGBA1C 5.6 02/12/2022   Lab Results  Component Value Date   WBC 9.5 11/05/2023   HGB 13.1 11/05/2023   HCT 39.0 11/05/2023   MCV 93.5 11/05/2023   PLT 298 11/05/2023   Lab Results  Component Value Date   ALT 16 11/05/2023   AST 20 11/05/2023   ALKPHOS 90 11/05/2023   BILITOT 0.9 11/05/2023   Lab Results  Component Value Date   VD25OH 39.9 11/06/2014     Patient Active Problem List   Diagnosis Date Noted   Internal derangement of left knee 09/08/2023   Osteoarthritis of left knee 03/08/2023   Primary osteoarthritis of right knee 08/14/2022   Primary insomnia 04/14/2021   Radial scar of left breast 11/04/2020   Female stress incontinence 01/21/2016   Benign essential HTN 12/18/2014   Mixed  hyperlipidemia 10/24/2014    Allergies  Allergen Reactions   Penicillin V Potassium Rash    Past Surgical History:  Procedure Laterality Date   BREAST BIOPSY Right    neg   BREAST BIOPSY Left 04/01/2020   stereo, distortion, ribbon clip-COMPLEX SCLEROSING LESION.   BREAST BIOPSY Left 11/20/2020   Procedure: BREAST BIOPSY WITH NEEDLE LOCALIZATION;  Surgeon: Dessa Reyes ORN, MD;  Location: [facility] ORS;  Service: General;  Laterality: Left;   BREAST EXCISIONAL BIOPSY Left 11/20/2020   excision-COMPLEX SCLEROSING LESION.   COLONOSCOPY  08/21/2008   normal    FRACTURE SURGERY Right    arm no hardware   TUBAL LIGATION      Social History   Tobacco Use   Smoking status: Never   Smokeless tobacco: Never   Tobacco comments:    smoking cessation materials not required  Vaping Use   Vaping status: Never Used  Substance Use Topics   Alcohol use: Yes    Alcohol/week: 2.0 standard drinks of alcohol    Types: 2 Glasses of wine per week   Drug use: Never     Medication list has been reviewed and updated.  Current Meds  Medication Sig   atorvastatin  (LIPITOR) 10 MG tablet TAKE 1 TABLET BY MOUTH EVERY DAY   Lemborexant  (DAYVIGO ) 5 MG TABS Take 1 tablet (5 mg total) by mouth at bedtime as needed.   metoprolol  succinate (TOPROL -XL) 50 MG 24 hr tablet TAKE 1 TABLET BY MOUTH EVERY DAY   Multiple Vitamin (MULTIVITAMIN) tablet Take 1 tablet by mouth daily.   olmesartan  (BENICAR ) 20 MG tablet Take 1 tablet (20 mg total) by mouth daily.   [DISCONTINUED] olmesartan -hydrochlorothiazide (BENICAR  HCT) 40-12.5 MG tablet TAKE 1 TABLET BY MOUTH EVERY DAY (Patient taking differently: Take 0.5 tablets by mouth daily.)       11/09/2023    9:05 AM 11/04/2023   10:04 AM 08/04/2023   11:00 AM 07/06/2023    8:56 AM  GAD 7 : Generalized Anxiety Score  Nervous, Anxious, on Edge 2 2 0 0  Control/stop worrying  2 0 0  Worry too much - different things 2 2 0 0  Trouble relaxing 0 0 0 0  Restless 0 0 0 0  Easily annoyed or irritable 0 0 0 0  Afraid - awful might happen 0 0 0 0  Total GAD 7 Score  6 0 0  Anxiety Difficulty Somewhat difficult Not difficult at all Not difficult at all Not difficult at all       11/09/2023    9:05 AM 11/04/2023   10:04 AM 08/04/2023   10:59 AM  Depression screen PHQ 2/9  Decreased Interest 0 0 0  Down, Depressed, Hopeless 0 0 0  PHQ - 2 Score 0 0 0  Altered sleeping 3 0 1  Tired, decreased energy 3 3 0  Change in appetite 3 3 0  Feeling bad or failure about yourself  0 0 0  Trouble concentrating 0 0 0  Moving slowly or  fidgety/restless 0 0 0  Suicidal thoughts 0 0 0  PHQ-9 Score 9 6 1   Difficult doing work/chores Not difficult at all Not difficult at all Not difficult at all    BP Readings from Last 3 Encounters:  11/09/23 136/80  11/05/23 130/71  11/04/23 136/82    Physical Exam Vitals and nursing note reviewed.  Constitutional:      General: She is not in acute distress.    Appearance: Normal appearance. She  is well-developed.  HENT:     Head: Normocephalic and atraumatic.  Cardiovascular:     Rate and Rhythm: Normal rate and regular rhythm.     Heart sounds: No murmur heard. Pulmonary:     Effort: Pulmonary effort is normal. No respiratory distress.     Breath sounds: No wheezing or rhonchi.  Musculoskeletal:     Cervical back: Normal range of motion.     Right lower leg: No edema.     Left lower leg: No edema.  Lymphadenopathy:     Cervical: No cervical adenopathy.  Skin:    General: Skin is warm and dry.     Findings: No rash.  Neurological:     Mental Status: She is alert and oriented to person, place, and time.  Psychiatric:        Mood and Affect: Mood normal.        Behavior: Behavior normal.     Wt Readings from Last 3 Encounters:  11/09/23 163 lb (73.9 kg)  11/05/23 163 lb (73.9 kg)  11/04/23 163 lb (73.9 kg)    BP 136/80   Pulse 95   Ht 5' 4 (1.626 m)   Wt 163 lb (73.9 kg)   SpO2 98%   BMI 27.98 kg/m   Assessment and Plan:  Problem List Items Addressed This Visit       Unprioritized   Benign essential HTN - Primary (Chronic)   BP has been labile - up and down drastically at home. She cut the olmesartan  back due to low readings but then high again.  Yesterday BP 110 systolic and felt lightheaded.  Several hours later BP systolic 170.  She has a large component of anxiety as well. Recent labs at ED for syncope related to IV placement - low sodium and elevated CR. Stop olmesartan  hct. Begin olmesartan  20 mg in AM and continue Toprol  in PM Supplement  intake with Boost/Ensure/Premier protein.  Aggressive hydration. Check BP routinely every AM and record. Follow up in 3 weeks.      Relevant Medications   olmesartan  (BENICAR ) 20 MG tablet   Primary insomnia   Will try Dayvigo  5 mg at bedtime to aid with sleep. This may be contributing to her labile BP and general feelings of being unwell       Relevant Medications   Lemborexant  (DAYVIGO ) 5 MG TABS    Return in about 3 weeks (around 11/30/2023) for HTN.    Leita HILARIO Adie, MD [facility] [place]

## 2023-11-09 NOTE — Assessment & Plan Note (Signed)
 Will try Dayvigo  5 mg at bedtime to aid with sleep. This may be contributing to her labile BP and general feelings of being unwell

## 2023-11-09 NOTE — Telephone Encounter (Signed)
 FYI  KP

## 2023-11-18 ENCOUNTER — Ambulatory Visit

## 2023-11-29 DIAGNOSIS — L570 Actinic keratosis: Secondary | ICD-10-CM | POA: Diagnosis not present

## 2023-11-30 ENCOUNTER — Encounter: Payer: Self-pay | Admitting: Internal Medicine

## 2023-11-30 ENCOUNTER — Ambulatory Visit (INDEPENDENT_AMBULATORY_CARE_PROVIDER_SITE_OTHER): Admitting: Internal Medicine

## 2023-11-30 VITALS — BP 134/76 | HR 99 | Ht 64.0 in | Wt 164.0 lb

## 2023-11-30 DIAGNOSIS — I10 Essential (primary) hypertension: Secondary | ICD-10-CM

## 2023-11-30 DIAGNOSIS — F5101 Primary insomnia: Secondary | ICD-10-CM

## 2023-11-30 DIAGNOSIS — M1711 Unilateral primary osteoarthritis, right knee: Secondary | ICD-10-CM | POA: Diagnosis not present

## 2023-11-30 NOTE — Progress Notes (Addendum)
 Date:  11/30/2023   Name:  Heidi Powell   DOB:  [birth year]   MRN:  [id number]   Chief Complaint: Hypertension  Hypertension This is a chronic problem. The problem is controlled. Pertinent negatives include no chest pain, headaches, palpitations or shortness of breath. Past treatments include angiotensin blockers and beta blockers. The current treatment provides significant improvement.  Insomnia The problem occurs nightly.    Review of Systems  Constitutional:  Negative for fatigue and unexpected weight change.  HENT:  Negative for trouble swallowing.   Eyes:  Negative for visual disturbance.  Respiratory:  Negative for cough, chest tightness, shortness of breath and wheezing.   Cardiovascular:  Negative for chest pain, palpitations and leg swelling.  Gastrointestinal:  Negative for abdominal pain, constipation and diarrhea.  Musculoskeletal:  Negative for arthralgias and myalgias.  Neurological:  Negative for dizziness, weakness, light-headedness and headaches.  Psychiatric/Behavioral:  The patient has insomnia.      Lab Results  Component Value Date   NA 134 (L) 11/05/2023   K 4.2 11/05/2023   CO2 23 11/05/2023   GLUCOSE 157 (H) 11/05/2023   BUN 20 11/05/2023   CREATININE 1.34 (H) 11/05/2023   CALCIUM  9.6 11/05/2023   EGFR 61 02/16/2023   GFRNONAA 42 (L) 11/05/2023   Lab Results  Component Value Date   CHOL 204 (H) 02/16/2023   HDL 52 02/16/2023   LDLCALC 123 (H) 02/16/2023   TRIG 162 (H) 02/16/2023   CHOLHDL 3.9 02/16/2023   Lab Results  Component Value Date   TSH 0.650 02/16/2023   Lab Results  Component Value Date   HGBA1C 5.6 02/12/2022   Lab Results  Component Value Date   WBC 9.5 11/05/2023   HGB 13.1 11/05/2023   HCT 39.0 11/05/2023   MCV 93.5 11/05/2023   PLT 298 11/05/2023   Lab Results  Component Value Date   ALT 16 11/05/2023   AST 20 11/05/2023   ALKPHOS 90 11/05/2023   BILITOT 0.9 11/05/2023   Lab Results  Component Value  Date   VD25OH 39.9 11/06/2014     Patient Active Problem List   Diagnosis Date Noted   Internal derangement of left knee 09/08/2023   Osteoarthritis of left knee 03/08/2023   Primary osteoarthritis of right knee 08/14/2022   Primary insomnia 04/14/2021   Radial scar of left breast 11/04/2020   Female stress incontinence 01/21/2016   Benign essential HTN 12/18/2014   Mixed hyperlipidemia 10/24/2014    Allergies  Allergen Reactions   Penicillin V Potassium Rash    Past Surgical History:  Procedure Laterality Date   BREAST BIOPSY Right    neg   BREAST BIOPSY Left 04/01/2020   stereo, distortion, ribbon clip-COMPLEX SCLEROSING LESION.   BREAST BIOPSY Left 11/20/2020   Procedure: BREAST BIOPSY WITH NEEDLE LOCALIZATION;  Surgeon: Dessa Reyes ORN, MD;  Location: [facility] ORS;  Service: General;  Laterality: Left;   BREAST EXCISIONAL BIOPSY Left 11/20/2020   excision-COMPLEX SCLEROSING LESION.   COLONOSCOPY  08/21/2008   normal   FRACTURE SURGERY Right    arm no hardware   TUBAL LIGATION      Social History   Tobacco Use   Smoking status: Never   Smokeless tobacco: Never   Tobacco comments:    smoking cessation materials not required  Vaping Use   Vaping status: Never Used  Substance Use Topics   Alcohol use: Yes    Alcohol/week: 2.0 standard drinks of alcohol    Types: 2  Glasses of wine per week   Drug use: Never     Medication list has been reviewed and updated.  Current Meds  Medication Sig   atorvastatin  (LIPITOR) 10 MG tablet TAKE 1 TABLET BY MOUTH EVERY DAY   metoprolol  succinate (TOPROL -XL) 50 MG 24 hr tablet TAKE 1 TABLET BY MOUTH EVERY DAY   Multiple Vitamin (MULTIVITAMIN) tablet Take 1 tablet by mouth daily.   olmesartan  (BENICAR ) 20 MG tablet Take 1 tablet (20 mg total) by mouth daily.       11/30/2023    1:07 PM 11/09/2023    9:05 AM 11/04/2023   10:04 AM 08/04/2023   11:00 AM  GAD 7 : Generalized Anxiety Score  Nervous, Anxious, on Edge 1 2 2  0   Control/stop worrying 1  2 0  Worry too much - different things 1 2 2  0  Trouble relaxing 0 0 0 0  Restless 0 0 0 0  Easily annoyed or irritable 0 0 0 0  Afraid - awful might happen 0 0 0 0  Total GAD 7 Score 3  6 0  Anxiety Difficulty Somewhat difficult Somewhat difficult Not difficult at all Not difficult at all       11/30/2023    1:07 PM 11/09/2023    9:05 AM 11/04/2023   10:04 AM  Depression screen PHQ 2/9  Decreased Interest 0 0 0  Down, Depressed, Hopeless 0 0 0  PHQ - 2 Score 0 0 0  Altered sleeping 3 3 0  Tired, decreased energy 3 3 3   Change in appetite 0 3 3  Feeling bad or failure about yourself  0 0 0  Trouble concentrating 0 0 0  Moving slowly or fidgety/restless 0 0 0  Suicidal thoughts 0 0 0  PHQ-9 Score 6 9 6   Difficult doing work/chores Not difficult at all Not difficult at all Not difficult at all    BP Readings from Last 3 Encounters:  11/30/23 134/76  11/09/23 136/80  11/05/23 130/71    Physical Exam Vitals and nursing note reviewed.  Constitutional:      General: She is not in acute distress.    Appearance: Normal appearance. She is well-developed.  HENT:     Head: Normocephalic and atraumatic.  Cardiovascular:     Rate and Rhythm: Normal rate and regular rhythm.  Pulmonary:     Effort: Pulmonary effort is normal. No respiratory distress.     Breath sounds: No wheezing or rhonchi.  Musculoskeletal:     Cervical back: Normal range of motion.     Right lower leg: No edema.     Left lower leg: No edema.  Lymphadenopathy:     Cervical: No cervical adenopathy.  Skin:    General: Skin is warm and dry.     Findings: No rash.  Neurological:     General: No focal deficit present.     Mental Status: She is alert and oriented to person, place, and time.     Gait: Gait abnormal (due to right knee pain).  Psychiatric:        Mood and Affect: Mood normal.        Behavior: Behavior normal.     Wt Readings from Last 3 Encounters:  11/30/23 164  lb (74.4 kg)  11/09/23 163 lb (73.9 kg)  11/05/23 163 lb (73.9 kg)    BP 134/76   Pulse 99   Ht 5' 4 (1.626 m)   Wt 164 lb (74.4 kg)  SpO2 99%   BMI 28.15 kg/m   Assessment and Plan:  Problem List Items Addressed This Visit       Unprioritized   Benign essential HTN - Primary (Chronic)   Blood pressure is well controlled on olmesartan  20 mg and metoprolol  50 mg. No medication side effects noted.  BP at home ~145/80.  She is feeling well. Plan to continue current medications without change for now since she has surgery in 2 weeks.       Primary insomnia   She tried Dayvigo  free samples but only took it once with no benefit. Encourage her to try it again for several nights in a row.      Primary osteoarthritis of right knee   Surgery is planned for 10/08 on her right knee She is looking forward to being able to walk again.       No follow-ups on file.    Leita HILARIO Adie, MD [facility] [place]

## 2023-11-30 NOTE — Assessment & Plan Note (Addendum)
 Blood pressure is well controlled on olmesartan  20 mg and metoprolol  50 mg. No medication side effects noted.  BP at home ~145/80.  She is feeling well. Plan to continue current medications without change for now since she has surgery in 2 weeks.

## 2023-11-30 NOTE — Assessment & Plan Note (Signed)
 She tried Dayvigo  free samples but only took it once with no benefit. Encourage her to try it again for several nights in a row.

## 2023-11-30 NOTE — Assessment & Plan Note (Signed)
 Surgery is planned for 10/08 on her right knee She is looking forward to being able to walk again.

## 2023-12-01 ENCOUNTER — Other Ambulatory Visit: Payer: Self-pay | Admitting: Orthopedic Surgery

## 2023-12-07 ENCOUNTER — Other Ambulatory Visit: Payer: Self-pay

## 2023-12-07 ENCOUNTER — Encounter
Admission: RE | Admit: 2023-12-07 | Discharge: 2023-12-07 | Disposition: A | Discharge status: Home / Self Care | Source: Ambulatory Visit | Attending: Orthopedic Surgery | Admitting: Orthopedic Surgery

## 2023-12-07 VITALS — BP 176/95 | HR 85 | Temp 97.7°F | Resp 17 | Ht 64.0 in | Wt 164.7 lb

## 2023-12-07 DIAGNOSIS — M1712 Unilateral primary osteoarthritis, left knee: Secondary | ICD-10-CM | POA: Diagnosis not present

## 2023-12-07 DIAGNOSIS — Z01812 Encounter for preprocedural laboratory examination: Secondary | ICD-10-CM | POA: Diagnosis present

## 2023-12-07 DIAGNOSIS — Z01818 Encounter for other preprocedural examination: Secondary | ICD-10-CM

## 2023-12-07 HISTORY — DX: Unspecified osteoarthritis, unspecified site: M19.90

## 2023-12-07 LAB — SURGICAL PCR SCREEN
MRSA, PCR: NEGATIVE
Staphylococcus aureus: NEGATIVE

## 2023-12-07 LAB — URINALYSIS, ROUTINE W REFLEX MICROSCOPIC
Bilirubin Urine: NEGATIVE
Glucose, UA: NEGATIVE mg/dL
Hgb urine dipstick: NEGATIVE
Ketones, ur: NEGATIVE mg/dL
Leukocytes,Ua: NEGATIVE
Nitrite: NEGATIVE
Protein, ur: NEGATIVE mg/dL
Specific Gravity, Urine: 1.012 (ref 1.005–1.030)
pH: 5 (ref 5.0–8.0)

## 2023-12-07 NOTE — Patient Instructions (Signed)
 Your procedure is scheduled on: [facility] 12/15/23 Report to the Registration Desk on the 1st floor of the [facility]. To find out your arrival time, please call [phone] between 1PM - 3PM on: TUESDAY 12/14/23 If your arrival time is 6:00 am, do not arrive before that time as the [facility] entrance doors do not open until 6:00 am.  REMEMBER: Instructions that are not followed completely may result in serious medical risk, up to and including death; or upon the discretion of your surgeon and anesthesiologist your surgery may need to be rescheduled.  Do not eat food after midnight the night before surgery.  No gum chewing or hard candies.  You may however, drink CLEAR liquids up to 2 hours before you are scheduled to arrive for your surgery. Do not drink anything within 2 hours of your scheduled arrival time.  Clear liquids include: - water   - apple juice without pulp - gatorade (not RED colors) - black coffee or tea (Do NOT add milk or creamers to the coffee or tea) Do NOT drink anything that is not on this list.  In addition, your doctor has ordered for you to drink the provided:  Ensure Pre-Surgery Clear Carbohydrate Drink  Drinking this carbohydrate drink up to two hours before surgery helps to reduce insulin resistance and improve patient outcomes. Please complete drinking 2 hours before scheduled arrival time.  One week prior to surgery: Stop Anti-inflammatories (NSAIDS) such as Advil , Aleve, Ibuprofen , Motrin , Naproxen, Naprosyn and Aspirin based products such as Excedrin, Goody's Powder, BC Powder. Stop ANY OVER THE COUNTER supplements until after surgery.  You may however, continue to take Tylenol  if needed for pain up until the day of surgery.  Continue taking all of your other prescription medications up until the day of surgery.  ON THE DAY OF SURGERY ONLY TAKE THESE MEDICATIONS WITH SIPS OF WATER :  atorvastatin  (LIPITOR)  metoprolol  succinate (TOPROL -XL)   Use  inhalers on the day of surgery and bring to the hospital.  No Alcohol for 24 hours before or after surgery.  No Smoking including e-cigarettes for 24 hours before surgery.  No chewable tobacco products for at least 6 hours before surgery.  No nicotine patches on the day of surgery.  Do not use any recreational drugs for at least a week (preferably 2 weeks) before your surgery.  Please be advised that the combination of cocaine and anesthesia may have negative outcomes, up to and including death. If you test positive for cocaine, your surgery will be cancelled.  On the morning of surgery brush your teeth with toothpaste and water , you may rinse your mouth with mouthwash if you wish. Do not swallow any toothpaste or mouthwash.  Use CHG Soap or wipes as directed on instruction sheet.  Do not wear jewelry, make-up, hairpins, clips or nail polish.  For welded (permanent) jewelry: bracelets, anklets, waist bands, etc.  Please have this removed prior to surgery.  If it is not removed, there is a chance that hospital personnel will need to cut it off on the day of surgery.  Do not wear lotions, powders, or perfumes.   Do not shave body hair from the neck down 48 hours before surgery.  Contact lenses, hearing aids and dentures may not be worn into surgery.  Do not bring valuables to the hospital. [facility] is not responsible for any missing/lost belongings or valuables.   Bring your C-PAP to the hospital in case you may have to spend the night.  Notify your doctor if there is any change in your medical condition (cold, fever, infection).  Wear comfortable clothing (specific to your surgery type) to the hospital.  After surgery, you can help prevent lung complications by doing breathing exercises.  Take deep breaths and cough every 1-2 hours. Your doctor may order a device called an Incentive Spirometer to help you take deep breaths. When coughing or sneezing, hold a pillow firmly  against your incision with both hands. This is called "splinting." Doing this helps protect your incision. It also decreases belly discomfort.  If you are being admitted to the hospital overnight, leave your suitcase in the car. After surgery it may be brought to your room.  In case of increased patient census, it may be necessary for you, the patient, to continue your postoperative care in the Same Day Surgery department.  If you are being discharged the day of surgery, you will not be allowed to drive home. You will need a responsible individual to drive you home and stay with you for 24 hours after surgery.   If you are taking public transportation, you will need to have a responsible individual with you.  Please call the Pre-admissions Testing Dept. at [phone] if you have any questions about these instructions.  Surgery Visitation Policy:  Patients having surgery or a procedure may have two visitors.  Children under the age of 43 must have an adult with them who is not the patient.  Inpatient Visitation:    Visiting hours are 7 a.m. to 8 p.m. Up to four visitors are allowed at one time in a patient room. The visitors may rotate out with other people during the day.  One visitor age 69 or older may stay with the patient overnight and must be in the room by 8 p.m.  Merchandiser, retail to address health-related social needs:  [email or web address]    Pre-operative 5 CHG Bath Instructions   You can play a key role in reducing the risk of infection after surgery. Your skin needs to be as free of germs as possible. You can reduce the number of germs on your skin by washing with CHG (chlorhexidine  gluconate) soap before surgery. CHG is an antiseptic soap that kills germs and continues to kill germs even after washing.   DO NOT use if you have an allergy to chlorhexidine /CHG or antibacterial soaps. If your skin becomes reddened or irritated, stop using the CHG  and notify one of our RNs at [phone].   Please shower with the CHG soap starting 4 days before surgery using the following schedule:     Please keep in mind the following:  DO NOT shave, including legs and underarms, starting the day of your first shower.   You may shave your face at any point before/day of surgery.  Place clean sheets on your bed the day you start using CHG soap. Use a clean washcloth (not used since being washed) for each shower. DO NOT sleep with pets once you start using the CHG.   CHG Shower Instructions:  If you choose to wash your hair and private area, wash first with your normal shampoo/soap.  After you use shampoo/soap, rinse your hair and body thoroughly to remove shampoo/soap residue.  Turn the water  OFF and apply about 3 tablespoons (45 ml) of CHG soap to a CLEAN washcloth.  Apply CHG soap ONLY FROM YOUR NECK DOWN TO YOUR TOES (washing for 3-5 minutes)  DO NOT use CHG soap on face,  private areas, open wounds, or sores.  Pay special attention to the area where your surgery is being performed.  If you are having back surgery, having someone wash your back for you may be helpful. Wait 2 minutes after CHG soap is applied, then you may rinse off the CHG soap.  Pat dry with a clean towel  Put on clean clothes/pajamas   If you choose to wear lotion, please use ONLY the CHG-compatible lotions on the back of this paper.     Additional instructions for the day of surgery: DO NOT APPLY any lotions, deodorants, cologne, or perfumes.   Put on clean/comfortable clothes.  Brush your teeth.  Ask your nurse before applying any prescription medications to the skin.      CHG Compatible Lotions   Aveeno Moisturizing lotion  Cetaphil Moisturizing Cream  Cetaphil Moisturizing Lotion  Clairol Herbal Essence Moisturizing Lotion, Dry Skin  Clairol Herbal Essence Moisturizing Lotion, Extra Dry Skin  Clairol Herbal Essence Moisturizing Lotion, Normal Skin  Curel  Age Defying Therapeutic Moisturizing Lotion with Alpha Hydroxy  Curel Extreme Care Body Lotion  Curel Soothing Hands Moisturizing Hand Lotion  Curel Therapeutic Moisturizing Cream, Fragrance-Free  Curel Therapeutic Moisturizing Lotion, Fragrance-Free  Curel Therapeutic Moisturizing Lotion, Original Formula  Eucerin Daily Replenishing Lotion  Eucerin Dry Skin Therapy Plus Alpha Hydroxy Crme  Eucerin Dry Skin Therapy Plus Alpha Hydroxy Lotion  Eucerin Original Crme  Eucerin Original Lotion  Eucerin Plus Crme Eucerin Plus Lotion  Eucerin TriLipid Replenishing Lotion  Keri Anti-Bacterial Hand Lotion  Keri Deep Conditioning Original Lotion Dry Skin Formula Softly Scented  Keri Deep Conditioning Original Lotion, Fragrance Free Sensitive Skin Formula  Keri Lotion Fast Absorbing Fragrance Free Sensitive Skin Formula  Keri Lotion Fast Absorbing Softly Scented Dry Skin Formula  Keri Original Lotion  Keri Skin Renewal Lotion Keri Silky Smooth Lotion  Keri Silky Smooth Sensitive Skin Lotion  Nivea Body Creamy Conditioning Oil  Nivea Body Extra Enriched Teacher, adult education Moisturizing Lotion Nivea Crme  Nivea Skin Firming Lotion  NutraDerm 30 Skin Lotion  NutraDerm Skin Lotion  NutraDerm Therapeutic Skin Cream  NutraDerm Therapeutic Skin Lotion  ProShield Protective Hand Cream  Provon moisturizing lotion

## 2023-12-08 DIAGNOSIS — M1712 Unilateral primary osteoarthritis, left knee: Secondary | ICD-10-CM | POA: Diagnosis not present

## 2023-12-15 ENCOUNTER — Other Ambulatory Visit: Payer: Self-pay

## 2023-12-15 ENCOUNTER — Ambulatory Visit

## 2023-12-15 ENCOUNTER — Ambulatory Visit
Admission: RE | Admit: 2023-12-15 | Discharge: 2023-12-16 | Disposition: A | Discharge status: Home / Self Care | Attending: Orthopedic Surgery | Admitting: Orthopedic Surgery

## 2023-12-15 ENCOUNTER — Encounter
Admission: RE | Disposition: A | Discharge status: Home / Self Care | Payer: Self-pay | Source: Home / Self Care | Attending: Orthopedic Surgery

## 2023-12-15 ENCOUNTER — Ambulatory Visit: Admitting: General Practice

## 2023-12-15 ENCOUNTER — Encounter: Payer: Self-pay | Admitting: Orthopedic Surgery

## 2023-12-15 ENCOUNTER — Ambulatory Visit: Payer: Self-pay | Admitting: Urgent Care

## 2023-12-15 DIAGNOSIS — M1712 Unilateral primary osteoarthritis, left knee: Secondary | ICD-10-CM | POA: Insufficient documentation

## 2023-12-15 DIAGNOSIS — Z79899 Other long term (current) drug therapy: Secondary | ICD-10-CM | POA: Diagnosis not present

## 2023-12-15 DIAGNOSIS — Z96652 Presence of left artificial knee joint: Secondary | ICD-10-CM | POA: Diagnosis not present

## 2023-12-15 DIAGNOSIS — E782 Mixed hyperlipidemia: Secondary | ICD-10-CM | POA: Diagnosis not present

## 2023-12-15 DIAGNOSIS — I10 Essential (primary) hypertension: Secondary | ICD-10-CM | POA: Insufficient documentation

## 2023-12-15 DIAGNOSIS — R609 Edema, unspecified: Secondary | ICD-10-CM | POA: Diagnosis not present

## 2023-12-15 HISTORY — PX: TOTAL KNEE ARTHROPLASTY: SHX125

## 2023-12-15 SURGERY — ARTHROPLASTY, KNEE, TOTAL
Anesthesia: General | Site: Knee | Laterality: Left

## 2023-12-15 MED ORDER — BUPIVACAINE LIPOSOME 1.3 % IJ SUSP
INTRAMUSCULAR | Status: AC
  Filled 2023-12-15: qty 20

## 2023-12-15 MED ORDER — ACETAMINOPHEN 10 MG/ML IV SOLN
INTRAVENOUS | Status: DC | PRN
  Administered 2023-12-15: 1000 mg via INTRAVENOUS

## 2023-12-15 MED ORDER — FENTANYL CITRATE (PF) 100 MCG/2ML IJ SOLN
INTRAMUSCULAR | Status: DC | PRN
  Administered 2023-12-15 (×2): 25 ug via INTRAVENOUS

## 2023-12-15 MED ORDER — SURGIPHOR WOUND IRRIGATION SYSTEM - OPTIME: TOPICAL | Status: DC | PRN

## 2023-12-15 MED ORDER — ONDANSETRON HCL 4 MG/2ML IJ SOLN: 4.0000 mg | Freq: Four times a day (QID) | INTRAMUSCULAR | Status: DC | PRN

## 2023-12-15 MED ORDER — PHENOL 1.4 % MT LIQD: 1.0000 | OROMUCOSAL | Status: DC | PRN

## 2023-12-15 MED ORDER — DEXAMETHASONE SODIUM PHOSPHATE 10 MG/ML IJ SOLN
8.0000 mg | Freq: Once | INTRAMUSCULAR | Status: AC
  Administered 2023-12-15: 8 mg via INTRAVENOUS

## 2023-12-15 MED ORDER — ATORVASTATIN CALCIUM 10 MG PO TABS
10.0000 mg | ORAL_TABLET | Freq: Every day | ORAL | Status: DC
  Administered 2023-12-16: 10 mg via ORAL
  Filled 2023-12-15: qty 1

## 2023-12-15 MED ORDER — IRBESARTAN 150 MG PO TABS
150.0000 mg | ORAL_TABLET | Freq: Every day | ORAL | Status: DC
  Administered 2023-12-15 – 2023-12-16 (×2): 150 mg via ORAL
  Filled 2023-12-15 (×2): qty 1

## 2023-12-15 MED ORDER — SODIUM CHLORIDE 0.9 % IR SOLN
Status: DC | PRN
  Administered 2023-12-15: 3000 mL

## 2023-12-15 MED ORDER — OXYCODONE HCL 5 MG/5ML PO SOLN: 5.0000 mg | Freq: Once | ORAL | Status: DC | PRN

## 2023-12-15 MED ORDER — PROPOFOL 500 MG/50ML IV EMUL
INTRAVENOUS | Status: DC | PRN
  Administered 2023-12-15: 150 ug/kg/min via INTRAVENOUS

## 2023-12-15 MED ORDER — KETOROLAC TROMETHAMINE 15 MG/ML IJ SOLN
7.5000 mg | Freq: Four times a day (QID) | INTRAMUSCULAR | Status: DC
  Administered 2023-12-15 – 2023-12-16 (×3): 7.5 mg via INTRAVENOUS
  Filled 2023-12-15 (×3): qty 1

## 2023-12-15 MED ORDER — CEFAZOLIN SODIUM-DEXTROSE 2-4 GM/100ML-% IV SOLN
2.0000 g | Freq: Four times a day (QID) | INTRAVENOUS | Status: AC
  Administered 2023-12-15 (×2): 2 g via INTRAVENOUS
  Filled 2023-12-15 (×2): qty 100

## 2023-12-15 MED ORDER — PROPOFOL 10 MG/ML IV BOLUS
INTRAVENOUS | Status: DC | PRN
  Administered 2023-12-15: 20 mg via INTRAVENOUS

## 2023-12-15 MED ORDER — DOCUSATE SODIUM 100 MG PO CAPS
100.0000 mg | ORAL_CAPSULE | Freq: Two times a day (BID) | ORAL | Status: DC
  Administered 2023-12-15 – 2023-12-16 (×2): 100 mg via ORAL
  Filled 2023-12-15 (×2): qty 1

## 2023-12-15 MED ORDER — LACTATED RINGERS IV SOLN: INTRAVENOUS | Status: DC

## 2023-12-15 MED ORDER — TRANEXAMIC ACID-NACL 1000-0.7 MG/100ML-% IV SOLN
INTRAVENOUS | Status: AC
  Filled 2023-12-15: qty 100

## 2023-12-15 MED ORDER — PANTOPRAZOLE SODIUM 40 MG PO TBEC
40.0000 mg | DELAYED_RELEASE_TABLET | Freq: Every day | ORAL | Status: DC
  Administered 2023-12-15 – 2023-12-16 (×2): 40 mg via ORAL
  Filled 2023-12-15 (×2): qty 1

## 2023-12-15 MED ORDER — MIDAZOLAM HCL 5 MG/5ML IJ SOLN
INTRAMUSCULAR | Status: DC | PRN
  Administered 2023-12-15: 2 mg via INTRAVENOUS

## 2023-12-15 MED ORDER — ONDANSETRON HCL 4 MG PO TABS: 4.0000 mg | ORAL_TABLET | Freq: Four times a day (QID) | ORAL | Status: DC | PRN

## 2023-12-15 MED ORDER — TRANEXAMIC ACID-NACL 1000-0.7 MG/100ML-% IV SOLN
1000.0000 mg | INTRAVENOUS | Status: AC
Start: 2023-12-15 — End: 2023-12-15
  Administered 2023-12-15 (×2): 1000 mg via INTRAVENOUS

## 2023-12-15 MED ORDER — METOCLOPRAMIDE HCL 5 MG/ML IJ SOLN: 5.0000 mg | Freq: Three times a day (TID) | INTRAMUSCULAR | Status: DC | PRN

## 2023-12-15 MED ORDER — CEFAZOLIN SODIUM-DEXTROSE 2-4 GM/100ML-% IV SOLN
2.0000 g | INTRAVENOUS | Status: AC
  Administered 2023-12-15: 2 g via INTRAVENOUS

## 2023-12-15 MED ORDER — ACETAMINOPHEN 500 MG PO TABS
1000.0000 mg | ORAL_TABLET | Freq: Three times a day (TID) | ORAL | Status: DC
  Administered 2023-12-15: 1000 mg via ORAL
  Filled 2023-12-15 (×3): qty 2

## 2023-12-15 MED ORDER — BUPIVACAINE HCL (PF) 0.5 % IJ SOLN
INTRAMUSCULAR | Status: DC | PRN
  Administered 2023-12-15: 2.6 mL

## 2023-12-15 MED ORDER — MORPHINE SULFATE (PF) 2 MG/ML IV SOLN: 0.5000 mg | INTRAVENOUS | Status: DC | PRN

## 2023-12-15 MED ORDER — SODIUM CHLORIDE (PF) 0.9 % IJ SOLN
INTRAMUSCULAR | Status: AC
  Filled 2023-12-15: qty 20

## 2023-12-15 MED ORDER — CHLORHEXIDINE GLUCONATE 0.12 % MT SOLN
15.0000 mL | Freq: Once | OROMUCOSAL | Status: AC
  Administered 2023-12-15: 15 mL via OROMUCOSAL

## 2023-12-15 MED ORDER — TRAMADOL HCL 50 MG PO TABS
50.0000 mg | ORAL_TABLET | Freq: Four times a day (QID) | ORAL | Status: DC | PRN
  Administered 2023-12-15 – 2023-12-16 (×2): 50 mg via ORAL
  Filled 2023-12-15 (×2): qty 1

## 2023-12-15 MED ORDER — ACETAMINOPHEN 325 MG PO TABS: 325.0000 mg | ORAL_TABLET | Freq: Four times a day (QID) | ORAL | Status: DC | PRN

## 2023-12-15 MED ORDER — ENOXAPARIN SODIUM 30 MG/0.3ML IJ SOSY
30.0000 mg | PREFILLED_SYRINGE | Freq: Two times a day (BID) | INTRAMUSCULAR | Status: DC
  Administered 2023-12-16: 30 mg via SUBCUTANEOUS
  Filled 2023-12-15: qty 0.3

## 2023-12-15 MED ORDER — SODIUM CHLORIDE (PF) 0.9 % IJ SOLN
INTRAMUSCULAR | Status: DC | PRN
  Administered 2023-12-15: 70 mL

## 2023-12-15 MED ORDER — CHLORHEXIDINE GLUCONATE 0.12 % MT SOLN
OROMUCOSAL | Status: AC
  Filled 2023-12-15: qty 15

## 2023-12-15 MED ORDER — MENTHOL 3 MG MT LOZG: 1.0000 | LOZENGE | OROMUCOSAL | Status: DC | PRN

## 2023-12-15 MED ORDER — ONDANSETRON HCL 4 MG/2ML IJ SOLN
INTRAMUSCULAR | Status: DC | PRN
  Administered 2023-12-15: 4 mg via INTRAVENOUS

## 2023-12-15 MED ORDER — ORAL CARE MOUTH RINSE: 15.0000 mL | Freq: Once | OROMUCOSAL | Status: AC

## 2023-12-15 MED ORDER — METOCLOPRAMIDE HCL 10 MG PO TABS: 5.0000 mg | ORAL_TABLET | Freq: Three times a day (TID) | ORAL | Status: DC | PRN

## 2023-12-15 MED ORDER — OXYCODONE HCL 5 MG PO TABS: 5.0000 mg | ORAL_TABLET | Freq: Once | ORAL | Status: DC | PRN

## 2023-12-15 MED ORDER — FENTANYL CITRATE (PF) 100 MCG/2ML IJ SOLN: 25.0000 ug | INTRAMUSCULAR | Status: DC | PRN

## 2023-12-15 MED ORDER — FENTANYL CITRATE (PF) 100 MCG/2ML IJ SOLN
INTRAMUSCULAR | Status: AC
  Filled 2023-12-15: qty 2

## 2023-12-15 MED ORDER — CEFAZOLIN SODIUM-DEXTROSE 2-4 GM/100ML-% IV SOLN
INTRAVENOUS | Status: AC
  Filled 2023-12-15: qty 100

## 2023-12-15 MED ORDER — BUPIVACAINE-EPINEPHRINE (PF) 0.25% -1:200000 IJ SOLN
INTRAMUSCULAR | Status: AC
  Filled 2023-12-15: qty 30

## 2023-12-15 MED ORDER — MIDAZOLAM HCL 2 MG/2ML IJ SOLN
INTRAMUSCULAR | Status: AC
  Filled 2023-12-15: qty 2

## 2023-12-15 MED ORDER — METOPROLOL SUCCINATE ER 25 MG PO TB24
50.0000 mg | ORAL_TABLET | Freq: Every day | ORAL | Status: DC
  Administered 2023-12-16: 50 mg via ORAL
  Filled 2023-12-15: qty 2

## 2023-12-15 MED ORDER — PHENYLEPHRINE 80 MCG/ML (10ML) SYRINGE FOR IV PUSH (FOR BLOOD PRESSURE SUPPORT)
PREFILLED_SYRINGE | INTRAVENOUS | Status: DC | PRN
  Administered 2023-12-15 (×2): 40 ug via INTRAVENOUS

## 2023-12-15 MED ORDER — SODIUM CHLORIDE 0.9 % IV SOLN: INTRAVENOUS | Status: DC

## 2023-12-15 MED ORDER — HYDROCODONE-ACETAMINOPHEN 5-325 MG PO TABS
1.0000 | ORAL_TABLET | ORAL | Status: DC | PRN
  Administered 2023-12-15 – 2023-12-16 (×3): 1 via ORAL
  Filled 2023-12-15 (×3): qty 1

## 2023-12-15 SURGICAL SUPPLY — 55 items
BLADE PATELLA REAM PILOT HOLE (MISCELLANEOUS) IMPLANT
BLADE REAMER PATELLA SZ29 (BLADE) IMPLANT
BLADE SAW 90X13X1.19 OSCILLAT (BLADE) IMPLANT
BLADE SAW SAG 25X90X1.19 (BLADE) ×1 IMPLANT
BLADE SAW SAG 29X58X.64 (BLADE) ×1 IMPLANT
BNDG ELASTIC 6INX 5YD STR LF (GAUZE/BANDAGES/DRESSINGS) ×1 IMPLANT
BOWL CEMENT MIX W/ADAPTER (MISCELLANEOUS) IMPLANT
CHLORAPREP W/TINT 26 (MISCELLANEOUS) ×2 IMPLANT
COMPONENT FEM KNEE STD PS 8 LT (Joint) IMPLANT
COMPONENT PATELLA PEG 3 32 (Joint) IMPLANT
COOLER ICEMAN CLASSIC (MISCELLANEOUS) ×1 IMPLANT
CUFF TRNQT CYL 24X4X16.5-23 (TOURNIQUET CUFF) IMPLANT
DERMABOND ADVANCED .7 DNX12 (GAUZE/BANDAGES/DRESSINGS) ×1 IMPLANT
DRAPE SHEET LG 3/4 BI-LAMINATE (DRAPES) ×2 IMPLANT
DRSG MEPILEX SACRM 8.7X9.8 (GAUZE/BANDAGES/DRESSINGS) ×1 IMPLANT
DRSG OPSITE POSTOP 4X8 (GAUZE/BANDAGES/DRESSINGS) IMPLANT
ELECTRODE REM PT RTRN 9FT ADLT (ELECTROSURGICAL) ×1 IMPLANT
GLOVE BIO SURGEON STRL SZ8 (GLOVE) ×1 IMPLANT
GLOVE BIOGEL PI IND STRL 8 (GLOVE) ×1 IMPLANT
GLOVE PI ORTHO PRO STRL 7.5 (GLOVE) ×2 IMPLANT
GLOVE PI ORTHO PRO STRL SZ8 (GLOVE) ×2 IMPLANT
GLOVE SURG SYN 7.5 PF PI (GLOVE) ×1 IMPLANT
GOWN SRG XL LONG LVL 3 NONREIN (GOWNS) ×1 IMPLANT
GOWN SRG XL LVL 3 NONREINFORCE (GOWNS) ×1 IMPLANT
GOWN STRL REUS W/ TWL LRG LVL3 (GOWN DISPOSABLE) ×1 IMPLANT
HOOD PEEL AWAY T7 (MISCELLANEOUS) ×2 IMPLANT
KIT TURNOVER KIT A (KITS) ×1 IMPLANT
LINER TIB PS CD/8-9 10 LT (Liner) IMPLANT
MANIFOLD NEPTUNE II (INSTRUMENTS) ×1 IMPLANT
MARKER SKIN DUAL TIP RULER LAB (MISCELLANEOUS) ×1 IMPLANT
NDL HYPO 21X1.5 SAFETY (NEEDLE) ×1 IMPLANT
NEEDLE HYPO 21X1.5 SAFETY (NEEDLE) ×1 IMPLANT
PACK TOTAL KNEE (MISCELLANEOUS) ×1 IMPLANT
PAD ARMBOARD POSITIONER FOAM (MISCELLANEOUS) ×3 IMPLANT
PAD COLD UNI WRAP-ON (PAD) ×1 IMPLANT
PENCIL SMOKE EVACUATOR (MISCELLANEOUS) ×1 IMPLANT
PIN DRILL HDLS TROCAR 75 4PK (PIN) IMPLANT
SCREW FEMALE HEX FIX 25X2.5 (ORTHOPEDIC DISPOSABLE SUPPLIES) IMPLANT
SCREW HEX HEADED 3.5X27 DISP (ORTHOPEDIC DISPOSABLE SUPPLIES) IMPLANT
SLEEVE SCD COMPRESS KNEE MED (STOCKING) ×1 IMPLANT
SOL .9 NS 3000ML IRR UROMATIC (IV SOLUTION) ×1 IMPLANT
SOLN STERILE WATER 1000 ML (IV SOLUTION) ×1 IMPLANT
SOLN STERILE WATER BTL 1000 ML (IV SOLUTION) ×1 IMPLANT
SOLUTION IRRIG SURGIPHOR (IV SOLUTION) ×1 IMPLANT
STEM TIB PS KNEE D 0D LT (Stem) IMPLANT
STOCKINETTE IMPERV 14X48 (MISCELLANEOUS) ×1 IMPLANT
SUT STRATAFIX 14 PDO 36 VLT (SUTURE) ×1 IMPLANT
SUT VIC AB 0 CT1 36 (SUTURE) ×1 IMPLANT
SUT VIC AB 2-0 CT2 27 (SUTURE) ×2 IMPLANT
SUTURE STRATA SPIR 4-0 18 (SUTURE) ×1 IMPLANT
SUTURE VICRYL 1-0 27IN ABS (SUTURE) ×1 IMPLANT
SYR 20ML LL LF (SYRINGE) ×2 IMPLANT
TAPE CLOTH 3X10 WHT NS LF (GAUZE/BANDAGES/DRESSINGS) ×1 IMPLANT
TIP FAN IRRIG PULSAVAC PLUS (DISPOSABLE) ×1 IMPLANT
TRAP FLUID SMOKE EVACUATOR (MISCELLANEOUS) ×1 IMPLANT

## 2023-12-15 NOTE — Transfer of Care (Signed)
 Immediate Anesthesia Transfer of Care Note  Patient: Heidi Powell  Procedure(s) Performed: ARTHROPLASTY, KNEE, TOTAL (Left: Knee)  Patient Location: PACU  Anesthesia Type:Spinal  Level of Consciousness: alert   Airway & Oxygen Therapy: Patient Spontanous Breathing  Post-op Assessment: Report given to RN and Post -op Vital signs reviewed and stable  Post vital signs: Reviewed  Last Vitals:  Vitals Value Taken Time  BP 126/97 12/15/23 12:15  Temp    Pulse 65 12/15/23 12:15  Resp 15 12/15/23 12:15  SpO2 100 % 12/15/23 12:15  Vitals shown include unfiled device data.  Last Pain:  Vitals:   12/15/23 0848  TempSrc: Temporal  PainSc: 4          Complications: There were no known notable events for this encounter.

## 2023-12-15 NOTE — Discharge Summary (Incomplete)
 Physician Discharge Summary  Patient ID: Heidi Powell MRN: [id number] DOB/AGE: 07/19/1950 73 y.o.  Admit date: 12/15/2023 Discharge date: 12/16/2023  Admission Diagnoses:  Primary osteoarthritis of left knee [M17.12] Left knee pain, unspecified chronicity [M25.562] S/P TKR (total knee replacement), left [Z96.652]   Discharge Diagnoses: Patient Active Problem List   Diagnosis Date Noted   S/P TKR (total knee replacement), left 12/15/2023   Internal derangement of left knee 09/08/2023   Osteoarthritis of left knee 03/08/2023   Primary osteoarthritis of right knee 08/14/2022   Primary insomnia 04/14/2021   Radial scar of left breast 11/04/2020   Female stress incontinence 01/21/2016   Benign essential HTN 12/18/2014   Mixed hyperlipidemia 10/24/2014    Past Medical History:  Diagnosis Date   Allergy    Hyperlipidemia    Hypertension    Osteoarthritis    Vasovagal syncope 10/24/2014     Transfusion: None.   Consultants (if any):   Discharged Condition: Improved  Hospital Course: Heidi Powell is an 73 y.o. female who was admitted 12/15/2023 with a diagnosis of left knee osteoarthritis and went to the operating room on 12/15/2023 and underwent the above named procedures.    Surgeries: Procedure(s): ARTHROPLASTY, KNEE, TOTAL on 12/15/2023 Patient tolerated the surgery well. Taken to PACU where she was stabilized and then transferred to the post op recovery area.  Started on Lovenox 30mg  q 12 hrs. Heels elevated on bed with rolled towels. No evidence of DVT. Negative Homan. Physical therapy started on day #1 for gait training and transfer. OT started day #1 for ADL and assisted devices.  Patient's IV was removed on POD1.  Implants: Femur: Persona Size 8 CR PPS   Tibia: Persona Size D OsseoTi  Poly: 10mm MC  Patella: 32x28mm symmetric osseoti   She was given perioperative antibiotics:  Anti-infectives (From admission, onward)    Start     Dose/Rate Route  Frequency Ordered Stop   12/15/23 1645  ceFAZolin (ANCEF) IVPB 2g/100 mL premix        2 g 200 mL/hr over 30 Minutes Intravenous Every 6 hours 12/15/23 1638 12/15/23 2244   12/15/23 0600  ceFAZolin (ANCEF) IVPB 2g/100 mL premix        2 g 200 mL/hr over 30 Minutes Intravenous On call to O.R. 12/15/23 0246 12/15/23 1032     .  She was given sequential compression devices, early ambulation, and Lovenox for DVT prophylaxis.  She benefited maximally from the hospital stay and there were no complications.    Recent vital signs:  Vitals:   12/15/23 2316 12/16/23 0550  BP: (!) 160/74 (!) 164/84  Pulse: 78 69  Resp: 16 16  Temp: 98.6 F (37 C) 98.6 F (37 C)  SpO2: 98% 98%    Recent laboratory studies:  Lab Results  Component Value Date   HGB 12.1 12/16/2023   HGB 13.1 11/05/2023   HGB 12.0 10/15/2023   Lab Results  Component Value Date   WBC 14.4 (H) 12/16/2023   PLT 235 12/16/2023   No results found for: INR Lab Results  Component Value Date   NA 138 12/16/2023   K 4.1 12/16/2023   CL 113 (H) 12/16/2023   CO2 22 12/16/2023   BUN 15 12/16/2023   CREATININE 1.33 (H) 12/16/2023   GLUCOSE 116 (H) 12/16/2023    Discharge Medications:   Allergies as of 12/16/2023       Reactions   Penicillin V Potassium Rash  Medication List     TAKE these medications    atorvastatin  10 MG tablet Commonly known as: LIPITOR TAKE 1 TABLET BY MOUTH EVERY DAY   CALCIUM  PO Take 1 tablet by mouth once a week.   enoxaparin 40 MG/0.4ML injection Commonly known as: LOVENOX Inject 0.4 mLs (40 mg total) into the skin daily.   metoprolol  succinate 50 MG 24 hr tablet Commonly known as: TOPROL -XL TAKE 1 TABLET BY MOUTH EVERY DAY   multivitamin tablet Take 1 tablet by mouth daily.   multivitamin with minerals Tabs tablet Take 1 tablet by mouth once a week.   olmesartan  20 MG tablet Commonly known as: BENICAR  Take 1 tablet (20 mg total) by mouth daily.    ondansetron  4 MG tablet Commonly known as: ZOFRAN  Take 1 tablet (4 mg total) by mouth every 6 (six) hours as needed for nausea.   oxyCODONE  5 MG immediate release tablet Commonly known as: Roxicodone  Take 0.5-1 tablets (2.5-5 mg total) by mouth every 8 (eight) hours as needed for breakthrough pain.   traMADol  50 MG tablet Commonly known as: ULTRAM  Take 1-2 tablets (50-100 mg total) by mouth every 6 (six) hours as needed for moderate pain (pain score 4-6).   Voltaren  Arthritis Pain 1 % Gel Generic drug: diclofenac  Sodium Apply 1-2 Applications topically 4 (four) times daily as needed (pain.).               Durable Medical Equipment  (From admission, onward)           Start     Ordered   12/15/23 1217  For home use only DME 3 n 1  Once        12/15/23 1216   12/15/23 1216  For home use only DME Walker  Once       Question:  Patient needs a walker to treat with the following condition  Answer:  History of total knee arthroplasty, left   12/15/23 1216           Diagnostic Studies: DG Knee 1-2 Views Left Result Date: 12/15/2023 CLINICAL DATA:  Elective surgery. EXAM: LEFT KNEE - 1-2 VIEW COMPARISON:  None Available. FINDINGS: Left knee arthroplasty in expected alignment. No periprosthetic lucency or fracture. Recent postsurgical change includes air and edema in the soft tissues and joint space. IMPRESSION: Left knee arthroplasty without immediate postoperative complication. Electronically Signed   By: Andrea Gasman M.D.   On: 12/15/2023 12:19   Disposition: Plan for discharge home pending progress with PT.   Follow-up Information     Charlene Debby BROCKS, PA-C Follow up in 2 week(s).   Specialties: Orthopedic Surgery, Emergency Medicine Contact information: [address] [place] [address] [phone]                Signed: Lynwood LITTIE Level PA-C 12/16/2023, 7:20 AM

## 2023-12-15 NOTE — Discharge Instructions (Signed)

## 2023-12-15 NOTE — Plan of Care (Signed)
   Problem: Pain Management: Goal: Pain level will decrease with appropriate interventions Outcome: Progressing   Problem: Skin Integrity: Goal: Will show signs of wound healing Outcome: Progressing

## 2023-12-15 NOTE — Progress Notes (Signed)
 PT Cancellation Note  Patient Details Name: Heidi Powell MRN: [id number] DOB: [birth year]   Cancelled Treatment:    Reason Eval/Treat Not Completed: Medical issues which prohibited therapy Checked with nursing in the PACU and later in post-op.  Pt starting to get some sensation/control in R LE but still numb and not appropriate for PT this date.  Will plan to initiate PT POD1 AM.    Carmin JONELLE Deed, DPT 12/15/2023, 4:43 PM

## 2023-12-15 NOTE — Interval H&P Note (Signed)
 Patient history and physical updated. Consent reviewed including risks, benefits, and alternatives to surgery. Patient agrees with above plan to proceed with left total knee arthroplasty.

## 2023-12-15 NOTE — Plan of Care (Signed)
  Problem: Pain Management: Goal: Pain level will decrease with appropriate interventions 12/15/2023 1719 by Evern Shasta BROCKS, RN Outcome: Progressing 12/15/2023 1711 by Evern Shasta BROCKS, RN Outcome: Progressing   Problem: Skin Integrity: Goal: Will show signs of wound healing Outcome: Progressing

## 2023-12-15 NOTE — Anesthesia Preprocedure Evaluation (Signed)
 Anesthesia Evaluation  Patient identified by MRN, date of birth, ID band Patient awake    Reviewed: Allergy & Precautions, NPO status , Patient's Chart, lab work & pertinent test results, reviewed documented beta blocker date and time   Airway Mallampati: I  TM Distance: >3 FB Neck ROM: Full    Dental no notable dental hx. (+) Chipped   Pulmonary neg pulmonary ROS   Pulmonary exam normal        Cardiovascular hypertension, Pt. on medications negative cardio ROS Normal cardiovascular examI     Neuro/Psych negative neurological ROS  negative psych ROS   GI/Hepatic negative GI ROS, Neg liver ROS,,,  Endo/Other  negative endocrine ROS    Renal/GU      Musculoskeletal   Abdominal   Peds  Hematology negative hematology ROS (+)   Anesthesia Other Findings Past Medical History: No date: Allergy No date: Hyperlipidemia No date: Hypertension No date: Osteoarthritis 10/24/2014: Vasovagal syncope  Past Surgical History: No date: BREAST BIOPSY; Right     Comment:  neg 04/01/2020: BREAST BIOPSY; Left     Comment:  stereo, distortion, ribbon clip-COMPLEX SCLEROSING               LESION. 11/20/2020: BREAST BIOPSY; Left     Comment:  Procedure: BREAST BIOPSY WITH NEEDLE LOCALIZATION;                Surgeon: Dessa Reyes ORN, MD;  Location: [facility] ORS;                Service: General;  Laterality: Left; 11/20/2020: BREAST EXCISIONAL BIOPSY; Left     Comment:  excision-COMPLEX SCLEROSING LESION. 08/21/2008: COLONOSCOPY     Comment:  normal No date: FRACTURE SURGERY; Right     Comment:  arm no hardware No date: TUBAL LIGATION     Reproductive/Obstetrics negative OB ROS                              Anesthesia Physical Anesthesia Plan  ASA: 2  Anesthesia Plan: Spinal and General   Post-op Pain Management:    Induction: Intravenous  PONV Risk Score and Plan: 2 and Ondansetron ,  Dexamethasone , Propofol  infusion, TIVA and Midazolam   Airway Management Planned: Natural Airway and Nasal Cannula  Additional Equipment:   Intra-op Plan:   Post-operative Plan:   Informed Consent: I have reviewed the patients History and Physical, chart, labs and discussed the procedure including the risks, benefits and alternatives for the proposed anesthesia with the patient or authorized representative who has indicated his/her understanding and acceptance.     Dental Advisory Given  Plan Discussed with: Anesthesiologist, CRNA and Surgeon  Anesthesia Plan Comments: (Patient reports no bleeding problems and no anticoagulant use.  Plan for spinal with backup GA  Patient consented for risks of anesthesia including but not limited to:  - adverse reactions to medications - damage to eyes, teeth, lips or other oral mucosa - nerve damage due to positioning  - risk of bleeding, infection and or nerve damage from spinal that could lead to paralysis - risk of headache or failed spinal - damage to teeth, lips or other oral mucosa - sore throat or hoarseness - damage to heart, brain, nerves, lungs, other parts of body or loss of life  Patient voiced understanding and assent.)        Anesthesia Quick Evaluation

## 2023-12-15 NOTE — H&P (Signed)
 History of Present Illness: The patient is an 73 y.o. female seen in clinic today for history physical for left total knee arthroplasty with Dr. Lorelle on 12/15/2023. Patient has advanced left knee osteoarthritis with complete loss of joint space in the medial compartment of the left knee with tricompartmental arthritic changes. Pain is severe located in the medial joint line. No relief with NSAIDs or injections. She has had very little to no results of pain relief with conservative treatment consisting of injections. Patient's pain is interfering with her quality of life and activities of daily living. Patient is seen Dr. Lorelle discussed total knee arthroplasty and agreed and consented the procedure.  The patient denies fevers, chills, numbness, tingling, shortness of breath, chest pain, recent illness, or any trauma. Patient is a non-smoker nondiabetic with an A1c of 5.7 and a BMI of 28  Past Medical History: Past Medical History:  Diagnosis Date  External hemorrhoids  HSV infection  Hyperlipidemia  Hypertension  Syncopal episodes   Past Surgical History: Past Surgical History:  Procedure Laterality Date  COLONOSCOPY 10/22/2008  Int hemorrhoids: CBF 10/2018 Recall ltr mailed  BREAST EXCISIONAL BIOPSY Left 04/01/2020  stereotactic  BREAST EXCISIONAL BIOPSY Left 11/20/2020  Dr Dessa  TUBAL LIGATION  Wisdom teeth removal   Past Family History: Family History  Problem Relation Age of Onset  High blood pressure (Hypertension) Mother  Kidney cancer Mother  Myocardial Infarction (Heart attack) Father  High blood pressure (Hypertension) Father  Coronary Artery Disease (Blocked arteries around heart) Father  Stroke Brother  Stroke Paternal Grandfather  sibling  Hyperlipidemia (Elevated cholesterol) Paternal Grandfather  sibling  Breast cancer Neg Hx  Colon cancer Neg Hx   Medications: Current Outpatient Medications  Medication Sig Dispense Refill  atorvastatin  (LIPITOR) 10  MG tablet Take 10 mg by mouth once daily. 12  metoprolol  SUCCinate (TOPROL -XL) 50 MG XL tablet Take 50 mg by mouth once daily  multivit-min/iron/folic acid/K (ADULTS MULTIVITAMIN ORAL) Take by mouth  olmesartan  (BENICAR ) 20 MG tablet Take 20 mg by mouth once daily   No current facility-administered medications for this visit.   Allergies: Allergies  Allergen Reactions  Penicillin V Potassium Rash    Visit Vitals: Vitals:  12/08/23 0949  BP: (!) 144/98    Review of Systems:  A comprehensive 14 point ROS was performed, reviewed, and the pertinent orthopaedic findings are documented in the HPI.  Physical Exam: General:  Well developed, well nourished, no apparent distress, normal affect, normal gait with no antalgic component.   HEENT: Head normocephalic, atraumatic, PERRL.   Abdomen: Soft, non tender, non distended, Bowel sounds present.  Heart: Examination of the heart reveals regular, rate, and rhythm. There is no murmur noted on ascultation. There is a normal apical pulse.  Lungs: Lungs are clear to auscultation. There is no wheeze, rhonchi, or crackles. There is normal expansion of bilateral chest walls.   Comprehensive Knee Exam: Gait Non-antalgic and fluid  Alignment Neutral   Inspection Right Left  Skin Normal appearance with no obvious deformity. No ecchymosis or erythema. Normal appearance with no obvious deformity. No ecchymosis or erythema.  Soft Tissue No focal soft tissue swelling No focal soft tissue swelling  Quad Atrophy None None   Palpation  Right Left  Tenderness No peripatellar, patellar tendon, quad tendon, medial/lateral joint line pain Medial joint line and anterior and medial parapatellar tenderness to palpation  Crepitus No patellofemoral or tibiofemoral crepitus No patellofemoral or tibiofemoral crepitus  Effusion None None   Range of Motion  Right Left  Flexion 0-125 0-125  Extension Full knee extension without hyperextension Full knee  extension without hyperextension   Ligamentous Exam Right Left  Lachman Normal Normal  Valgus 0 Normal Normal  Valgus 30 Normal Normal  Varus 0 Normal Normal  Varus 30 Normal Normal  Anterior Drawer Normal Normal  Posterior Drawer Normal Normal   Meniscal Exam Right Left  Hyperflexion Test Negative Positive  Hyperextension Test Negative Positive  McMurray's Negative Negative   Neurovascular Right Left  Quadriceps Strength 5/5 5/5  Hamstring Strength 5/5 5/5  Hip Abductor Strength 4/5 4/5  Distal Motor Normal Normal  Distal Sensory Normal light touch sensation Normal light touch sensation  Distal Pulses Normal Normal    Imaging Studies: I have reviewed AP, lateral,sunrise, and flexed PA weight bearing knee X-rays (4 views) of the right knee ordered and taken today in the office show moderate to severe degenerative changes with medial patellofemoral joint space narrowing osteophyte formation and sclerosis with medial bone-on-bone articulation in flexion. Kellgren-Lawrence grade 3/4. AP, sunrise, and flexed PA of the left knee also show mild to moderate medial joint space narrowing proximal tibial sclerosis Kellgren-Lawrence grade 2. No fractures or dislocations noted in either knee.  Narrative  This result has an attachment that is not available. EXAM DESCRIPTION: MR KNEE LEFT WO CONTRAST  CLINICAL HISTORY: evaluate internal derangement, degree of degenerative changes  COMPARISON: None Available.  TECHNIQUE: MRI of the knee is performed according to our usual protocol with multiplanar multi sequence imaging.  FINDINGS: The anterior cruciate ligament and posterior cruciate ligament are intact. The medial collateral ligament and lateral collateral ligament are intact. The lateral meniscus is unremarkable. There is a large tear to the root of the posterior horn of the medial meniscus with the body extruded medially. Myxoid degeneration to the body as well.  Loose body in  the anterior joint space measuring up to 12 mm. Severe medial compartment chondromalacia with large areas of full-thickness defect. Mild to moderate degenerative edema greater to the medial tibial plateau. There is mild-to-moderate patellofemoral compartment chondromalacia with areas of full-thickness defect to the upper pole and mild degenerative edema. Small joint effusion.  IMPRESSION: Large tear to the root of the posterior horn of the medial meniscus with the body extruded medially. Also myxoid degeneration to the body.  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  Loose body in the anterior joint space measuring up to 12 mm.  Electronically signed by: Reyes Frees MD 08/25/2023 02:20 PM EDT RP Workstation: [id number]  Assessment:  ICD-10-CM  1. Primary osteoarthritis of left knee M17.12  Left knee osteoarthritis  Plan: Heidi Powell is a 73 year old female with left knee bone on bone arthritis. Pain is severe, debilitating and interfering with her quality of life and activities of daily living. Pain is located along the medial joint line. X-rays show complete loss of joint space along the medial compartment. Risks, benefits, complications of a left total knee arthroplasty him discussed with the patient. Patient has agreed and consented procedure with Dr. Lorelle on 12/15/2023.  The hospitalization and post-operative care and rehabilitation were also discussed. The use of perioperative antibiotics and DVT prophylaxis were discussed. The risk, benefits and alternatives to a surgical intervention were discussed at length with the patient. The patient was also advised of risks related to the medical comorbidities and elevated body mass index (BMI). A lengthy discussion took place to review the  most common complications including but not limited to: stiffness, loss of  function, complex regional pain syndrome, deep vein thrombosis, pulmonary embolus, heart attack, stroke, infection, wound breakdown, numbness, intraoperative fracture, damage to nerves, tendon,muscles, arteries or other blood vessels, death and other possible complications from anesthesia. The patient was told that we will take steps to minimize these risks by using sterile technique, antibiotics and DVT prophylaxis when appropriate and follow the patient postoperatively in the office setting to monitor progress. The possibility of recurrent pain, no improvement in pain and actual worsening of pain were also discussed with the patient.   All questions answered patient agrees with above plan for left total knee arthroplasty.

## 2023-12-15 NOTE — Anesthesia Procedure Notes (Addendum)
 Spinal  Patient location during procedure: OR Start time: 12/15/2023 10:23 AM End time: 12/15/2023 10:23 AM Reason for block: surgical anesthesia Staffing Performed: other anesthesia staff  Anesthesiologist: Leavy Ned, MD Resident/CRNA: Destinae Neubecker, CRNA Other anesthesia staff: Seldon Skates, RN Performed by: Seldon Skates, RN Authorized by: Leavy Ned, MD   Preanesthetic Checklist Completed: patient identified, IV checked, site marked, risks and benefits discussed, surgical consent, monitors and equipment checked, pre-op evaluation and timeout performed Spinal Block Patient position: sitting Prep: DuraPrep Patient monitoring: heart rate, cardiac monitor, continuous pulse ox and blood pressure Approach: midline Location: L3-4 Injection technique: single-shot Needle Needle type: Pencan  Needle gauge: 24 G Needle length: 9 cm Assessment Sensory level: T4 Events: CSF return

## 2023-12-15 NOTE — Op Note (Signed)
 Patient Name: Heidi Powell  FMW:[id number]  Pre-Operative Diagnosis: Left knee Osteoarthritis  Post-Operative Diagnosis: (same)  Procedure: Left Total Knee Arthroplasty  Components/Implants: Femur: Persona Size 8 CR PPS   Tibia: Persona Size D OsseoTi  Poly: 10mm MC  Patella: 32x38mm symmetric osseoti  Femoral Valgus Cut Angle: 5 degrees  Distal Femoral Re-cut: none  Patella Resurfacing: yes   Date of Surgery: 12/15/2023  Surgeon: Arthea Sheer MD  Assistant: Debby Amber PA (present and scrubbed throughout the case, critical for assistance with exposure, retraction, instrumentation, and closure)   Anesthesiologist: Leavy  Anesthesia: Spinal   Tourniquet Time: 39 min  EBL: 50cc  IVF: 400cc  Complications: None   Brief history: The patient is a 73 year old female with a history of osteoarthritis of the left knee with pain limiting their range of motion and activities of daily living, which has failed multiple attempts at conservative therapy.  The risks and benefits of total knee arthroplasty as definitive surgical treatment were discussed with the patient, who opted to proceed with the operation.  After outpatient medical clearance and optimization was completed the patient was admitted to [facility] for the procedure.  All preoperative films were reviewed and an appropriate surgical plan was made prior to surgery. Preoperative range of motion was 0 to 125.  Description of procedure: The patient was brought to the operating room where laterality was confirmed by all those present to be the left side.   Spinal anesthesia was administered and the patient received an intravenous dose of antibiotics for surgical prophylaxis and a dose of tranexamic acid.  Patient is positioned supine on the operating room table with all bony prominences well-padded.  A well-padded tourniquet was applied to the left thigh.  The knee was then prepped and draped in usual  sterile fashion with multiple layers of adhesive and nonadhesive drapes.  All of those present in the operating room participated in a surgical timeout laterality and patient were confirmed.   An Esmarch was wrapped around the extremity and the leg was elevated and the knee flexed.  The tourniquet was inflated to a pressure of 250 mmHg. The Esmarch was removed and the leg was brought down to full extension.  The patella and tibial tubercle identified and outlined using a marking pen and a midline skin incision was made with a knife carried through the subcutaneous tissue down to the extensor retinaculum.  After exposure of the extensor mechanism the medial parapatellar arthrotomy was performed with a scalpel and electrocautery extending down medial and distal to the tibial tubercle taking care to avoid incising the patellar tendon.   A standard medial release was performed over the proximal tibia.  The knee was brought into extension in order to excise the fat pad taking care not to damage the patella tendon.  The superior soft tissue was removed from the anterior surface of the distal femur to visualize for the procedure.  The knee was then brought into flexion with the patella subluxed laterally and subluxing the tibia anteriorly.  The ACL was transected and removed with electrocautery and additional soft tissue was removed from the proximal surface of the tibia to fully expose. The PCL was found to be intact and was preserved.  An extramedullary tibial cutting guide was then applied to the leg with a spring-loaded ankle clamp placed around the distal tibia just above the malleoli the angulation of the guide was adjusted to give some posterior slope in the tibial resection with an appropriate  varus/valgus alignment.  The resection guide was then pinned to the proximal tibia and the proximal tibial surface was resected with an oscillating saw.  Careful attention was paid to ensure the blade did not disrupt any  of the soft tissues including any lateral or medial ligament.  Attention was then turned to the femur, with the knee slightly flexed a opening drill was used to enter the medullary canal of the femur.  After removing the drill marrow was suctioned out to decompress the distal femur.  An intramedullary femoral guide was then inserted into the drill hole and the alignment guide was seated firmly against the distal end of the medial femoral condyle.  The distal femoral cutting guide was then attached and pinned securely to the anterior surface of the femur and the intramedullary rod and alignment guide was removed.  Distal femur resection was then performed with an oscillating saw with retractors protecting medial and laterally.   The distal cutting block was then removed and the extension gap was checked with a spacer.  Extension gap was found to be appropriately sized to accommodate the spacer block.   The femoral sizing guide was then placed securely into the posterior condyles of the femur and the femoral size was measured and determined to be 8.  The size 8; 4-in-1 cutting guide was placed in position and secured with 2 pins.  The anterior posterior and chamfer resections were then performed with an oscillating saw.  Bony fragments and osteophytes were then removed.  Using a lamina spreader the posterior medial and lateral condyles were checked for additional osteophytes and posterior soft tissue remnants.  Any remaining meniscus was removed at this time.  Periarticular injection was performed in the meniscal rims and posterior capsule with aspiration performed to ensure no intravascular injection.   The tibia was then exposed and the tibial trial was pinned onto the plateau after confirming appropriate orientation and rotation.  Using the drill bushing the tibia was prepared to the appropriate drill depth.  Tibial broach impactor was then driven through the punch guide using a mallet.  The femoral trial  component was then inserted onto the femur. A trial tibial polyethylene bearing was then placed and the knee was reduced.  The knee achieved full extension with no hyperextension and was found to be balanced in flexion and extension with the trials in place.  The knee was then brought into full extension the patella was everted and held with 2 Kocher clamps.  The articular surface of the patella was then resected with an patella reamer and saw after careful measurement with a caliper.  The patella was then prepared with the drill guide and a trial patella was placed.  The knee was then taken through range of motion and it was found that the patella articulated appropriately with the trochlea and good patellofemoral motion without subluxation.    The correct final components for implantation were confirmed and opened by the circulator nurse.  The knee was irrigated with normal saline via pulsatile lavage to remove any bony debris or soft tissue.  The prepared surface of the tibia was exposed and the tibial component was implanted with good bony contact.  The femoral component was then placed and impacted showing good coverage and a good snug fit.  The patella was then cleared off and the patella compression tool was used to apply the patellar component with symmetric compression onto the patella.  The tibial component was then irrigated and cleared of  any debris and a real polyethylene component was placed and engaged with the locking mechanism.  The knee was then injected with the particular cocktail.  The knee was taken through range of motion and found to be stable in flexion and extension with patellar tracking.  The knee was then irrigated with copious amount of normal saline via pulsatile lavage to remove all loose bodies and other debris.  The knee was then irrigated with surgiphor betadine based wash and reirrigated with saline.  The tourniquet was then dropped and all bleeding vessels were identified and  coagulated.  The arthrotomy was approximated with #1 Vicryl and closed with #1 Stratafix suture.  The knee was brought into slight flexion and the subcutaneous tissues were closed with 0 Vicryl, 2-0 Vicryl and a running subcuticular 4-0 stratafix barbed suture.  Skin was then glued with Dermabond.  A sterile adhesive dressing was then placed along with a sequential compression device to the calf, a Ted stocking, and a cryotherapy cuff.   Sponge, needle, and Lap counts were all correct at the end of the case.   The patient was transferred off of the operating room table to a hospital bed, good pulses were found distally on the operative side.  The patient was transferred to the recovery room in stable condition.

## 2023-12-15 NOTE — Progress Notes (Signed)
 Patient is not able to walk the distance required to go the bathroom, or she is unable to safely negotiate stairs required to access the bathroom.  A 3in1 BSC will alleviate this problem.       Lollie Marrow, PA-C [facility]

## 2023-12-16 ENCOUNTER — Encounter: Payer: Self-pay | Admitting: Orthopedic Surgery

## 2023-12-16 DIAGNOSIS — M1712 Unilateral primary osteoarthritis, left knee: Secondary | ICD-10-CM | POA: Diagnosis not present

## 2023-12-16 LAB — CBC
HCT: 36.2 % (ref 36.0–46.0)
Hemoglobin: 12.1 g/dL (ref 12.0–15.0)
MCH: 31.2 pg (ref 26.0–34.0)
MCHC: 33.4 g/dL (ref 30.0–36.0)
MCV: 93.3 fL (ref 80.0–100.0)
Platelets: 235 K/uL (ref 150–400)
RBC: 3.88 MIL/uL (ref 3.87–5.11)
RDW: 11.6 % (ref 11.5–15.5)
WBC: 14.4 K/uL — ABNORMAL HIGH (ref 4.0–10.5)
nRBC: 0 % (ref 0.0–0.2)

## 2023-12-16 LAB — BASIC METABOLIC PANEL WITH GFR
Anion gap: 3 — ABNORMAL LOW (ref 5–15)
BUN: 15 mg/dL (ref 8–23)
CO2: 22 mmol/L (ref 22–32)
Calcium: 8.4 mg/dL — ABNORMAL LOW (ref 8.9–10.3)
Chloride: 113 mmol/L — ABNORMAL HIGH (ref 98–111)
Creatinine, Ser: 1.33 mg/dL — ABNORMAL HIGH (ref 0.44–1.00)
GFR, Estimated: 43 mL/min — ABNORMAL LOW (ref >60)
Glucose, Bld: 116 mg/dL — ABNORMAL HIGH (ref 70–99)
Potassium: 4.1 mmol/L (ref 3.5–5.1)
Sodium: 138 mmol/L (ref 135–145)

## 2023-12-16 MED ORDER — OXYCODONE HCL 5 MG PO TABS: 2.5000 mg | ORAL_TABLET | Freq: Three times a day (TID) | ORAL | 0 refills | Status: DC | PRN

## 2023-12-16 MED ORDER — ONDANSETRON HCL 4 MG PO TABS: 4.0000 mg | ORAL_TABLET | Freq: Four times a day (QID) | ORAL | 0 refills | Status: DC | PRN

## 2023-12-16 MED ORDER — ENOXAPARIN SODIUM 40 MG/0.4ML IJ SOSY: 40.0000 mg | PREFILLED_SYRINGE | INTRAMUSCULAR | 0 refills | Status: DC

## 2023-12-16 MED ORDER — TRAMADOL HCL 50 MG PO TABS: 50.0000 mg | ORAL_TABLET | Freq: Four times a day (QID) | ORAL | 0 refills | Status: AC | PRN

## 2023-12-16 NOTE — Progress Notes (Signed)
 DISCHARGE NOTE:  Pt given discharge instructions, scripts  and verbalized understanding, Lovenox teaching done. [facility] sent with pt. TED hose on both legs. Pt wheeled to car by staff, sister providing transportation home.

## 2023-12-16 NOTE — Progress Notes (Signed)
  Subjective: 1 Day Post-Op Procedure(s) (LRB): ARTHROPLASTY, KNEE, TOTAL (Left) Patient reports pain as mild.   Patient is well, and has had no acute complaints or problems Plan is to go Home after hospital stay. Negative for chest pain and shortness of breath Fever: no Gastrointestinal:Negative for nausea and vomiting Reports she is passing gas this morning.  Objective: Vital signs in last 24 hours: Temp:  [97 F (36.1 C)-98.6 F (37 C)] 98.6 F (37 C) (10/09 0550) Pulse Rate:  [56-100] 69 (10/09 0550) Resp:  [12-20] 16 (10/09 0550) BP: (116-166)/(63-97) 164/84 (10/09 0550) SpO2:  [96 %-100 %] 98 % (10/09 0550)  Intake/Output from previous day:  Intake/Output Summary (Last 24 hours) at 12/16/2023 0708 Last data filed at 12/15/2023 2252 Gross per 24 hour  Intake 1331.08 ml  Output 200 ml  Net 1131.08 ml    Intake/Output this shift: No intake/output data recorded.  Labs: Recent Labs    12/16/23 0536  HGB 12.1   Recent Labs    12/16/23 0536  WBC 14.4*  RBC 3.88  HCT 36.2  PLT 235   Recent Labs    12/16/23 0536  NA 138  K 4.1  CL 113*  CO2 22  BUN 15  CREATININE 1.33*  GLUCOSE 116*  CALCIUM  8.4*   No results for input(s): LABPT, INR in the last 72 hours.  EXAM General - Patient is Alert, Appropriate, and Oriented Extremity - ABD soft Neurovascular intact Dorsiflexion/Plantar flexion intact Incision: dressing C/D/I No cellulitis present Dressing/Incision - clean, dry, no drainage noted to the left hip honeycomb dressing. Motor Function - intact, moving foot and toes well on exam.  Abdomen soft with intact bowel sounds this AM.  Past Medical History:  Diagnosis Date   Allergy    Hyperlipidemia    Hypertension    Osteoarthritis    Vasovagal syncope 10/24/2014    Assessment/Plan: 1 Day Post-Op Procedure(s) (LRB): ARTHROPLASTY, KNEE, TOTAL (Left) Principal Problem:   S/P TKR (total knee replacement), left  Estimated body mass index is  28.27 kg/m as calculated from the following:   Height as of 12/07/23: 5' 4 (1.626 m).   Weight as of 12/07/23: 74.7 kg. Advance diet Up with therapy D/C IV fluids when tolerating po intake.  Labs and vitals reviewed this AM. WBC 14.4, Hg 12.1 this morning. Continue to work on a BM. Up with therapy today. Plan for d/c home today pending progress with PT.  DVT Prophylaxis - Lovenox and TED hose Weight-Bearing as tolerated to left leg  J. Gustavo Level, PA-C [facility] 12/16/2023, 7:08 AM

## 2023-12-16 NOTE — Evaluation (Signed)
 Physical Therapy Evaluation Patient Details Name: Heidi Powell MRN: [id number] DOB: 1950-11-27 Today's Date: 12/16/2023  History of Present Illness  73 y/o female s/p L TKA 10/8.  Clinical Impression  Pt did well with PT exam and subsequent gait training and exercises.  She was initially having some hesitancy but with cuing and encouargement she show increased confidence and comfort with activity.  Pt overall showed POD1 effort with ROM >80, able to ambulate ~200 ft with walker, negotiated up/down steps and ultimately showed good safety and ability to manage at home with help from her sister.  Pt will benefit from continued PT to address functional limitations per TKA protocol.       If plan is discharge home, recommend the following: A little help with walking and/or transfers;A little help with bathing/dressing/bathroom;Assistance with cooking/housework;Help with stairs or ramp for entrance;Assist for transportation   Can travel by private vehicle        Equipment Recommendations BSC/3in1 (has borrowed a walker)  Recommendations for Other Services       Functional Status Assessment Patient has had a recent decline in their functional status and demonstrates the ability to make significant improvements in function in a reasonable and predictable amount of time.     Precautions / Restrictions Precautions Precautions: Fall;Knee Precaution Booklet Issued: Yes (comment) (HEP) Recall of Precautions/Restrictions: Intact Restrictions Weight Bearing Restrictions Per Provider Order: Yes LLE Weight Bearing Per Provider Order: Weight bearing as tolerated      Mobility  Bed Mobility Overal bed mobility: Modified Independent             General bed mobility comments: labored effort, but able to get up to sitting EOB w/o assist or use of bed rails    Transfers Overall transfer level: Needs assistance Equipment used: Rolling walker (2 wheels) Transfers: Sit to/from  Stand Sit to Stand: Contact guard assist           General transfer comment: cuing for appropriate sequencing, set up, weight shift but able to rise w/o direct assist from standard height bed    Ambulation/Gait Ambulation/Gait assistance: Contact guard assist Gait Distance (Feet): 200 Feet Assistive device: Rolling walker (2 wheels)         General Gait Details: Pt showed great effort with prolonged bout of ambulation, showed ability to do >200 ft w/o rest break and generally showed improved confidencen and cadence t/o the effort.  Stairs Stairs: Yes Stairs assistance: Contact guard assist Stair Management: No rails, Backwards, With walker Number of Stairs: 4 General stair comments: After edu/explanation on appropriate strategy w/o rails pt was able to negotiate w/o assist and only minimal cuing, ultimately did quite well  Wheelchair Mobility     Tilt Bed    Modified Rankin (Stroke Patients Only)       Balance Overall balance assessment: Modified Independent                                           Pertinent Vitals/Pain Pain Assessment Pain Assessment: 0-10 Pain Score: 5  Pain Location: L knee Pain Intervention(s): RN gave pain meds during session    Home Living Family/patient expects to be discharged to:: Private residence Living Arrangements: Alone Available Help at Discharge: Family;Available 24 hours/day (sister will be staying with her 24/7 initially)   Home Access: Stairs to enter Entrance Stairs-Rails: None Entrance Stairs-Number of Steps: 3  Home Equipment: Agricultural consultant (2 wheels);Shower seat - built in      Prior Function Prior Level of Function : Independent/Modified Independent             Mobility Comments: driving, running errands, taking care of grandkids ADLs Comments: independent     Extremity/Trunk Assessment   Upper Extremity Assessment Upper Extremity Assessment: Overall WFL for tasks assessed     Lower Extremity Assessment Lower Extremity Assessment: Overall WFL for tasks assessed (expected post op weakness)       Communication   Communication Communication: No apparent difficulties    Cognition Arousal: Alert Behavior During Therapy: WFL for tasks assessed/performed   PT - Cognitive impairments: No apparent impairments                         Following commands: Intact       Cueing Cueing Techniques: Verbal cues, Visual cues, Tactile cues, Gestural cues     General Comments General comments (skin integrity, edema, etc.): Pt initially with some hesitancy, but ultimately did well t/o session and showed increasing confidence with encouargement and effort    Exercises Total Joint Exercises Ankle Circles/Pumps: AROM, 15 reps Quad Sets: PROM, 10 reps Short Arc Quad: AROM, Strengthening, 10 reps Heel Slides: AROM, Strengthening, 10 reps (with resisted leg ext) Hip ABduction/ADduction: Strengthening, 10 reps, AROM Straight Leg Raises: AROM, 10 reps Knee Flexion: PROM, 5 reps Goniometric ROM: 2-81 AROM (PROM 1-84)   Assessment/Plan    PT Assessment Patient needs continued PT services  PT Problem List Decreased strength;Decreased range of motion;Decreased activity tolerance;Decreased balance;Decreased mobility;Decreased safety awareness;Decreased knowledge of use of DME;Pain       PT Treatment Interventions DME instruction;Stair training;Gait training;Functional mobility training;Therapeutic activities;Therapeutic exercise;Balance training;Patient/family education    PT Goals (Current goals can be found in the Care Plan section)  Acute Rehab PT Goals Patient Stated Goal: go home PT Goal Formulation: With patient Time For Goal Achievement: 12/29/23 Potential to Achieve Goals: Good    Frequency BID     Co-evaluation               AM-PAC PT 6 Clicks Mobility  Outcome Measure Help needed turning from your back to your side while in a flat  bed without using bedrails?: A Little Help needed moving from lying on your back to sitting on the side of a flat bed without using bedrails?: A Little Help needed moving to and from a bed to a chair (including a wheelchair)?: A Little Help needed standing up from a chair using your arms (e.g., wheelchair or bedside chair)?: A Little Help needed to walk in hospital room?: A Little Help needed climbing 3-5 steps with a railing? : A Little 6 Click Score: 18    End of Session Equipment Utilized During Treatment: Gait belt Activity Tolerance: Patient tolerated treatment well Patient left: in chair;with call bell/phone within reach;with family/visitor present Nurse Communication: Mobility status PT Visit Diagnosis: Muscle weakness (generalized) (M62.81);Difficulty in walking, not elsewhere classified (R26.2);Pain Pain - Right/Left: Left Pain - part of body: Knee    Time: 0818-0906 PT Time Calculation (min) (ACUTE ONLY): 48 min   Charges:   PT Evaluation $PT Eval Low Complexity: 1 Low PT Treatments $Gait Training: 8-22 mins $Therapeutic Exercise: 8-22 mins PT General Charges $$ ACUTE PT VISIT: 1 Visit         Carmin JONELLE Deed, DPT 12/16/2023, 10:40 AM

## 2023-12-17 DIAGNOSIS — M1712 Unilateral primary osteoarthritis, left knee: Secondary | ICD-10-CM | POA: Diagnosis not present

## 2023-12-17 DIAGNOSIS — M2392 Unspecified internal derangement of left knee: Secondary | ICD-10-CM | POA: Diagnosis not present

## 2023-12-17 NOTE — Anesthesia Postprocedure Evaluation (Signed)
 Anesthesia Post Note  Patient: Heidi Powell  Procedure(s) Performed: ARTHROPLASTY, KNEE, TOTAL (Left: Knee)  Patient location during evaluation: Nursing Unit Anesthesia Type: Spinal Level of consciousness: oriented and awake and alert Pain management: pain level controlled Vital Signs Assessment: post-procedure vital signs reviewed and stable Respiratory status: spontaneous breathing and respiratory function stable Cardiovascular status: blood pressure returned to baseline and stable Postop Assessment: no headache, no backache, no apparent nausea or vomiting and patient able to bend at knees Anesthetic complications: no   There were no known notable events for this encounter.   Last Vitals:  Vitals:   12/16/23 0749 12/16/23 1133  BP: (!) 153/78 (!) 156/78  Pulse: 67 61  Resp: 18 17  Temp: 36.6 C 36.7 C  SpO2: 97% 98%    Last Pain:  Vitals:   12/16/23 1133  TempSrc: Oral  PainSc:                  Debby Mines

## 2023-12-29 DIAGNOSIS — R262 Difficulty in walking, not elsewhere classified: Secondary | ICD-10-CM | POA: Diagnosis not present

## 2023-12-29 DIAGNOSIS — Z96652 Presence of left artificial knee joint: Secondary | ICD-10-CM | POA: Diagnosis not present

## 2023-12-29 DIAGNOSIS — M25562 Pain in left knee: Secondary | ICD-10-CM | POA: Diagnosis not present

## 2023-12-31 DIAGNOSIS — R262 Difficulty in walking, not elsewhere classified: Secondary | ICD-10-CM | POA: Diagnosis not present

## 2023-12-31 DIAGNOSIS — Z96652 Presence of left artificial knee joint: Secondary | ICD-10-CM | POA: Diagnosis not present

## 2023-12-31 DIAGNOSIS — M25562 Pain in left knee: Secondary | ICD-10-CM | POA: Diagnosis not present

## 2024-01-03 DIAGNOSIS — R262 Difficulty in walking, not elsewhere classified: Secondary | ICD-10-CM | POA: Diagnosis not present

## 2024-01-03 DIAGNOSIS — M25562 Pain in left knee: Secondary | ICD-10-CM | POA: Diagnosis not present

## 2024-01-03 DIAGNOSIS — Z96652 Presence of left artificial knee joint: Secondary | ICD-10-CM | POA: Diagnosis not present

## 2024-01-03 NOTE — Progress Notes (Addendum)
 Bena Kobel                                          MRN: [id number]   01/03/2024   The [facility] Quality Team Specialist reviewed this patient medical record for the purposes of chart review for care gap closure. The following were reviewed: chart review for care gap closure-colorectal cancer screening and controlling blood pressure.  02/28/2024- CBP out of range  03/22/2024- ABSTRACTED COL  04/04/2024- last cbp out of range    [facility] Quality Team

## 2024-01-05 DIAGNOSIS — R262 Difficulty in walking, not elsewhere classified: Secondary | ICD-10-CM | POA: Diagnosis not present

## 2024-01-05 DIAGNOSIS — Z96652 Presence of left artificial knee joint: Secondary | ICD-10-CM | POA: Diagnosis not present

## 2024-01-05 DIAGNOSIS — M25562 Pain in left knee: Secondary | ICD-10-CM | POA: Diagnosis not present

## 2024-01-06 ENCOUNTER — Ambulatory Visit: Payer: Self-pay | Admitting: *Deleted

## 2024-01-06 ENCOUNTER — Ambulatory Visit (INDEPENDENT_AMBULATORY_CARE_PROVIDER_SITE_OTHER): Admitting: Family Medicine

## 2024-01-06 VITALS — BP 146/82 | HR 81 | Ht 64.0 in | Wt 163.0 lb

## 2024-01-06 DIAGNOSIS — H532 Diplopia: Secondary | ICD-10-CM

## 2024-01-06 NOTE — Progress Notes (Unsigned)
   Acute Office Visit  Subjective:     Patient ID: Heidi Powell, female    DOB: 07/14/1950, 73 y.o.   MRN: [id number]  Chief Complaint  Patient presents with   Blurred Vision   Dizziness    HPI Patient is in today for ***  ROS      Objective:    Ht 5' 4 (1.626 m)   BMI 28.27 kg/m  {Vitals History (Optional):23777}  Physical Exam  No results found for any visits on 01/06/24.      Assessment & Plan:   Problem List Items Addressed This Visit   None   No orders of the defined types were placed in this encounter.   No follow-ups on file.  Vinary K Firas Guardado, MD

## 2024-01-06 NOTE — Telephone Encounter (Signed)
 FYI Only or Action Required?: FYI only for provider: appointment scheduled on 10/30.  Patient was last seen in primary care on 11/30/2023 by Justus Leita DEL, MD.  Called Nurse Triage reporting Dizziness.  Symptoms began today.  Interventions attempted: Nothing.  Symptoms are: gradually improving.  Triage Disposition: See PCP When Office is Open (Within 3 Days)  Patient/caregiver understands and will follow disposition?: Yes  Copied from CRM [id number]. Topic: Clinical - Red Word Triage >> Jan 06, 2024 11:07 AM Heidi Powell wrote: Red Word that prompted transfer to Nurse Triage: Blurred vision, dizzy, currently with EMT right now. Vision issues just came on. Had surgery 3 weeks ago. Feeling a little better.    ----------------------------------------------------------------------- From previous Reason for Contact - Scheduling: Patient/patient representative is calling to schedule an appointment. Refer to attachments for appointment information. Reason for Disposition  [1] Brief (now gone) blurred vision AND [2] unexplained  [1] MODERATE dizziness (e.g., interferes with normal activities) AND [2] has been evaluated by doctor (or NP/PA) for this  Answer Assessment - Initial Assessment Questions 1. DESCRIPTION: How has your vision changed? (e.g., complete vision loss, blurred vision, double vision, floaters, etc.)     Blurred vision, dizziness 2. LOCATION: One or both eyes? If one, ask: Which eye?     both 3. SEVERITY: Can you see anything? If Yes, ask: What can you see? (e.g., fine print)     Getting better now- seeing clearly 4. ONSET: When did this begin? Did it start suddenly or has this been gradual?     This morning 10am- EMS was called 5. PATTERN: Does this come and go, or has it been constant since it started?     Just this episode 6. PAIN: Is there any pain in your eye(s)?  (Scale 1-10; or mild, moderate, severe)     No pain 7. CONTACTS-GLASSES: Do you  wear contacts or glasses?     glasses 8. CAUSE: What do you think is causing this visual problem?     Unsure- blurred vision after standing to sitting 9. OTHER SYMPTOMS: Do you have any other symptoms? (e.g., confusion, headache, arm or leg weakness, speech problems)     dizziness EMS- has checked all vitls and they are normal  Answer Assessment - Initial Assessment Questions 1. DESCRIPTION: Describe your dizziness.     Dizziness with blurred vision when going from standing to sitting today 2. LIGHTHEADED: Do you feel lightheaded? (e.g., somewhat faint, woozy, weak upon standing)     Somewhat faint- not anymore  5. ONSET:  When did the dizziness begin?     One episode - today  7. HEART RATE: Can you tell me your heart rate? How many beats in 15 seconds?  (Note: Not all patients can do this.)       EMS were called and all vitals are normal 8. CAUSE: What do you think is causing the dizziness? (e.g., decreased fluids or food, diarrhea, emotional distress, heat exposure, new medicine, sudden standing, vomiting; unknown)     unsure  Protocols used: Dizziness - Lightheadedness-A-AH, Vision Loss or Change-A-AH

## 2024-01-07 ENCOUNTER — Ambulatory Visit: Admitting: Internal Medicine

## 2024-01-09 ENCOUNTER — Other Ambulatory Visit: Payer: Self-pay | Admitting: Internal Medicine

## 2024-01-09 DIAGNOSIS — E782 Mixed hyperlipidemia: Secondary | ICD-10-CM

## 2024-01-09 DIAGNOSIS — I10 Essential (primary) hypertension: Secondary | ICD-10-CM

## 2024-01-10 DIAGNOSIS — M25562 Pain in left knee: Secondary | ICD-10-CM | POA: Diagnosis not present

## 2024-01-10 DIAGNOSIS — Z96652 Presence of left artificial knee joint: Secondary | ICD-10-CM | POA: Diagnosis not present

## 2024-01-10 DIAGNOSIS — R262 Difficulty in walking, not elsewhere classified: Secondary | ICD-10-CM | POA: Diagnosis not present

## 2024-01-11 NOTE — Telephone Encounter (Signed)
 Requested Prescriptions  Pending Prescriptions Disp Refills   atorvastatin  (LIPITOR) 10 MG tablet [Pharmacy Med Name: ATORVASTATIN  10 MG TABLET] 90 tablet 3    Sig: TAKE 1 TABLET BY MOUTH EVERY DAY     Cardiovascular:  Antilipid - Statins Failed - 01/11/2024 12:59 PM      Failed - Lipid Panel in normal range within the last 12 months    Cholesterol, Total  Date Value Ref Range Status  02/16/2023 204 (H) 100 - 199 mg/dL Final   LDL Chol Calc (NIH)  Date Value Ref Range Status  02/16/2023 123 (H) 0 - 99 mg/dL Final   HDL  Date Value Ref Range Status  02/16/2023 52 >39 mg/dL Final   Triglycerides  Date Value Ref Range Status  02/16/2023 162 (H) 0 - 149 mg/dL Final         Passed - Patient is not pregnant      Passed - Valid encounter within last 12 months    Recent Outpatient Visits           5 days ago Diplopia   [facility] at [facility], Vinay K, MD   1 month ago Benign essential HTN   [place] [facility] at [facility], Leita DEL, MD   2 months ago Benign essential HTN   [place] [facility] Sports Medicine at [facility], Leita DEL, MD   2 months ago Benign essential HTN   [place] [facility] Medicine at [facility], Leita DEL, MD   4 months ago Internal derangement of left knee   [facility] at [facility], Selinda PARAS, MD       Future Appointments             In 1 month Justus Leita DEL, MD [facility] Sports Medicine at [facility], [phone] [place]             metoprolol  succinate (TOPROL -XL) 50 MG 24 hr tablet [Pharmacy Med Name: METOPROLOL  SUCC ER 50 MG TAB] 90 tablet 0    Sig: TAKE 1 TABLET BY MOUTH EVERY DAY     Cardiovascular:  Beta Blockers Failed - 01/11/2024 12:59 PM      Failed - Last BP in normal range    BP Readings from Last 1 Encounters:  01/06/24 (!) 146/82          Passed - Last Heart Rate in normal range    Pulse Readings from Last 1 Encounters:  01/06/24 81         Passed - Valid encounter within last 6 months    Recent Outpatient Visits           5 days ago Diplopia   [facility] at [facility], Vinay K, MD   1 month ago Benign essential HTN   [place] [facility] Medicine at [facility], Leita DEL, MD   2 months ago Benign essential HTN   [place] [facility] Medicine at [facility], Leita DEL, MD   2 months ago Benign essential HTN   [facility] Medicine at [facility], Leita DEL, MD   4 months ago Internal derangement of left knee   [facility] Medicine at [facility], Selinda PARAS, MD  Future Appointments             In 1 month Justus, Leita DEL, MD [facility] Primary Care & Sports Medicine at [facility], [phone] [place]

## 2024-01-12 DIAGNOSIS — Z96652 Presence of left artificial knee joint: Secondary | ICD-10-CM | POA: Diagnosis not present

## 2024-01-12 DIAGNOSIS — R262 Difficulty in walking, not elsewhere classified: Secondary | ICD-10-CM | POA: Diagnosis not present

## 2024-01-12 DIAGNOSIS — M25562 Pain in left knee: Secondary | ICD-10-CM | POA: Diagnosis not present

## 2024-01-17 DIAGNOSIS — R262 Difficulty in walking, not elsewhere classified: Secondary | ICD-10-CM | POA: Diagnosis not present

## 2024-01-17 DIAGNOSIS — M25562 Pain in left knee: Secondary | ICD-10-CM | POA: Diagnosis not present

## 2024-01-17 DIAGNOSIS — Z96652 Presence of left artificial knee joint: Secondary | ICD-10-CM | POA: Diagnosis not present

## 2024-01-18 DIAGNOSIS — Z1211 Encounter for screening for malignant neoplasm of colon: Secondary | ICD-10-CM | POA: Diagnosis not present

## 2024-01-18 LAB — FECAL OCCULT BLOOD, IMMUNOCHEMICAL: IFOBT: NEGATIVE

## 2024-01-18 LAB — FECAL OCCULT BLOOD, GUAIAC: Fecal Occult Blood: NEGATIVE

## 2024-01-24 DIAGNOSIS — M25562 Pain in left knee: Secondary | ICD-10-CM | POA: Diagnosis not present

## 2024-01-24 DIAGNOSIS — Z96652 Presence of left artificial knee joint: Secondary | ICD-10-CM | POA: Diagnosis not present

## 2024-01-24 DIAGNOSIS — R262 Difficulty in walking, not elsewhere classified: Secondary | ICD-10-CM | POA: Diagnosis not present

## 2024-01-26 DIAGNOSIS — Z96652 Presence of left artificial knee joint: Secondary | ICD-10-CM | POA: Diagnosis not present

## 2024-01-27 DIAGNOSIS — Z96652 Presence of left artificial knee joint: Secondary | ICD-10-CM | POA: Diagnosis not present

## 2024-01-27 DIAGNOSIS — R262 Difficulty in walking, not elsewhere classified: Secondary | ICD-10-CM | POA: Diagnosis not present

## 2024-01-27 DIAGNOSIS — M25562 Pain in left knee: Secondary | ICD-10-CM | POA: Diagnosis not present

## 2024-01-31 ENCOUNTER — Other Ambulatory Visit: Payer: Self-pay | Admitting: Internal Medicine

## 2024-01-31 DIAGNOSIS — M25562 Pain in left knee: Secondary | ICD-10-CM | POA: Diagnosis not present

## 2024-01-31 DIAGNOSIS — I10 Essential (primary) hypertension: Secondary | ICD-10-CM

## 2024-01-31 DIAGNOSIS — R262 Difficulty in walking, not elsewhere classified: Secondary | ICD-10-CM | POA: Diagnosis not present

## 2024-01-31 DIAGNOSIS — Z96652 Presence of left artificial knee joint: Secondary | ICD-10-CM | POA: Diagnosis not present

## 2024-02-01 NOTE — Telephone Encounter (Signed)
 Requested Prescriptions  Pending Prescriptions Disp Refills   olmesartan  (BENICAR ) 20 MG tablet [Pharmacy Med Name: OLMESARTAN  MEDOXOMIL 20 MG TAB] 90 tablet 0    Sig: TAKE 1 TABLET BY MOUTH EVERY DAY     Cardiovascular:  Angiotensin Receptor Blockers Failed - 02/01/2024 12:01 PM      Failed - Cr in normal range and within 180 days    Creatinine  Date Value Ref Range Status  06/23/2014 0.93 mg/dL Final    Comment:    9.55-8.99 NOTE: New Reference Range  05/15/14    Creatinine, Ser  Date Value Ref Range Status  12/16/2023 1.33 (H) 0.44 - 1.00 mg/dL Final         Failed - Last BP in normal range    BP Readings from Last 1 Encounters:  01/06/24 (!) 146/82         Passed - K in normal range and within 180 days    Potassium  Date Value Ref Range Status  12/16/2023 4.1 3.5 - 5.1 mmol/L Final  06/23/2014 3.7 mmol/L Final    Comment:    3.5-5.1 NOTE: New Reference Range  05/15/14          Passed - Patient is not pregnant      Passed - Valid encounter within last 6 months    Recent Outpatient Visits           3 weeks ago Diplopia   [facility] Medicine at [facility], Vinay K, MD   2 months ago Benign essential HTN   [place] [facility] Medicine at [facility], Leita DEL, MD   2 months ago Benign essential HTN   [place] [facility] Medicine at [facility], Leita DEL, MD   2 months ago Benign essential HTN   [place] [facility] Medicine at [facility], Leita DEL, MD   4 months ago Internal derangement of left knee   [facility] Primary Care & Sports Medicine at [facility], Selinda PARAS, MD       Future Appointments             In 2 weeks Justus, Leita DEL, MD [facility] Primary Care & Sports Medicine at [facility], [phone] [place]

## 2024-02-02 DIAGNOSIS — M25562 Pain in left knee: Secondary | ICD-10-CM | POA: Diagnosis not present

## 2024-02-02 DIAGNOSIS — Z96652 Presence of left artificial knee joint: Secondary | ICD-10-CM | POA: Diagnosis not present

## 2024-02-02 DIAGNOSIS — R262 Difficulty in walking, not elsewhere classified: Secondary | ICD-10-CM | POA: Diagnosis not present

## 2024-02-07 ENCOUNTER — Other Ambulatory Visit: Payer: Self-pay | Admitting: Internal Medicine

## 2024-02-07 DIAGNOSIS — Z96652 Presence of left artificial knee joint: Secondary | ICD-10-CM | POA: Diagnosis not present

## 2024-02-07 DIAGNOSIS — R262 Difficulty in walking, not elsewhere classified: Secondary | ICD-10-CM | POA: Diagnosis not present

## 2024-02-07 DIAGNOSIS — M25562 Pain in left knee: Secondary | ICD-10-CM | POA: Diagnosis not present

## 2024-02-07 DIAGNOSIS — Z1231 Encounter for screening mammogram for malignant neoplasm of breast: Secondary | ICD-10-CM

## 2024-02-09 DIAGNOSIS — Z96652 Presence of left artificial knee joint: Secondary | ICD-10-CM | POA: Diagnosis not present

## 2024-02-09 DIAGNOSIS — M25562 Pain in left knee: Secondary | ICD-10-CM | POA: Diagnosis not present

## 2024-02-09 DIAGNOSIS — R262 Difficulty in walking, not elsewhere classified: Secondary | ICD-10-CM | POA: Diagnosis not present

## 2024-02-14 DIAGNOSIS — Z96652 Presence of left artificial knee joint: Secondary | ICD-10-CM | POA: Diagnosis not present

## 2024-02-14 DIAGNOSIS — R262 Difficulty in walking, not elsewhere classified: Secondary | ICD-10-CM | POA: Diagnosis not present

## 2024-02-14 DIAGNOSIS — M25562 Pain in left knee: Secondary | ICD-10-CM | POA: Diagnosis not present

## 2024-02-16 ENCOUNTER — Ambulatory Visit: Payer: Self-pay

## 2024-02-16 DIAGNOSIS — Z0000 Encounter for general adult medical examination without abnormal findings: Secondary | ICD-10-CM | POA: Diagnosis not present

## 2024-02-16 DIAGNOSIS — Z780 Asymptomatic menopausal state: Secondary | ICD-10-CM | POA: Diagnosis not present

## 2024-02-16 NOTE — Patient Instructions (Addendum)
 Ms. Shanafelt,  Thank you for taking the time for your Medicare Wellness Visit. I appreciate your continued commitment to your health goals. Please review the care plan we discussed, and feel free to reach out if I can assist you further.  Please note that Annual Wellness Visits do not include a physical exam. Some assessments may be limited, especially if the visit was conducted virtually. If needed, we may recommend an in-person follow-up with your provider.  Ongoing Care Seeing your primary care provider every 3 to 6 months helps us  monitor your health and provide consistent, personalized care.   Referrals If a referral was made during today's visit and you haven't received any updates within two weeks, please contact the referred provider directly to check on the status.  REFERRAL FOR BONE DENSITY SCAN You have an order for:  []   2D Mammogram  []   3D Mammogram  [x]   Bone Density     Please call for appointment:  [facility]  [address] [place] [address] [phone] [facility] [address] [place], [place] [address] [phone] [facility] [address] [place], [place] [address] [phone]   Make sure to wear two-piece clothing.  No lotions, powders, or deodorants the day of the appointment. Make sure to bring picture ID and insurance card.  Bring list of medications you are currently taking including any supplements.   Schedule your [place] screening mammogram through MyChart!   Log into your MyChart account.  Go to 'Visit' (or 'Appointments' if on mobile App) --> Schedule an Appointment  Under 'Select a Reason for Visit' choose the Mammogram Screening option.  Complete the pre-visit questions and select the time and place that best fits your schedule.   Recommended Screenings:  Health Maintenance  Topic Date Due   Colon  Cancer Screening  08/22/2018   COVID-19 Vaccine (3 - 2025-26 season) 11/08/2023   Osteoporosis screening with Bone Density Scan  03/05/2024   Breast Cancer Screening  03/21/2024   Flu Shot  06/06/2024*   Stool Blood Test  01/17/2025   Medicare Annual Wellness Visit  02/15/2025   DTaP/Tdap/Td vaccine (2 - Td or Tdap) 10/26/2030   Pneumococcal Vaccine for age over 78  Completed   Hepatitis C Screening  Completed   Zoster (Shingles) Vaccine  Completed   Meningitis B Vaccine  Aged Out  *Topic was postponed. The date shown is not the original due date.       02/16/2024    8:14 AM  Advanced Directives  Does Patient Have a Medical Advance Directive? Yes  Type of Estate Agent of [place];Living will  Does patient want to make changes to medical advance directive? No - Patient declined  Copy of Healthcare Power of Attorney in Chart? Yes - validated most recent copy scanned in chart (See row information)    Vision: Annual vision screenings are recommended for early detection of glaucoma, cataracts, and diabetic retinopathy. These exams can also reveal signs of chronic conditions such as diabetes and high blood pressure.  Dental: Annual dental screenings help detect early signs of oral cancer, gum disease, and other conditions linked to overall health, including heart disease and diabetes.  Please see the attached documents for additional preventive care recommendations.   NEXT AWV 03/08/25 @ 8:00 AM BY VIDEO

## 2024-02-16 NOTE — Progress Notes (Signed)
 No chief complaint on file.    Subjective:   Heidi Powell is a 73 y.o. female who presents for a Medicare Annual Wellness Visit.  Visit info / Clinical Intake: Living arrangements:: (!) (Patient-Rptd) lives alone Patient's Overall Health Status Rating: (Patient-Rptd) very good Typical amount of pain: (Patient-Rptd) some Does pain affect daily life?: (Patient-Rptd) no  Dietary Habits and Nutritional Risks How many meals a day?: (Patient-Rptd) 3 Eats fruit and vegetables daily?: (Patient-Rptd) yes Most meals are obtained by: (Patient-Rptd) preparing own meals  Functional Status Activities of Daily Living (to include ambulation/medication): (Patient-Rptd) Independent Ambulation: (Patient-Rptd) Independent Medication Administration: (Patient-Rptd) Independent Home Management (perform basic housework or laundry): (Patient-Rptd) Independent Manage your own finances?: (Patient-Rptd) yes Primary transportation is: (Patient-Rptd) driving  Fall Screening Falls in the past year?: (Patient-Rptd) 0 Number of falls in past year: 0 Was there an injury with Fall?: 0 Fall Risk Category Calculator: 0 Patient Fall Risk Level: High fall risk  Fall Risk Patient at Risk for Falls Due to: No Fall Risks Fall risk Follow up: Falls evaluation completed  Home and Transportation Safety: All rugs have non-skid backing?: (!) (Patient-Rptd) no All stairs or steps have railings?: (Patient-Rptd) yes Grab bars in the bathtub or shower?: (!) (Patient-Rptd) no Have non-skid surface in bathtub or shower?: (Patient-Rptd) yes Good home lighting?: (Patient-Rptd) yes Regular seat belt use?: (Patient-Rptd) yes Hospital stays in the last year:: (!) (Patient-Rptd) yes How many hospital stays:: (Patient-Rptd) 1  Cognitive Assessment Difficulty concentrating, remembering, or making decisions? : (Patient-Rptd) no  Advance Directives (For Healthcare) Does Patient Have a Medical Advance Directive?:  No Would patient like information on creating a medical advance directive?: No - Patient declined    Allergies (verified) Penicillin v potassium   Current Medications (verified) Outpatient Encounter Medications as of 02/16/2024  Medication Sig   atorvastatin  (LIPITOR) 10 MG tablet TAKE 1 TABLET BY MOUTH EVERY DAY   CALCIUM  PO Take 1 tablet by mouth once a week.   enoxaparin  (LOVENOX ) 40 MG/0.4ML injection Inject 0.4 mLs (40 mg total) into the skin daily.   metoprolol  succinate (TOPROL -XL) 50 MG 24 hr tablet TAKE 1 TABLET BY MOUTH EVERY DAY   Multiple Vitamin (MULTIVITAMIN WITH MINERALS) TABS tablet Take 1 tablet by mouth once a week.   Multiple Vitamin (MULTIVITAMIN) tablet Take 1 tablet by mouth daily.   olmesartan  (BENICAR ) 20 MG tablet TAKE 1 TABLET BY MOUTH EVERY DAY   oxyCODONE  (ROXICODONE ) 5 MG immediate release tablet Take 0.5-1 tablets (2.5-5 mg total) by mouth every 8 (eight) hours as needed for breakthrough pain.   traMADol  (ULTRAM ) 50 MG tablet Take 1-2 tablets (50-100 mg total) by mouth every 6 (six) hours as needed for moderate pain (pain score 4-6).   No facility-administered encounter medications on file as of 02/16/2024.    History: Past Medical History:  Diagnosis Date   Allergy    Hyperlipidemia    Hypertension    Osteoarthritis    Vasovagal syncope 10/24/2014   Past Surgical History:  Procedure Laterality Date   BREAST BIOPSY Right    neg   BREAST BIOPSY Left 04/01/2020   stereo, distortion, ribbon clip-COMPLEX SCLEROSING LESION.   BREAST BIOPSY Left 11/20/2020   Procedure: BREAST BIOPSY WITH NEEDLE LOCALIZATION;  Surgeon: Dessa Reyes ORN, MD;  Location: [facility] ORS;  Service: General;  Laterality: Left;   BREAST EXCISIONAL BIOPSY Left 11/20/2020   excision-COMPLEX SCLEROSING LESION.   COLONOSCOPY  08/21/2008   normal   FRACTURE SURGERY Right  arm no hardware   TOTAL KNEE ARTHROPLASTY Left 12/15/2023   Procedure: ARTHROPLASTY, KNEE, TOTAL;  Surgeon:  Lorelle Hussar, MD;  Location: [facility] ORS;  Service: Orthopedics;  Laterality: Left;   TUBAL LIGATION     Family History  Problem Relation Age of Onset   Hypertension Mother    Renal cancer Mother    Stroke Mother    Heart disease Father    Hypertension Father    Stroke Brother    Breast cancer Neg Hx    Social History   Occupational History   Not on file  Tobacco Use   Smoking status: Never   Smokeless tobacco: Never   Tobacco comments:    smoking cessation materials not required  Vaping Use   Vaping status: Never Used  Substance and Sexual Activity   Alcohol use: Yes    Alcohol/week: 2.0 standard drinks of alcohol    Types: 2 Glasses of wine per week   Drug use: Never   Sexual activity: Not Currently    Partners: Male    Birth control/protection: None   Tobacco Counseling Counseling given: Not Answered Tobacco comments: smoking cessation materials not required  SDOH Screenings   Food Insecurity: No Food Insecurity (01/26/2024)   [facility]  Housing: Low Risk  (01/26/2024)   Received from [facility]  Transportation Needs: No Transportation Needs (01/26/2024)   Received from [facility]  Utilities: Not At Risk (01/26/2024)   Received from [facility]  Alcohol Screen: Low Risk  (01/06/2024)  Depression (PHQ2-9): Medium Risk (11/30/2023)  Financial Resource Strain: Low Risk  (01/26/2024)   Received from [facility]  Physical Activity: Insufficiently Active (01/06/2024)  Social Connections: Moderately Isolated (01/06/2024)  Stress: No Stress Concern Present (01/06/2024)  Tobacco Use: Low Risk  (01/26/2024)   Received from [facility]  Health Literacy: Adequate Health Literacy (02/10/2023)   See flowsheets for full screening details  Depression Screen PHQ 2 & 9 Depression Scale- Over the past 2 weeks, how often have you been bothered by any  of the following problems? Little interest or pleasure in doing things: 0 Feeling down, depressed, or hopeless (PHQ Adolescent also includes...irritable): 0 PHQ-2 Total Score: 0 Trouble falling or staying asleep, or sleeping too much: 3 Feeling tired or having little energy: 3 Poor appetite or overeating (PHQ Adolescent also includes...weight loss): 0 Feeling bad about yourself - or that you are a failure or have let yourself or your family down: 0 Trouble concentrating on things, such as reading the newspaper or watching television (PHQ Adolescent also includes...like school work): 0 Moving or speaking so slowly that other people could have noticed. Or the opposite - being so fidgety or restless that you have been moving around a lot more than usual: 0 Thoughts that you would be better off dead, or of hurting yourself in some way: 0 PHQ-9 Total Score: 6 If you checked off any problems, how difficult have these problems made it for you to do your work, take care of things at home, or get along with other people?: Not difficult at all  Depression Treatment Depression Interventions/Treatment : Counseling     Goals Addressed   None          Objective:    There were no vitals filed for this visit. There is no height or weight on file to calculate BMI.  Hearing/Vision screen No results found. Immunizations and Health Maintenance  Health Maintenance  Topic Date Due   Colonoscopy  08/22/2018   COVID-19 Vaccine (3 - 2025-26 season) 11/08/2023   Medicare Annual Wellness (AWV)  02/10/2024   Mammogram  03/21/2024   Influenza Vaccine  06/06/2024 (Originally 10/08/2023)   COLON CANCER SCREENING ANNUAL FOBT  01/17/2025   DTaP/Tdap/Td (2 - Td or Tdap) 10/26/2030   Pneumococcal Vaccine: 50+ Years  Completed   Bone Density Scan  Completed   Hepatitis C Screening  Completed   Zoster Vaccines- Shingrix   Completed   Meningococcal B Vaccine  Aged Out        Assessment/Plan:  This is a  routine wellness examination for [place].  Patient Care Team: Justus Leita DEL, MD as PCP - General (Internal Medicine) Hester Wolm PARAS, MD as Consulting Physician (Cardiology) Cathlyn Seal, MD (Dermatology) Dessa Reyes ORN, MD as Consulting Physician (General Surgery) Sharron Cordella ORN, OD (Optometry) Laurice Francis NOVAK, OD [facility])  I have personally reviewed and noted the following in the patients chart:   Medical and social history Use of alcohol, tobacco or illicit drugs  Current medications and supplements including opioid prescriptions. Functional ability and status Nutritional status Physical activity Advanced directives List of other physicians Hospitalizations, surgeries, and ER visits in previous 12 months Vitals Screenings to include cognitive, depression, and falls Referrals and appointments  No orders of the defined types were placed in this encounter.  In addition, I have reviewed and discussed with patient certain preventive protocols, quality metrics, and best practice recommendations. A written personalized care plan for preventive services as well as general preventive health recommendations were provided to patient.   Heidi GORMAN Das, LPN   [id number]   No follow-ups on file.  After Visit Summary: (MyChart) Due to this being a telephonic visit, the after visit summary with patients personalized plan was offered to patient via MyChart   Nurse Notes: NEEDS FLU SHOTS; MAMMOGRAM SCHEDULED FOR 03/22/24; UTD FOBT; ORDERED BDS

## 2024-02-17 DIAGNOSIS — M25562 Pain in left knee: Secondary | ICD-10-CM | POA: Diagnosis not present

## 2024-02-17 DIAGNOSIS — R262 Difficulty in walking, not elsewhere classified: Secondary | ICD-10-CM | POA: Diagnosis not present

## 2024-02-17 DIAGNOSIS — Z96652 Presence of left artificial knee joint: Secondary | ICD-10-CM | POA: Diagnosis not present

## 2024-02-21 ENCOUNTER — Ambulatory Visit: Admitting: Internal Medicine

## 2024-02-21 ENCOUNTER — Encounter: Payer: Self-pay | Admitting: Internal Medicine

## 2024-02-21 VITALS — BP 152/88 | HR 84 | Ht 64.0 in | Wt 158.0 lb

## 2024-02-21 DIAGNOSIS — Z23 Encounter for immunization: Secondary | ICD-10-CM | POA: Diagnosis not present

## 2024-02-21 DIAGNOSIS — F5101 Primary insomnia: Secondary | ICD-10-CM | POA: Diagnosis not present

## 2024-02-21 DIAGNOSIS — Z13820 Encounter for screening for osteoporosis: Secondary | ICD-10-CM | POA: Diagnosis not present

## 2024-02-21 DIAGNOSIS — Z1211 Encounter for screening for malignant neoplasm of colon: Secondary | ICD-10-CM | POA: Diagnosis not present

## 2024-02-21 DIAGNOSIS — I10 Essential (primary) hypertension: Secondary | ICD-10-CM | POA: Diagnosis not present

## 2024-02-21 DIAGNOSIS — Z1231 Encounter for screening mammogram for malignant neoplasm of breast: Secondary | ICD-10-CM | POA: Diagnosis not present

## 2024-02-21 DIAGNOSIS — E782 Mixed hyperlipidemia: Secondary | ICD-10-CM

## 2024-02-21 DIAGNOSIS — Z0000 Encounter for general adult medical examination without abnormal findings: Secondary | ICD-10-CM

## 2024-02-21 MED ORDER — GABAPENTIN 100 MG PO CAPS: 100.0000 mg | ORAL_CAPSULE | Freq: Three times a day (TID) | ORAL | 3 refills | Status: AC

## 2024-02-21 MED ORDER — METOPROLOL SUCCINATE ER 50 MG PO TB24: 50.0000 mg | ORAL_TABLET | Freq: Every day | ORAL | 1 refills | Status: AC

## 2024-02-21 NOTE — Assessment & Plan Note (Signed)
 Dayvigo  was too expensive. She would like to try gabapentin  because she also has some knee pain.

## 2024-02-21 NOTE — Patient Instructions (Signed)
Call [facility] to schedule your bone density at [phone].

## 2024-02-21 NOTE — Assessment & Plan Note (Signed)
 LDL is  Lab Results  Component Value Date   LDLCALC 123 (H) 02/16/2023    Current medication regimen is atorvastatin . Goal LDL is < 100.

## 2024-02-21 NOTE — Progress Notes (Signed)
 Date:  02/21/2024   Name:  Heidi Powell   DOB:  1950-08-17   MRN:  [id number]   Chief Complaint: Annual Exam Heidi Powell is a 73 y.o. female who presents today for her Complete Annual Exam. She feels fairly well. She reports exercising doing PT since knee replacement. She reports she is sleeping poorly. Breast complaints none.  She is still in PT after left TKA.    Health Maintenance  Topic Date Due   Osteoporosis screening with Bone Density Scan  03/05/2024   Breast Cancer Screening  03/21/2024   COVID-19 Vaccine (3 - 2025-26 season) 03/08/2024*   Colon Cancer Screening  02/20/2029*   Stool Blood Test  01/17/2025   Medicare Annual Wellness Visit  02/15/2025   DTaP/Tdap/Td vaccine (2 - Td or Tdap) 10/26/2030   Pneumococcal Vaccine for age over 84  Completed   Flu Shot  Completed   Hepatitis C Screening  Completed   Zoster (Shingles) Vaccine  Completed   Meningitis B Vaccine  Aged Out  *Topic was postponed. The date shown is not the original due date.    Hypertension This is a chronic problem. The problem is controlled. Pertinent negatives include no chest pain, headaches, palpitations or shortness of breath. Past treatments include angiotensin blockers and beta blockers. The current treatment provides significant improvement. There is no history of kidney disease, CAD/MI or CVA.  Hyperlipidemia This is a chronic problem. The problem is controlled. Pertinent negatives include no chest pain, myalgias or shortness of breath. Current antihyperlipidemic treatment includes statins. The current treatment provides significant improvement of lipids.  Insomnia Primary symptoms: sleep disturbance, difficulty falling asleep, frequent awakening.   The problem occurs nightly. The problem is unchanged.    Review of Systems  Constitutional:  Negative for fatigue and unexpected weight change.  HENT:  Negative for trouble swallowing.   Eyes:  Negative for visual disturbance.   Respiratory:  Negative for cough, chest tightness, shortness of breath and wheezing.   Cardiovascular:  Negative for chest pain, palpitations and leg swelling.  Gastrointestinal:  Negative for abdominal pain, constipation and diarrhea.  Genitourinary:  Negative for dysuria and hematuria.  Musculoskeletal:  Negative for arthralgias and myalgias.  Skin:  Negative for color change and rash.  Neurological:  Negative for dizziness, weakness, light-headedness and headaches.  Psychiatric/Behavioral:  Positive for sleep disturbance. Negative for dysphoric mood. The patient has insomnia. The patient is not nervous/anxious.      Lab Results  Component Value Date   NA 138 12/16/2023   K 4.1 12/16/2023   CO2 22 12/16/2023   GLUCOSE 116 (H) 12/16/2023   BUN 15 12/16/2023   CREATININE 1.33 (H) 12/16/2023   CALCIUM  8.4 (L) 12/16/2023   EGFR 61 02/16/2023   GFRNONAA 43 (L) 12/16/2023   Lab Results  Component Value Date   CHOL 204 (H) 02/16/2023   HDL 52 02/16/2023   LDLCALC 123 (H) 02/16/2023   TRIG 162 (H) 02/16/2023   CHOLHDL 3.9 02/16/2023   Lab Results  Component Value Date   TSH 0.650 02/16/2023   Lab Results  Component Value Date   HGBA1C 5.6 02/12/2022   Lab Results  Component Value Date   WBC 14.4 (H) 12/16/2023   HGB 12.1 12/16/2023   HCT 36.2 12/16/2023   MCV 93.3 12/16/2023   PLT 235 12/16/2023   Lab Results  Component Value Date   ALT 16 11/05/2023   AST 20 11/05/2023   ALKPHOS 90 11/05/2023  BILITOT 0.9 11/05/2023   Lab Results  Component Value Date   VD25OH 39.9 11/06/2014     Patient Active Problem List   Diagnosis Date Noted   S/P TKR (total knee replacement), left 12/15/2023   Primary osteoarthritis of right knee 08/14/2022   Primary insomnia 04/14/2021   Radial scar of left breast 11/04/2020   Female stress incontinence 01/21/2016   Benign essential HTN 12/18/2014   Mixed hyperlipidemia 10/24/2014    Allergies[1]  Past Surgical History:   Procedure Laterality Date   BREAST BIOPSY Right    neg   BREAST BIOPSY Left 04/01/2020   stereo, distortion, ribbon clip-COMPLEX SCLEROSING LESION.   BREAST BIOPSY Left 11/20/2020   Procedure: BREAST BIOPSY WITH NEEDLE LOCALIZATION;  Surgeon: Dessa Reyes ORN, MD;  Location: [facility] ORS;  Service: General;  Laterality: Left;   BREAST EXCISIONAL BIOPSY Left 11/20/2020   excision-COMPLEX SCLEROSING LESION.   COLONOSCOPY  08/21/2008   normal   FRACTURE SURGERY Right    arm no hardware   TOTAL KNEE ARTHROPLASTY Left 12/15/2023   Procedure: ARTHROPLASTY, KNEE, TOTAL;  Surgeon: Lorelle Hussar, MD;  Location: [facility] ORS;  Service: Orthopedics;  Laterality: Left;   TUBAL LIGATION      Social History[2]   Medication list has been reviewed and updated.  Active Medications[3]     11/30/2023    1:07 PM 11/09/2023    9:05 AM 11/04/2023   10:04 AM 08/04/2023   11:00 AM  GAD 7 : Generalized Anxiety Score  Nervous, Anxious, on Edge 1 2 2  0  Control/stop worrying 1  2 0  Worry too much - different things 1 2 2  0  Trouble relaxing 0 0 0 0  Restless 0 0 0 0  Easily annoyed or irritable 0 0 0 0  Afraid - awful might happen 0 0 0 0  Total GAD 7 Score 3  6 0  Anxiety Difficulty Somewhat difficult Somewhat difficult Not difficult at all Not difficult at all       02/21/2024    8:42 AM 02/16/2024    8:20 AM 11/30/2023    1:07 PM  Depression screen PHQ 2/9  Decreased Interest 0 0 0  Down, Depressed, Hopeless 0 0 0  PHQ - 2 Score 0 0 0  Altered sleeping  0 3  Tired, decreased energy  0 3  Change in appetite  0 0  Feeling bad or failure about yourself   0 0  Trouble concentrating  0 0  Moving slowly or fidgety/restless  0 0  Suicidal thoughts  0 0  PHQ-9 Score  0 6   Difficult doing work/chores  Not difficult at all Not difficult at all     Data saved with a previous flowsheet row definition    BP Readings from Last 3 Encounters:  02/21/24 (!) 152/88  01/06/24 (!) 146/82  12/16/23  (!) 156/78    Physical Exam Vitals and nursing note reviewed.  Constitutional:      General: She is not in acute distress.    Appearance: She is well-developed.  HENT:     Head: Normocephalic and atraumatic.     Right Ear: Tympanic membrane and ear canal normal.     Left Ear: Tympanic membrane and ear canal normal.     Nose:     Right Sinus: No maxillary sinus tenderness.     Left Sinus: No maxillary sinus tenderness.  Eyes:     General: No scleral icterus.  Right eye: No discharge.        Left eye: No discharge.     Conjunctiva/sclera: Conjunctivae normal.  Neck:     Thyroid : No thyromegaly.     Vascular: No carotid bruit.  Cardiovascular:     Rate and Rhythm: Normal rate and regular rhythm.     Pulses: Normal pulses.     Heart sounds: Normal heart sounds.  Pulmonary:     Effort: Pulmonary effort is normal. No respiratory distress.     Breath sounds: No wheezing.  Abdominal:     General: Bowel sounds are normal.     Palpations: Abdomen is soft.     Tenderness: There is no abdominal tenderness.  Musculoskeletal:     Cervical back: Normal range of motion. No erythema.     Right lower leg: No edema.     Left lower leg: No edema.  Lymphadenopathy:     Cervical: No cervical adenopathy.  Skin:    General: Skin is warm and dry.     Capillary Refill: Capillary refill takes less than 2 seconds.     Findings: No rash.  Neurological:     General: No focal deficit present.     Mental Status: She is alert and oriented to person, place, and time.     Cranial Nerves: No cranial nerve deficit.     Sensory: No sensory deficit.     Deep Tendon Reflexes: Reflexes are normal and symmetric.  Psychiatric:        Attention and Perception: Attention normal.        Mood and Affect: Mood normal.     Wt Readings from Last 3 Encounters:  02/21/24 158 lb (71.7 kg)  01/06/24 163 lb (73.9 kg)  12/07/23 164 lb 11.2 oz (74.7 kg)    BP (!) 152/88   Pulse 84   Ht 5' 4 (1.626 m)    Wt 158 lb (71.7 kg)   SpO2 99%   BMI 27.12 kg/m   Assessment and Plan:  Problem List Items Addressed This Visit       Unprioritized   Mixed hyperlipidemia (Chronic)   LDL is  Lab Results  Component Value Date   LDLCALC 123 (H) 02/16/2023    Current medication regimen is atorvastatin . Goal LDL is < 100.       Relevant Medications   metoprolol  succinate (TOPROL -XL) 50 MG 24 hr tablet   Other Relevant Orders   Lipid panel   Benign essential HTN (Chronic)   Well controlled blood pressure today. Current regimen is olmesartan  and metoprolol . No medication side effects noted. Last GFR slightly decreased.        Relevant Medications   metoprolol  succinate (TOPROL -XL) 50 MG 24 hr tablet   Other Relevant Orders   CBC with Differential/Platelet   Comprehensive metabolic panel with GFR   TSH   Primary insomnia   Dayvigo  was too expensive. She would like to try gabapentin  because she also has some knee pain.      Relevant Medications   gabapentin  (NEURONTIN ) 100 MG capsule   Other Visit Diagnoses       Annual physical exam    -  Primary   declines Covid and Flu up to date on Pneumonia vaccine     Encounter for screening mammogram for breast cancer       scheduled     Colon cancer screening       negative Fecal occult in November 2025     Need for  influenza vaccination       Relevant Orders   Flu vaccine HIGH DOSE PF(Fluzone Trivalent) (Completed)     Encounter for screening for osteoporosis           No follow-ups on file.    Leita HILARIO Adie, MD [facility] [place]           [1]  Allergies Allergen Reactions   Penicillin V Potassium Rash  [2]  Social History Tobacco Use   Smoking status: Never   Smokeless tobacco: Never   Tobacco comments:    smoking cessation materials not required  Vaping Use   Vaping status: Never Used  Substance Use Topics   Alcohol use: Yes    Alcohol/week: 2.0 standard drinks of  alcohol    Types: 2 Glasses of wine per week   Drug use: Never  [3]  Current Meds  Medication Sig   atorvastatin  (LIPITOR) 10 MG tablet TAKE 1 TABLET BY MOUTH EVERY DAY   gabapentin  (NEURONTIN ) 100 MG capsule Take 1 capsule (100 mg total) by mouth 3 (three) times daily.   Multiple Vitamin (MULTIVITAMIN WITH MINERALS) TABS tablet Take 1 tablet by mouth once a week.   olmesartan  (BENICAR ) 20 MG tablet TAKE 1 TABLET BY MOUTH EVERY DAY   traMADol  (ULTRAM ) 50 MG tablet Take 1-2 tablets (50-100 mg total) by mouth every 6 (six) hours as needed for moderate pain (pain score 4-6).   [DISCONTINUED] metoprolol  succinate (TOPROL -XL) 50 MG 24 hr tablet TAKE 1 TABLET BY MOUTH EVERY DAY

## 2024-02-21 NOTE — Assessment & Plan Note (Signed)
 Well controlled blood pressure today. Current regimen is olmesartan  and metoprolol . No medication side effects noted. Last GFR slightly decreased.

## 2024-02-22 ENCOUNTER — Other Ambulatory Visit: Payer: Self-pay | Admitting: Internal Medicine

## 2024-02-22 ENCOUNTER — Ambulatory Visit: Payer: Self-pay | Admitting: Internal Medicine

## 2024-02-22 DIAGNOSIS — E782 Mixed hyperlipidemia: Secondary | ICD-10-CM

## 2024-02-22 LAB — CBC WITH DIFFERENTIAL/PLATELET
Basophils Absolute: 0.1 x10E3/uL (ref 0.0–0.2)
Basos: 1 %
EOS (ABSOLUTE): 0.1 x10E3/uL (ref 0.0–0.4)
Eos: 2 %
Hematocrit: 40.5 % (ref 34.0–46.6)
Hemoglobin: 13 g/dL (ref 11.1–15.9)
Immature Grans (Abs): 0 x10E3/uL (ref 0.0–0.1)
Immature Granulocytes: 0 %
Lymphocytes Absolute: 1.8 x10E3/uL (ref 0.7–3.1)
Lymphs: 19 %
MCH: 30 pg (ref 26.6–33.0)
MCHC: 32.1 g/dL (ref 31.5–35.7)
MCV: 93 fL (ref 79–97)
Monocytes Absolute: 0.5 x10E3/uL (ref 0.1–0.9)
Monocytes: 6 %
Neutrophils Absolute: 7 x10E3/uL (ref 1.4–7.0)
Neutrophils: 72 %
Platelets: 275 x10E3/uL (ref 150–450)
RBC: 4.34 x10E6/uL (ref 3.77–5.28)
RDW: 12.1 % (ref 11.7–15.4)
WBC: 9.6 x10E3/uL (ref 3.4–10.8)

## 2024-02-22 LAB — COMPREHENSIVE METABOLIC PANEL WITH GFR
ALT: 14 IU/L (ref 0–32)
AST: 20 IU/L (ref 0–40)
Albumin: 4.6 g/dL (ref 3.8–4.8)
Alkaline Phosphatase: 132 IU/L (ref 49–135)
BUN/Creatinine Ratio: 15 (ref 12–28)
BUN: 19 mg/dL (ref 8–27)
Bilirubin Total: 0.4 mg/dL (ref 0.0–1.2)
CO2: 19 mmol/L — ABNORMAL LOW (ref 20–29)
Calcium: 9.8 mg/dL (ref 8.7–10.3)
Chloride: 104 mmol/L (ref 96–106)
Creatinine, Ser: 1.24 mg/dL — ABNORMAL HIGH (ref 0.57–1.00)
Globulin, Total: 2.3 g/dL (ref 1.5–4.5)
Glucose: 107 mg/dL — ABNORMAL HIGH (ref 70–99)
Potassium: 4.2 mmol/L (ref 3.5–5.2)
Sodium: 142 mmol/L (ref 134–144)
Total Protein: 6.9 g/dL (ref 6.0–8.5)
eGFR: 47 mL/min/1.73 — ABNORMAL LOW (ref >59)

## 2024-02-22 LAB — TSH: TSH: 0.39 u[IU]/mL — ABNORMAL LOW (ref 0.450–4.500)

## 2024-02-22 LAB — LIPID PANEL
Chol/HDL Ratio: 3.9 ratio (ref 0.0–4.4)
Cholesterol, Total: 200 mg/dL — ABNORMAL HIGH (ref 100–199)
HDL: 51 mg/dL (ref >39)
LDL Chol Calc (NIH): 116 mg/dL — ABNORMAL HIGH (ref 0–99)
Triglycerides: 190 mg/dL — ABNORMAL HIGH (ref 0–149)
VLDL Cholesterol Cal: 33 mg/dL (ref 5–40)

## 2024-02-22 MED ORDER — ATORVASTATIN CALCIUM 20 MG PO TABS: 20.0000 mg | ORAL_TABLET | Freq: Every day | ORAL | 1 refills | Status: AC

## 2024-02-22 NOTE — Progress Notes (Unsigned)
 Date:  02/22/2024   Name:  Heidi Powell   DOB:  Mar 18, 1950   MRN:  [id number]   Chief Complaint: No chief complaint on file.  HPI  Review of Systems   Lab Results  Component Value Date   NA 142 02/21/2024   K 4.2 02/21/2024   CO2 19 (L) 02/21/2024   GLUCOSE 107 (H) 02/21/2024   BUN 19 02/21/2024   CREATININE 1.24 (H) 02/21/2024   CALCIUM  9.8 02/21/2024   EGFR 47 (L) 02/21/2024   GFRNONAA 43 (L) 12/16/2023   Lab Results  Component Value Date   CHOL 200 (H) 02/21/2024   HDL 51 02/21/2024   LDLCALC 116 (H) 02/21/2024   TRIG 190 (H) 02/21/2024   CHOLHDL 3.9 02/21/2024   Lab Results  Component Value Date   TSH 0.390 (L) 02/21/2024   Lab Results  Component Value Date   HGBA1C 5.6 02/12/2022   Lab Results  Component Value Date   WBC 9.6 02/21/2024   HGB 13.0 02/21/2024   HCT 40.5 02/21/2024   MCV 93 02/21/2024   PLT 275 02/21/2024   Lab Results  Component Value Date   ALT 14 02/21/2024   AST 20 02/21/2024   ALKPHOS 132 02/21/2024   BILITOT 0.4 02/21/2024   Lab Results  Component Value Date   VD25OH 39.9 11/06/2014     Patient Active Problem List   Diagnosis Date Noted   S/P TKR (total knee replacement), left 12/15/2023   Primary osteoarthritis of right knee 08/14/2022   Primary insomnia 04/14/2021   Radial scar of left breast 11/04/2020   Female stress incontinence 01/21/2016   Benign essential HTN 12/18/2014   Mixed hyperlipidemia 10/24/2014    Allergies[1]  Past Surgical History:  Procedure Laterality Date   BREAST BIOPSY Right    neg   BREAST BIOPSY Left 04/01/2020   stereo, distortion, ribbon clip-COMPLEX SCLEROSING LESION.   BREAST BIOPSY Left 11/20/2020   Procedure: BREAST BIOPSY WITH NEEDLE LOCALIZATION;  Surgeon: Dessa Reyes ORN, MD;  Location: [facility];  Service: General;  Laterality: Left;   BREAST EXCISIONAL BIOPSY Left 11/20/2020   excision-COMPLEX SCLEROSING LESION.   COLONOSCOPY  08/21/2008   normal   FRACTURE  SURGERY Right    arm no hardware   TOTAL KNEE ARTHROPLASTY Left 12/15/2023   Procedure: ARTHROPLASTY, KNEE, TOTAL;  Surgeon: Lorelle Hussar, MD;  Location: [facility];  Service: Orthopedics;  Laterality: Left;   TUBAL LIGATION      Social History[2]   Medication list has been reviewed and updated.  Active Medications[3]     11/30/2023    1:07 PM 11/09/2023    9:05 AM 11/04/2023   10:04 AM 08/04/2023   11:00 AM  GAD 7 : Generalized Anxiety Score  Nervous, Anxious, on Edge 1 2 2  0  Control/stop worrying 1  2 0  Worry too much - different things 1 2 2  0  Trouble relaxing 0 0 0 0  Restless 0 0 0 0  Easily annoyed or irritable 0 0 0 0  Afraid - awful might happen 0 0 0 0  Total GAD 7 Score 3  6 0  Anxiety Difficulty Somewhat difficult Somewhat difficult Not difficult at all Not difficult at all       02/21/2024    8:42 AM 02/16/2024    8:20 AM 11/30/2023    1:07 PM  Depression screen PHQ 2/9  Decreased Interest 0 0 0  Down, Depressed, Hopeless 0 0 0  PHQ -  2 Score 0 0 0  Altered sleeping  0 3  Tired, decreased energy  0 3  Change in appetite  0 0  Feeling bad or failure about yourself   0 0  Trouble concentrating  0 0  Moving slowly or fidgety/restless  0 0  Suicidal thoughts  0 0  PHQ-9 Score  0 6   Difficult doing work/chores  Not difficult at all Not difficult at all     Data saved with a previous flowsheet row definition    BP Readings from Last 3 Encounters:  02/21/24 (!) 152/88  01/06/24 (!) 146/82  12/16/23 (!) 156/78    Physical Exam  Wt Readings from Last 3 Encounters:  02/21/24 158 lb (71.7 kg)  01/06/24 163 lb (73.9 kg)  12/07/23 164 lb 11.2 oz (74.7 kg)    There were no vitals taken for this visit.  Assessment and Plan:  Problem List Items Addressed This Visit   None   No follow-ups on file.    Leita HILARIO Adie, MD [facility] [place]           [1]  Allergies Allergen Reactions   Penicillin V  Potassium Rash  [2]  Social History Tobacco Use   Smoking status: Never   Smokeless tobacco: Never   Tobacco comments:    smoking cessation materials not required  Vaping Use   Vaping status: Never Used  Substance Use Topics   Alcohol use: Yes    Alcohol/week: 2.0 standard drinks of alcohol    Types: 2 Glasses of wine per week   Drug use: Never  [3]  No outpatient medications have been marked as taking for the 02/22/24 encounter (Orders Only) with Adie Leita DEL, MD.

## 2024-02-22 NOTE — Telephone Encounter (Signed)
 Please advise patient and send RX if appropriate. Thank you.  JM

## 2024-02-24 DIAGNOSIS — R262 Difficulty in walking, not elsewhere classified: Secondary | ICD-10-CM | POA: Diagnosis not present

## 2024-02-24 DIAGNOSIS — Z96652 Presence of left artificial knee joint: Secondary | ICD-10-CM | POA: Diagnosis not present

## 2024-02-24 DIAGNOSIS — M25562 Pain in left knee: Secondary | ICD-10-CM | POA: Diagnosis not present

## 2024-02-24 NOTE — Progress Notes (Signed)
 Chief Complaint  Patient presents with   Medicare Wellness     Subjective:   Heidi Powell is a 73 y.o. female who presents for a Medicare Annual Wellness Visit.  Visit info / Clinical Intake: Medicare Wellness Visit Type:: Subsequent Annual Wellness Visit Persons participating in visit and providing information:: patient Medicare Wellness Visit Mode:: Video Since this visit was completed virtually, some vitals may be partially provided or unavailable. Missing vitals are due to the limitations of the virtual format.: Unable to obtain vitals - no equipment If Telephone or Video please confirm:: I connected with patient using audio/video enable telemedicine. I verified patient identity with two identifiers, discussed telehealth limitations, and patient agreed to proceed. Patient Location:: home Provider Location:: office Interpreter Needed?: No Pre-visit prep was completed: yes AWV questionnaire completed by patient prior to visit?: no Living arrangements:: (!) lives alone Patient's Overall Health Status Rating: very good Typical amount of pain: some (knee pain) Does pain affect daily life?: no Are you currently prescribed opioids?: no  Dietary Habits and Nutritional Risks How many meals a day?: 3 Eats fruit and vegetables daily?: yes Most meals are obtained by: preparing own meals In the last 2 weeks, have you had any of the following?: none Diabetic:: no  Functional Status Activities of Daily Living (to include ambulation/medication): Independent Ambulation: Independent Medication Administration: Independent Home Management (perform basic housework or laundry): Independent Manage your own finances?: yes Primary transportation is: driving Concerns about vision?: no *vision screening is required for [facility]* (glasses to drive, readers- Dr.Nice) Concerns about hearing?: no  Fall Screening Falls in the past year?: 0 Number of falls in past year: 0 Was there an  injury with Fall?: 0 Fall Risk Category Calculator: 0 Patient Fall Risk Level: Low Fall Risk  Fall Risk Patient at Risk for Falls Due to: No Fall Risks Fall risk Follow up: Falls evaluation completed; Falls prevention discussed  Home and Transportation Safety: All rugs have non-skid backing?: (!) no All stairs or steps have railings?: yes Grab bars in the bathtub or shower?: (!) no Have non-skid surface in bathtub or shower?: yes Good home lighting?: yes Regular seat belt use?: yes Hospital stays in the last year:: (!) yes How many hospital stays:: 1 Reason: knee surgery  Cognitive Assessment Difficulty concentrating, remembering, or making decisions? : no Will 6CIT or Mini Cog be Completed: yes What year is it?: 0 points What month is it?: 0 points Give patient an address phrase to remember (5 components): [address] [place], [place] About what time is it?: 0 points Count backwards from 20 to 1: 0 points Say the months of the year in reverse: 0 points Repeat the address phrase from earlier: 0 points 6 CIT Score: 0 points  Advance Directives (For Healthcare) Does Patient Have a Medical Advance Directive?: Yes Does patient want to make changes to medical advance directive?: No - Patient declined Type of Advance Directive: Healthcare Power of [place]; Living will Copy of Healthcare Power of Attorney in Chart?: Yes - validated most recent copy scanned in chart (See row information) Copy of Living Will in Chart?: Yes - validated most recent copy scanned in chart (See row information) Would patient like information on creating a medical advance directive?: No - Patient declined  Reviewed/Updated  Reviewed/Updated: Reviewed All (Medical, Surgical, Family, Medications, Allergies, Care Teams, Patient Goals)    Allergies (verified) Penicillin v potassium   Current Medications (verified) Outpatient Encounter Medications as of 02/16/2024  Medication Sig  Multiple Vitamin  (MULTIVITAMIN WITH MINERALS) TABS tablet Take 1 tablet by mouth once a week.   olmesartan  (BENICAR ) 20 MG tablet TAKE 1 TABLET BY MOUTH EVERY DAY   traMADol  (ULTRAM ) 50 MG tablet Take 1-2 tablets (50-100 mg total) by mouth every 6 (six) hours as needed for moderate pain (pain score 4-6).   [DISCONTINUED] atorvastatin  (LIPITOR) 10 MG tablet TAKE 1 TABLET BY MOUTH EVERY DAY   [DISCONTINUED] metoprolol  succinate (TOPROL -XL) 50 MG 24 hr tablet TAKE 1 TABLET BY MOUTH EVERY DAY   [DISCONTINUED] CALCIUM  PO Take 1 tablet by mouth once a week.   [DISCONTINUED] enoxaparin  (LOVENOX ) 40 MG/0.4ML injection Inject 0.4 mLs (40 mg total) into the skin daily.   [DISCONTINUED] Multiple Vitamin (MULTIVITAMIN) tablet Take 1 tablet by mouth daily.   [DISCONTINUED] oxyCODONE  (ROXICODONE ) 5 MG immediate release tablet Take 0.5-1 tablets (2.5-5 mg total) by mouth every 8 (eight) hours as needed for breakthrough pain.   No facility-administered encounter medications on file as of 02/16/2024.    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   BREAST BIOPSY Left 11/20/2020   Procedure: BREAST BIOPSY WITH NEEDLE LOCALIZATION;  Surgeon: Dessa Reyes ORN, MD;  Location: [facility];  Service: General;  Laterality: Left;   BREAST EXCISIONAL BIOPSY Left 11/20/2020   excision-COMPLEX SCLEROSING LESION.   COLONOSCOPY  08/21/2008   normal   FRACTURE SURGERY Right    arm no hardware   TOTAL KNEE ARTHROPLASTY Left 12/15/2023   Procedure: ARTHROPLASTY, KNEE, TOTAL;  Surgeon: Lorelle Hussar, MD;  Location: [facility];  Service: Orthopedics;  Laterality: Left;   TUBAL  LIGATION     Family History  Problem Relation Age of Onset   Hypertension Mother    Renal cancer Mother    Stroke Mother    Heart disease Father    Hypertension Father    Stroke Brother    Breast cancer Neg Hx    Social History   Occupational History   Not on file  Tobacco Use   Smoking status: Never   Smokeless tobacco: Never   Tobacco comments:    smoking cessation materials not required  Vaping Use   Vaping status: Never Used  Substance and Sexual Activity   Alcohol use: Yes    Alcohol/week: 2.0 standard drinks of alcohol    Types: 2 Glasses of wine per week   Drug use: Never   Sexual activity: Not Currently    Partners: Male    Birth control/protection: None   Tobacco Counseling Counseling given: Not Answered Tobacco comments: smoking cessation materials not required  SDOH Screenings   Food Insecurity: No Food Insecurity (02/16/2024)  Housing: Unknown (02/16/2024)  Transportation Needs: No Transportation Needs (02/16/2024)  Utilities: Not At Risk (02/16/2024)  Alcohol Screen: Low Risk (01/06/2024)  Depression (PHQ2-9): Low Risk (02/21/2024)  Recent Concern: Depression (PHQ2-9) - Medium Risk (11/30/2023)  Financial Resource Strain: Low Risk  (01/26/2024)   Received [facility]  Physical Activity: Insufficiently Active (02/16/2024)  Social Connections: Moderately Isolated (02/16/2024)  Stress: No Stress Concern Present (02/16/2024)  Tobacco Use: Low Risk (02/21/2024)  Health  Literacy: Adequate Health Literacy (02/16/2024)   See flowsheets for full screening details  Depression Screen PHQ 2 & 9 Depression Scale- Over the past 2 weeks, how often have you been bothered by any of the following problems? Little interest or pleasure in doing things: 0 Feeling down, depressed, or hopeless (PHQ Adolescent also includes...irritable): 0 PHQ-2 Total Score: 0 Trouble falling or staying asleep, or sleeping too much: 0 Feeling tired or having  little energy: 0 Poor appetite or overeating (PHQ Adolescent also includes...weight loss): 0 Feeling bad about yourself - or that you are a failure or have let yourself or your family down: 0 Trouble concentrating on things, such as reading the newspaper or watching television (PHQ Adolescent also includes...like school work): 0 Moving or speaking so slowly that other people could have noticed. Or the opposite - being so fidgety or restless that you have been moving around a lot more than usual: 0 Thoughts that you would be better off dead, or of hurting yourself in some way: 0 PHQ-9 Total Score: 0 If you checked off any problems, how difficult have these problems made it for you to do your work, take care of things at home, or get along with other people?: Not difficult at all  Depression Treatment Depression Interventions/Treatment : EYV7-0 Score <4 Follow-up Not Indicated     Goals Addressed             This Visit's Progress    DIET - INCREASE WATER  INTAKE               Objective:    There were no vitals filed for this visit. There is no height or weight on file to calculate BMI.  Hearing/Vision screen Hearing Screening - Comments:: NO AIDS  Vision Screening - Comments:: GLASSES FOR DRIVING, READERS- DR.NICE Immunizations and Health Maintenance Health Maintenance  Topic Date Due   Bone Density Scan  03/05/2024   Mammogram  03/21/2024   COVID-19 Vaccine (3 - 2025-26 season) 03/08/2024 (Originally 11/08/2023)   Colonoscopy  02/20/2029 (Originally 08/22/2018)   COLON CANCER SCREENING ANNUAL FOBT  01/17/2025   Medicare Annual Wellness (AWV)  02/15/2025   DTaP/Tdap/Td (2 - Td or Tdap) 10/26/2030   Pneumococcal Vaccine: 50+ Years  Completed   Influenza Vaccine  Completed   Hepatitis C Screening  Completed   Zoster Vaccines- Shingrix   Completed   Meningococcal B Vaccine  Aged Out        Assessment/Plan:  This is a routine wellness examination for [place].  Patient  Care Team: Justus Leita DEL, MD as PCP - General (Internal Medicine) Hester Wolm PARAS, MD as Consulting Physician (Cardiology) Cathlyn Seal, MD (Dermatology) Dessa Reyes ORN, MD as Consulting Physician (General Surgery) Sharron Cordella ORN, OD (Optometry) Laurice Francis NOVAK, OD [facility])  I have personally reviewed and noted the following in the patients chart:   Medical and social history Use of alcohol, tobacco or illicit drugs  Current medications and supplements including opioid prescriptions. Functional ability and status Nutritional status Physical activity Advanced directives List of other physicians Hospitalizations, surgeries, and ER visits in previous 12 months Vitals Screenings to include cognitive, depression, and falls Referrals and appointments  Orders Placed This Encounter  Procedures   DG Bone Density    Standing Status:   Future    Expiration Date:   02/15/2025    Reason for Exam (SYMPTOM  OR DIAGNOSIS REQUIRED):   SCREENING FOR OSTEPOROSIS    Preferred imaging location?:   [facility] [place]  Release to patient:   Immediate   In addition, I have reviewed and discussed with patient certain preventive protocols, quality metrics, and best practice recommendations. A written personalized care plan for preventive services as well as general preventive health recommendations were provided to patient.   Jhonnie GORMAN Das, LPN   [id number]   Return in 1 year (on 02/15/2025).  After Visit Summary: (MyChart) Due to this being a telephonic visit, the after visit summary with patients personalized plan was offered to patient via MyChart   Nurse Notes: NEEDS FLU SHOTS; MAMMOGRAM SCHEDULED FOR 03/22/24; UTD FOBT; ORDERED BDS

## 2024-03-22 ENCOUNTER — Ambulatory Visit

## 2024-03-28 ENCOUNTER — Ambulatory Visit
Admission: RE | Admit: 2024-03-28 | Discharge: 2024-03-28 | Disposition: A | Discharge status: Home / Self Care | Source: Ambulatory Visit | Attending: Internal Medicine | Admitting: Internal Medicine

## 2024-03-28 ENCOUNTER — Ambulatory Visit
Admission: RE | Admit: 2024-03-28 | Discharge: 2024-03-28 | Disposition: A | Source: Ambulatory Visit | Attending: Internal Medicine | Admitting: Internal Medicine

## 2024-03-28 DIAGNOSIS — Z1231 Encounter for screening mammogram for malignant neoplasm of breast: Secondary | ICD-10-CM | POA: Diagnosis present

## 2024-03-28 DIAGNOSIS — Z780 Asymptomatic menopausal state: Secondary | ICD-10-CM | POA: Insufficient documentation

## 2024-08-21 ENCOUNTER — Ambulatory Visit: Admitting: Student

## 2025-03-08 ENCOUNTER — Ambulatory Visit
